# Patient Record
Sex: Female | Born: 1980 | Race: White | Hispanic: Yes | Marital: Married | Smoking: Never smoker
Health system: Southern US, Community
[De-identification: ages and names within clinical notes are randomized; demographics above are authoritative.]

## PROBLEM LIST (undated history)

## (undated) ENCOUNTER — Inpatient Hospital Stay (HOSPITAL_COMMUNITY): Payer: Self-pay

## (undated) DIAGNOSIS — R0600 Dyspnea, unspecified: Secondary | ICD-10-CM

## (undated) DIAGNOSIS — O24419 Gestational diabetes mellitus in pregnancy, unspecified control: Secondary | ICD-10-CM

## (undated) DIAGNOSIS — M25569 Pain in unspecified knee: Secondary | ICD-10-CM

## (undated) DIAGNOSIS — I10 Essential (primary) hypertension: Secondary | ICD-10-CM

## (undated) DIAGNOSIS — G4700 Insomnia, unspecified: Secondary | ICD-10-CM

## (undated) DIAGNOSIS — O039 Complete or unspecified spontaneous abortion without complication: Secondary | ICD-10-CM

## (undated) DIAGNOSIS — T4145XA Adverse effect of unspecified anesthetic, initial encounter: Secondary | ICD-10-CM

## (undated) DIAGNOSIS — E119 Type 2 diabetes mellitus without complications: Secondary | ICD-10-CM

## (undated) DIAGNOSIS — T8859XA Other complications of anesthesia, initial encounter: Secondary | ICD-10-CM

## (undated) DIAGNOSIS — E0590 Thyrotoxicosis, unspecified without thyrotoxic crisis or storm: Secondary | ICD-10-CM

## (undated) HISTORY — DX: Pain in unspecified knee: M25.569

## (undated) HISTORY — DX: Gestational diabetes mellitus in pregnancy, unspecified control: O24.419

## (undated) HISTORY — DX: Type 2 diabetes mellitus without complications: E11.9

---

## 2008-05-21 ENCOUNTER — Emergency Department (HOSPITAL_COMMUNITY): Admission: EM | Admit: 2008-05-21 | Discharge: 2008-05-21 | Payer: Self-pay | Admitting: Emergency Medicine

## 2009-05-05 ENCOUNTER — Emergency Department (HOSPITAL_COMMUNITY): Admission: EM | Admit: 2009-05-05 | Discharge: 2009-05-05 | Payer: Self-pay | Admitting: Emergency Medicine

## 2009-09-19 ENCOUNTER — Emergency Department (HOSPITAL_COMMUNITY): Admission: EM | Admit: 2009-09-19 | Discharge: 2009-09-20 | Payer: Self-pay | Admitting: Emergency Medicine

## 2009-09-21 ENCOUNTER — Emergency Department (HOSPITAL_COMMUNITY): Admission: EM | Admit: 2009-09-21 | Discharge: 2009-09-21 | Payer: Self-pay | Admitting: Emergency Medicine

## 2010-06-01 ENCOUNTER — Emergency Department (HOSPITAL_COMMUNITY): Admission: EM | Admit: 2010-06-01 | Discharge: 2010-06-01 | Payer: Self-pay | Admitting: Emergency Medicine

## 2011-02-10 LAB — COMPREHENSIVE METABOLIC PANEL
ALT: 41 U/L — ABNORMAL HIGH (ref 0–35)
AST: 39 U/L — ABNORMAL HIGH (ref 0–37)
Alkaline Phosphatase: 69 U/L (ref 39–117)
Calcium: 9.3 mg/dL (ref 8.4–10.5)
GFR calc non Af Amer: >60 mL/min (ref >60)
Glucose, Bld: 111 mg/dL — ABNORMAL HIGH (ref 70–99)
Potassium: 3.9 mEq/L (ref 3.5–5.1)
Total Bilirubin: 0.3 mg/dL (ref 0.3–1.2)

## 2011-02-10 LAB — CBC
HCT: 42 % (ref 36.0–46.0)
Hemoglobin: 14.6 g/dL (ref 12.0–15.0)
MCH: 28.3 pg (ref 26.0–34.0)
MCHC: 34.7 g/dL (ref 30.0–36.0)
MCV: 81.6 fL (ref 78.0–100.0)
Platelets: 378 K/uL (ref 150–400)
RBC: 5.15 MIL/uL — ABNORMAL HIGH (ref 3.87–5.11)
RDW: 13.2 % (ref 11.5–15.5)
WBC: 10.8 10*3/uL — ABNORMAL HIGH (ref 4.0–10.5)

## 2011-02-10 LAB — URINALYSIS, ROUTINE W REFLEX MICROSCOPIC
Bilirubin Urine: NEGATIVE
Glucose, UA: NEGATIVE mg/dL
Hgb urine dipstick: NEGATIVE
Ketones, ur: NEGATIVE mg/dL
Nitrite: NEGATIVE
Protein, ur: NEGATIVE mg/dL
Specific Gravity, Urine: 1.025 (ref 1.005–1.030)
Urobilinogen, UA: 1 mg/dL (ref 0.0–1.0)
pH: 6 (ref 5.0–8.0)

## 2011-02-10 LAB — COMPREHENSIVE METABOLIC PANEL WITH GFR
Albumin: 4 g/dL (ref 3.5–5.2)
BUN: 14 mg/dL (ref 6–23)
CO2: 26 meq/L (ref 19–32)
Chloride: 102 meq/L (ref 96–112)
Creatinine, Ser: 0.61 mg/dL (ref 0.4–1.2)
GFR calc Af Amer: >60 mL/min (ref >60)
Sodium: 136 meq/L (ref 135–145)
Total Protein: 8.4 g/dL — ABNORMAL HIGH (ref 6.0–8.3)

## 2011-02-10 LAB — DIFFERENTIAL
Basophils Absolute: 0.1 10*3/uL (ref 0.0–0.1)
Basophils Relative: 1 % (ref 0–1)
Eosinophils Absolute: 0.2 K/uL (ref 0.0–0.7)
Eosinophils Relative: 2 % (ref 0–5)
Lymphocytes Relative: 38 % (ref 12–46)
Lymphs Abs: 4.1 10*3/uL — ABNORMAL HIGH (ref 0.7–4.0)
Monocytes Absolute: 0.8 K/uL (ref 0.1–1.0)
Monocytes Relative: 7 % (ref 3–12)
Neutro Abs: 5.7 10*3/uL (ref 1.7–7.7)
Neutrophils Relative %: 53 % (ref 43–77)

## 2011-02-10 LAB — GLUCOSE, CAPILLARY: Glucose-Capillary: 200 mg/dL — ABNORMAL HIGH (ref 70–99)

## 2011-02-10 LAB — LIPASE, BLOOD: Lipase: 20 U/L (ref 11–59)

## 2011-02-10 LAB — PREGNANCY, URINE: Preg Test, Ur: NEGATIVE

## 2011-03-04 LAB — URINALYSIS, ROUTINE W REFLEX MICROSCOPIC
Hgb urine dipstick: NEGATIVE
Ketones, ur: NEGATIVE mg/dL
Protein, ur: NEGATIVE mg/dL
Specific Gravity, Urine: 1.024 (ref 1.005–1.030)
Urobilinogen, UA: 0.2 mg/dL (ref 0.0–1.0)

## 2011-12-29 ENCOUNTER — Emergency Department (HOSPITAL_COMMUNITY)
Admission: EM | Admit: 2011-12-29 | Discharge: 2011-12-29 | Disposition: A | Discharge status: Home / Self Care | Payer: Self-pay | Attending: Emergency Medicine | Admitting: Emergency Medicine

## 2011-12-29 ENCOUNTER — Encounter (HOSPITAL_COMMUNITY): Payer: Self-pay | Admitting: *Deleted

## 2011-12-29 ENCOUNTER — Emergency Department (HOSPITAL_COMMUNITY): Payer: Self-pay

## 2011-12-29 DIAGNOSIS — IMO0002 Reserved for concepts with insufficient information to code with codable children: Secondary | ICD-10-CM | POA: Insufficient documentation

## 2011-12-29 DIAGNOSIS — N949 Unspecified condition associated with female genital organs and menstrual cycle: Secondary | ICD-10-CM | POA: Insufficient documentation

## 2011-12-29 DIAGNOSIS — R109 Unspecified abdominal pain: Secondary | ICD-10-CM | POA: Insufficient documentation

## 2011-12-29 DIAGNOSIS — Z3201 Encounter for pregnancy test, result positive: Secondary | ICD-10-CM | POA: Insufficient documentation

## 2011-12-29 DIAGNOSIS — R10819 Abdominal tenderness, unspecified site: Secondary | ICD-10-CM | POA: Insufficient documentation

## 2011-12-29 HISTORY — DX: Essential (primary) hypertension: I10

## 2011-12-29 HISTORY — DX: Complete or unspecified spontaneous abortion without complication: O03.9

## 2011-12-29 LAB — URINALYSIS, ROUTINE W REFLEX MICROSCOPIC
Leukocytes, UA: NEGATIVE
Nitrite: NEGATIVE
Protein, ur: NEGATIVE mg/dL
Specific Gravity, Urine: 1.025 (ref 1.005–1.030)

## 2011-12-29 LAB — WET PREP, GENITAL: Trich, Wet Prep: NONE SEEN

## 2011-12-29 LAB — URINE MICROSCOPIC-ADD ON

## 2011-12-29 LAB — PREGNANCY, URINE: Preg Test, Ur: POSITIVE — AB

## 2011-12-29 LAB — ABO/RH: ABO/RH(D): O POS

## 2011-12-29 MED ORDER — PRENATAL RX 60-1 MG PO TABS: 1.0000 | ORAL_TABLET | Freq: Every day | ORAL | Status: AC

## 2011-12-29 NOTE — ED Notes (Signed)
Pt from home c/o lower abdominal pain for about 15 days, denies N/V/D, fever, recent surgeries or injuries. Pt reports taking a pregnancy test 2 days ago that was positive and took another test today that was negative which is when vaginal spotting started.

## 2011-12-29 NOTE — ED Provider Notes (Signed)
History     CSN: [id number]  Arrival date & time 12/29/11  1247   First MD Initiated Contact with Patient 12/29/11 1312      Chief Complaint  Patient presents with  . Abdominal Pain    (Consider location/radiation/quality/duration/timing/severity/associated sxs/prior treatment) The history is provided by the patient. A language interpreter was used.    [id number] 31 year old female with history of prior miscarriage is presenting to the ED with chief complaints of abdominal pain. History was obtained through a language interpreter. Patient states her last menstruation was in mid December. For the past 15 days he has been experiencing low abdominal pain affecting both left and right side. Pain is described as a intermittent throbbing sensation worsening with movement and relief on its own.  2 days ago she had a positive urine pregnancy test. She noticed some vaginal spotting without clots today and she would check a pregnancy test that was negative this time. Patient denies fever, chest pain, shortness of breath, nausea, vomiting, back pain. She does complain of some mild burning urination without urinary frequency or urgency. She denies vaginal discharge. She denies any recent trauma.  Past Medical History  Diagnosis Date  . Miscarriage   . Hypertension     Past Surgical History  Procedure Date  . Cesarean section     History reviewed. No pertinent family history.  History  Substance Use Topics  . Smoking status: Never Smoker   . Smokeless tobacco: Never Used  . Alcohol Use: Yes     socially    OB History    Grav Para Term Preterm Abortions TAB SAB Ect Mult Living                  Review of Systems  All other systems reviewed and are negative.    Allergies  Review of patient's allergies indicates no known allergies.  Home Medications  No current outpatient prescriptions on file.  BP 141/73  Pulse 84  Temp(Src) 98.9 F (37.2 C) (Oral)  Resp 18  Wt 255 lb (115.667  kg)  SpO2 99%  LMP 11/07/2011  Physical Exam  Nursing note and vitals reviewed. Constitutional: She appears well-developed and well-nourished. No distress.       Awake, alert, nontoxic appearance. Morbidly obese  HENT:  Head: Normocephalic and atraumatic.  Eyes: Conjunctivae are normal. Right eye exhibits no discharge. Left eye exhibits no discharge.  Neck: Normal range of motion. Neck supple.  Cardiovascular: Normal rate and regular rhythm.   Pulmonary/Chest: Effort normal and breath sounds normal. No respiratory distress. She exhibits no tenderness.  Abdominal: Soft. There is tenderness in the suprapubic area. There is no rigidity, no rebound, no guarding, no CVA tenderness, no tenderness at McBurney's point and negative Murphy's sign.  Genitourinary: Vagina normal and uterus normal. There is no rash or lesion on the right labia. There is no rash or lesion on the left labia. Cervix exhibits no motion tenderness and no discharge. Right adnexum displays tenderness. Right adnexum displays no mass. Left adnexum displays tenderness. Left adnexum displays no mass. No erythema, tenderness or bleeding around the vagina. No vaginal discharge found.       The cervical os is closed, but noted no of conception. Patient is mildly tender throughout both adnexa difficult to appreciate mass due to large body habitus. Mild vaginal discharge noted.  Musculoskeletal: She exhibits no tenderness.       ROM appears intact, no obvious focal weakness  Lymphadenopathy:  Right: No inguinal adenopathy present.       Left: No inguinal adenopathy present.  Neurological:       Mental status and motor strength appears intact  Skin: No rash noted.  Psychiatric: She has a normal mood and affect.    ED Course  Procedures (including critical care time)  Labs Reviewed - No data to display No results found.   No diagnosis found.  Results for orders placed during the hospital encounter of 12/29/11    URINALYSIS, ROUTINE W REFLEX MICROSCOPIC      Component Value Range   Color, Urine YELLOW  YELLOW    APPearance CLOUDY (*) CLEAR    Specific Gravity, Urine 1.025  1.005 - 1.030    pH 6.0  5.0 - 8.0    Glucose, UA NEGATIVE  NEGATIVE (mg/dL)   Hgb urine dipstick TRACE (*) NEGATIVE    Bilirubin Urine NEGATIVE  NEGATIVE    Ketones, ur NEGATIVE  NEGATIVE (mg/dL)   Protein, ur NEGATIVE  NEGATIVE (mg/dL)   Urobilinogen, UA 1.0  0.0 - 1.0 (mg/dL)   Nitrite NEGATIVE  NEGATIVE    Leukocytes, UA NEGATIVE  NEGATIVE   PREGNANCY, URINE      Component Value Range   Preg Test, Ur POSITIVE (*) NEGATIVE   WET PREP, GENITAL      Component Value Range   Yeast Wet Prep HPF POC NONE SEEN  NONE SEEN    Trich, Wet Prep NONE SEEN  NONE SEEN    Clue Cells Wet Prep HPF POC NONE SEEN  NONE SEEN    WBC, Wet Prep HPF POC MODERATE (*) NONE SEEN   HCG, QUANTITATIVE, PREGNANCY      Component Value Range   hCG, Beta Chain, Quant, S 8595 (*) <5 (mIU/mL)  ABO/RH      Component Value Range   ABO/RH(D) O POS    URINE MICROSCOPIC-ADD ON      Component Value Range   Squamous Epithelial / LPF FEW (*) RARE    WBC, UA 0-2  <3 (WBC/hpf)   RBC / HPF 0-2  <3 (RBC/hpf)   Bacteria, UA RARE  RARE    Urine-Other MUCOUS PRESENT     US Ob Comp Less 14 Wks  12/29/2011  *RADIOLOGY REPORT*  Clinical Data: Abdominal and pelvic pain.  7-week-3-day gestational age by LMP.  OBSTETRIC <14 WK [place] AND TRANSVAGINAL OB US  Technique:  Both transabdominal and transvaginal ultrasound examinations were performed for complete evaluation of the gestation as well as the maternal uterus, adnexal regions, and pelvic cul-de-sac.  Transvaginal technique was performed to assess early pregnancy.  Comparison:  None.  Intrauterine gestational sac:  Visualized/normal in shape. Yolk sac: Visualized Embryo: Visualized Cardiac Activity: Observed Heart Rate: 120 bpm  CRL: 3   mm  5   w  6   d           [place] EDC: 08/24/2012  Maternal uterus/adnexae: No  subchorionic hemorrhage identified.  No fibroids or other uterine mass identified. A 3 cm right ovarian corpus luteum cyst is seen.  Left ovary appears normal.  No adnexal mass or free fluid identified.  IMPRESSION:  1.  Single living IUP measuring 5 weeks 6 days with [place] EDC of 08/24/2012. 2.  No significant maternal uterine or adnexal abnormality identified.  Original Report Authenticated By: Danae Orleans, M.D.   US Ob Transvaginal  12/29/2011  *RADIOLOGY REPORT*  Clinical Data: Abdominal and pelvic pain.  7-week-3-day gestational age by LMP.  OBSTETRIC <14 WK [place] AND TRANSVAGINAL OB US  Technique:  Both transabdominal and transvaginal ultrasound examinations were performed for complete evaluation of the gestation as well as the maternal uterus, adnexal regions, and pelvic cul-de-sac.  Transvaginal technique was performed to assess early pregnancy.  Comparison:  None.  Intrauterine gestational sac:  Visualized/normal in shape. Yolk sac: Visualized Embryo: Visualized Cardiac Activity: Observed Heart Rate: 120 bpm  CRL: 3   mm  5   w  6   d           [place] EDC: 08/24/2012  Maternal uterus/adnexae: No subchorionic hemorrhage identified.  No fibroids or other uterine mass identified. A 3 cm right ovarian corpus luteum cyst is seen.  Left ovary appears normal.  No adnexal mass or free fluid identified.  IMPRESSION:  1.  Single living IUP measuring 5 weeks 6 days with [place] EDC of 08/24/2012. 2.  No significant maternal uterine or adnexal abnormality identified.  Original Report Authenticated By: Danae Orleans, M.D.      MDM  upreg positive.  Tenderneness noted diffusely on pelvic exam.  Wet prep shows moderate WBC but no other positive result.  I have discussed with my attending, who felt that PID is less likely in the setting of pregnancy.  No abx treatment at this time.    4:08 PM Transvaginal ultrasound reveals a single living intrauterine pregnancy estimated to be 5 weeks and 6 days. No other significant  finding or adnexal abnormality identified.  2 were discussed with the patient. Patient will be prescribed multivitamins and a followup with OBGYN.  Pt in no acute distress.        Fayrene Helper, PA-C 12/29/11 1610

## 2011-12-29 NOTE — ED Notes (Signed)
Ultrasound at bedside with patient. 

## 2011-12-29 NOTE — ED Notes (Signed)
Patient given discharge instructions, information, prescriptions, and diet order. Patient states that they adequately understand discharge information given and to return to ED if symptoms return or worsen.    [facility] given contact information

## 2011-12-29 NOTE — ED Notes (Signed)
Pt speaks no [place], [place], NT in to translate.

## 2012-01-01 NOTE — ED Notes (Signed)
Rx changed to Prenatal 27 mg

## 2012-01-02 NOTE — ED Provider Notes (Signed)
Medical screening examination/treatment/procedure(s) were conducted as a shared visit with non-physician practitioner(s) and myself.  I personally evaluated the patient during the encounter Pos preg test, u/s w iup. abd soft nt.   Suzi Roots, MD 01/02/12 [phone]

## 2012-04-06 ENCOUNTER — Other Ambulatory Visit (HOSPITAL_COMMUNITY): Payer: Self-pay | Admitting: Family

## 2012-04-06 DIAGNOSIS — Z0489 Encounter for examination and observation for other specified reasons: Secondary | ICD-10-CM

## 2012-04-06 LAB — OB RESULTS CONSOLE PLATELET COUNT: Platelets: 342 10*3/uL

## 2012-04-06 LAB — OB RESULTS CONSOLE RUBELLA ANTIBODY, IGM: Rubella: IMMUNE

## 2012-04-06 LAB — OB RESULTS CONSOLE HGB/HCT, BLOOD: Hemoglobin: 10.8 g/dL

## 2012-04-06 LAB — OB RESULTS CONSOLE GC/CHLAMYDIA
Chlamydia: NEGATIVE
Gonorrhea: NEGATIVE

## 2012-04-06 LAB — OB RESULTS CONSOLE HIV ANTIBODY (ROUTINE TESTING): HIV: NONREACTIVE

## 2012-04-06 LAB — OB RESULTS CONSOLE RPR: RPR: NONREACTIVE

## 2012-04-10 ENCOUNTER — Ambulatory Visit (HOSPITAL_COMMUNITY)
Admission: RE | Admit: 2012-04-10 | Discharge: 2012-04-10 | Disposition: A | Discharge status: Home / Self Care | Payer: Medicaid Other | Source: Ambulatory Visit | Attending: Family | Admitting: Family

## 2012-04-10 DIAGNOSIS — Z0489 Encounter for examination and observation for other specified reasons: Secondary | ICD-10-CM

## 2012-04-10 DIAGNOSIS — O99810 Abnormal glucose complicating pregnancy: Secondary | ICD-10-CM | POA: Insufficient documentation

## 2012-04-10 DIAGNOSIS — E669 Obesity, unspecified: Secondary | ICD-10-CM | POA: Insufficient documentation

## 2012-04-10 DIAGNOSIS — O34219 Maternal care for unspecified type scar from previous cesarean delivery: Secondary | ICD-10-CM | POA: Insufficient documentation

## 2012-04-10 DIAGNOSIS — O358XX0 Maternal care for other (suspected) fetal abnormality and damage, not applicable or unspecified: Secondary | ICD-10-CM | POA: Insufficient documentation

## 2012-04-10 DIAGNOSIS — Z363 Encounter for antenatal screening for malformations: Secondary | ICD-10-CM | POA: Insufficient documentation

## 2012-04-10 DIAGNOSIS — Z1389 Encounter for screening for other disorder: Secondary | ICD-10-CM | POA: Insufficient documentation

## 2012-04-10 DIAGNOSIS — O99210 Obesity complicating pregnancy, unspecified trimester: Secondary | ICD-10-CM | POA: Insufficient documentation

## 2012-04-10 LAB — US OB DETAIL + 14 WK

## 2012-04-14 DIAGNOSIS — O99810 Abnormal glucose complicating pregnancy: Secondary | ICD-10-CM | POA: Insufficient documentation

## 2012-04-14 DIAGNOSIS — Z98891 History of uterine scar from previous surgery: Secondary | ICD-10-CM | POA: Insufficient documentation

## 2012-04-15 ENCOUNTER — Ambulatory Visit (INDEPENDENT_AMBULATORY_CARE_PROVIDER_SITE_OTHER): Payer: Self-pay | Admitting: Advanced Practice Midwife

## 2012-04-15 ENCOUNTER — Encounter: Payer: Self-pay | Admitting: Advanced Practice Midwife

## 2012-04-15 DIAGNOSIS — R030 Elevated blood-pressure reading, without diagnosis of hypertension: Secondary | ICD-10-CM | POA: Insufficient documentation

## 2012-04-15 DIAGNOSIS — O0990 Supervision of high risk pregnancy, unspecified, unspecified trimester: Secondary | ICD-10-CM | POA: Insufficient documentation

## 2012-04-15 DIAGNOSIS — O99810 Abnormal glucose complicating pregnancy: Secondary | ICD-10-CM

## 2012-04-15 LAB — COMPREHENSIVE METABOLIC PANEL
AST: 16 U/L (ref 0–37)
Albumin: 3.9 g/dL (ref 3.5–5.2)
Alkaline Phosphatase: 60 U/L (ref 39–117)
Calcium: 9.5 mg/dL (ref 8.4–10.5)
Chloride: 102 mEq/L (ref 96–112)
Potassium: 4.2 mEq/L (ref 3.5–5.3)
Sodium: 135 mEq/L (ref 135–145)
Total Protein: 7 g/dL (ref 6.0–8.3)

## 2012-04-15 LAB — POCT URINALYSIS DIP (DEVICE)
Bilirubin Urine: NEGATIVE
Glucose, UA: NEGATIVE mg/dL
Hgb urine dipstick: NEGATIVE
Specific Gravity, Urine: 1.02 (ref 1.005–1.030)
Urobilinogen, UA: 0.2 mg/dL (ref 0.0–1.0)
pH: 5.5 (ref 5.0–8.0)

## 2012-04-15 NOTE — Progress Notes (Signed)
Pulse 104 Patient reports pelvic pain

## 2012-04-15 NOTE — Patient Instructions (Signed)
Gua de planeamiento de la alimentacin para diabticos (Diabetes Meal Planning Guide) La gua de planeamiento de alimentacin para diabticos es una herramienta para ayudarlo a planear sus comidas y colaciones. Es importante para las personas con diabetes controlar sus niveles de International aid/development worker. Elegir los [employer] correctos y las cantidades adecuadas durante el da le ayudar a Technical brewer. Comer bien puede incluso ayudarlo a mejorar la presin sangunea y Barista o Pharmacologist un peso saludable. CUENTE LOS HIDRATOS DE CARBONO CON FACILIDAD Cuando consume hidratos de carbono, stos se transforman en azcar (glucosa). Esto a su vez Counsellor de Production assistant, radio. El conteo de carbohidratos puede ayudarlo a Chief Operating Officer este nivel para que se sienta mejor. Al planear sus alimentos con el conteo de carbohidratos, podr tener ms flexibilidad en lo que come y Physiological scientist con el consumo de alimentos. El conteo de carbohidratos significa simplemente sumar la cantidad total de gramos de carbohidratos a sus comidas o colaciones. Trate de consumir la misma cantidad en cada comida. A continuacin encontrar una lista de 1 porcin o 15 gr. de carbohidratos. A continuacin se enumeran. Pregunte al mdico cuntos gramos de carbohidratos necesita comer en cada comida o colacin. Almidones y granos  1 [place] de pan.    bollo ingls o bollo para hamburguesa o hotdog.    taza de cereal fro (sin azcar).   ? taza de pasta o arroz cocido.    taza de vegetales que contengan almidn (maz, papas, arvejas, porotos, calabaza).   1 omelette (6 pulgadas).    bollo.   1 waffle o panqueque (del tamao de un CD).    taza de cereales cocidos.   4 a 6 galletas saldas pequeas.  *Se recomienda el consumo de granos enteros. Frutas  1 taza de frutos rojos, meln, papaya o anan sin azcar.   1 fruta fresca pequea.    banana o mango.    taza de jugo de frutas (4 onzas sin  endulzar).    taza de fruta envasada en jugo natural o agua.   2 cucharadas de frutas secas.   12-15 uvas o cerezas.  Leche y yogurt  1 taza de [employer] o al 1%.   1 taza de leche de soja.   6 onzas de yogurt descremado con edulcorante sin azcar.   6 onzas de yogur descremado de soja.   6 onzas de yogur natural.  Vegetales  1 taza de vegetales crudos o  de vegetales cocidos se considera cero carbohidratos o una comida "libre".   Si come 3 o ms porciones en una comida, cuntelas como 1 porcin de carbohidratos.  Otros carbohidratos   onzas de chips o pretzels.    taza de helado de crema o yogur helado.    taza de helado de agua.   5 cm de torta no congelada.   1 cucharada de miel, azcar, mermelada, jalea o almbar.   2 galletitas dulces pequeas.   3 cuadrados de crackers de graham.   3 tazas de palomitas de maz.   6 crackers.   1 taza de caldo.   Cuente 1 taza de guisado u otra mezcla de alimentos como 2 porciones de carbohidratos.   Los alimentos con menos de 20 caloras por porcin deben contarse como cero carbohidratos o alimento "libre".  Si lo desea compre un libro o software de computacin que enumere la cantidad de gramos de carbohidratos de los diferentes alimentos. Adems, el panel nutricional en las etiquetas de los  productos que consume es una buena fuente de informacin. Le indicar el tamao de la porcin y la cantidad total de carbohidratos que consumir por cada una. Divida este nmero por 15 para obtener el nmero de conteo de carbohidratos por porcin. Recuerde: cada porcin son 60 gramos de carbohidratos. PORCIONES La medicin de los alimentos y el tamao de las porciones lo ayudarn a Scientist, physiological cantidad exacta de comida que debe ingerir. La lista que sigue le mostrar el tamao de algunas porciones comunes.   1 onza.................4 dados apilados.   3 onzas..............Marland KitchenUn mazo de cartas.   1 cucharadita...Marland KitchenMarland KitchenLa punta de  un dedo pequeo.   1 cucharada.......Marland KitchenUn dedo.   2 cucharadas....Marland KitchenMarland KitchenUna pelota de golf.    taza..............Marland KitchenLa mitad de un puo.   1 taza...............Marland KitchenUn puo.  EJEMPLO DE PLAN DE ALIMENTACIN PARA DIABTICOS: A continuacin se muestra un ejemplo de plan de alimentacin que incluye comidas de los grupos de granos y [place], Sports administrator, frutas y carnes. Un nutricionista podr confeccionarle un plan individualizado para cubrir sus necesidades calricas y decirle el nmero de porciones que necesita de [place]. Sin embargo, podra [employer] alimentos que contengan carbohidratos (lcteos, cereales y frutas). Controlar la cantidad total de carbohidratos en los alimentos o colaciones es ms importante que asegurarse de incluir todos los grupos alimenticios cada vez que come.  El siguiente plan de alimentacin es un ejemplo de una dieta de 2000 caloras mediante el conteo de carbohidratos. Este plan contiene 17 porciones de carbohidratos. Desayuno  1 taza de avena (2 porciones de carbohidratos).    taza de yogur light(1 porcin de carbohidratos).   1 taza de arndanos (1 porcin de carbohidratos).    taza de almendras.  Colacin  1 manzana grande (2 porciones de carbohidratos).   1 palito de queso bajo [employer].  Almuerzo  Ensalada de pechuga de pollo.   1 taza de espinacas.    taza de tomates cortados.   2 oz (60 gr) de pechuga de pollo en rebanadas.   2 cucharadas de aderezo italiano bajo en [employer].   12 galletas integrales (2 porciones de carbohidratos).   12 a 15 uvas (1 porcin de carbohidratos).   1 taza de [employer] (1porcin de carbohidratos).  Colacin  1 taza de zanahorias.    taza de pur de garbanzos (1 porcin de carbohidratos).  Cena  3 oz (80 gr) de salmn a la parrilla.   1 taza de arroz integral (3 porciones de carbohidratos).  Colacin  1  taza de brcoli al vapor (1 porcin de carbohidrato) con una cucharadita de  aceite de oliva y jugo de limn.   1 taza de budn light (2 porciones de carbohidratos).  HOJA DE PLANEAMIENTO DE LA ALIMENTACIN: El dietista podr utilizar esta hoja para ayudarlo a decidir cuntas porciones y qu tipos de alimentos son los adecuados para usted.  DESAYUNO Grupo de alimentos y porciones / Alimento elegido Granos/Fculas_________________________________________________ Lcteos________________________________________________________ Rufina Falco ______________________________________________________ Lou Miner __________________________________________________________ Charlesetta Ivory _________________________________________________________ Rosalin Hawking _________________________________________________________ Lorin Mercy de alimentos y porciones / Alimento elegido Granos/Fculas___________________________________________________ Lcteos_________________________________________________________ Lou Miner ___________________________________________________________ Charlesetta Ivory __________________________________________________________ Rosalin Hawking __________________________________________________________ [place] de alimentos y porciones / Alimento elegido Granos/Fculas___________________________________________________ Lcteos_________________________________________________________ Lou Miner ___________________________________________________________ Charlesetta Ivory __________________________________________________________ Rosalin Hawking __________________________________________________________ Jettie Pagan de alimentos y porciones / Alimento elegido Granos/Fculas_________________________________________________ Lcteos________________________________________________________ Rufina Falco ______________________________________________________ Lou Miner _________________________________________________________ Charlesetta Ivory ________________________________________________________ Rosalin Hawking  ________________________________________________________ Carolyn Stare DIARIOS Fculas_______________________________________________________ Vegetales _____________________________________________________ Lou Miner ________________________________________________________ Lcteos_______________________________________________________ Carnes________________________________________________________ Rosalin Hawking ________________________________________________________ Document Released: 02/18/2008 Document Revised: 10/31/2011 ExitCare Patient Information 2012 [place] [place].Diabetes mellitus gestacional (Gestational Diabetes Mellitus) La diabetes mellitus gestacional se produce slo  durante el embarazo. Aparece cuando el organismo no puede controlar adecuadamente la glucosa (azcar) que aumenta en la sangre despus de comer. Durante el [place], se produce una resistencia a la insulina (sensibilidad reducida a la insulina) debido a la liberacin de hormonas por parte de la placenta. Generalmente, el pncreas de una mujer embarazada produce la cantidad suficiente de insulina para vencer esa resistencia. Sin embargo, en la diabetes gestacional, hay insulina pero no cumple su funcin adecuadamente. Si la resistencia es lo suficientemente grave como para que el pncreas no produzca la cantidad de insulina suficiente, la glucosa extra se acumula en la sangre.  Devota Pace RIESGO DE DESARROLLAR DIABETES GESTACIONAL?  Las mujeres con historia de diabetes en la familia.   Las mujeres de ms de [address]   Las que presentan sobrepeso.   Las [employer] pertenecen a ciertos grupos tnicos (latinas, afroamericanas, norteamericanas nativas, asiticas y las originarias de las islas del Pacfico.  QUE PUEDE OCURRIRLE AL BEB? Si el nivel de glucosa en sangre de la madre es demasiado elevado mientras este [place], el nivel extra de azcar pasar por el cordn umbilical hacia el beb. Algunos de los problemas del beb  pueden ser:  Beb demasiado grande: si el nio recibe Chief Strategy Officer, puede aumentar mucho de [place]. Esto puede hacer que sea demasiado grande para nacer por parto normal (vaginal) por lo que ser necesario realizar una cesrea.   Bajo nivel de glucosa (hipoglucemia): el beb produce insulina extra en respuesta a la excesiva cantidad de azcar que obtiene de [employer]. Cuando el beb nace y ya no necesita insulina extra, su nivel de azcar en sangre puede disminuir.   Ictericia (coloracin amarillenta de la piel y los ojos): esto es bastante frecuente en los bebs. La causa es la acumulacin de una sustancia qumica denominada bilirrubina. No siempre es un trastorno grave, pero se observa con frecuencia en los bebs cuyas madres sufren diabetes gestacional.  RIESGOS PARA LA MADRE Las mujeres que han sufrido diabetes gestacional pueden tener ms riesgos para algunos problemas como:  Preeclampsia o toxemia, incluyendo problemas con hipertensin arterial. La presin arterial y los niveles de protenas en la orina deben controlarse con frecuencia.   Infecciones   Parto por cesrea.   Aparicin de diabetes tipo 2 en una etapa posterior de la vida. Alrededor del 30% al 50% sufrir diabetes posteriormente, especialmente las que son obesas.  DIAGNSTICO Las hormonas que causan resistencia a la insulina tienen su mayor nivel alrededor de las 24 a 28 semanas del Psychiatrist. Si se experimentan sntomas, stos son similares a los sntomas que normalmente aparecen durante el embarazo.  La diabetes mellitus gestacional generalmente se diagnostica por medio de un mtodo en dos partes: 1. Despus de la 24 a 28 semanas de Psychiatrist, la mujer debe beber una solucin que contiene glucosa y Education officer, environmental un anlisis de [place]. Si el nivel de glucosa es elevado, la realizarn un segundo [place].  2. La prueba oral de tolerancia a la glucosa, que dura aproximadamente tres horas. Despus de realizar ayuno durante la noche,  se controla nivel de glucosa en sangre. La mujer bebe una solucin que contiene glucosa y Chief Executive Officer realizan anlisis de glucosa en sangre cada hora.  Si la mujer tiene factores de riesgos para la diabetes mellitus gestacional, el mdico podr Programme researcher, broadcasting/film/video anlisis antes de las 24 semanas de [place]. TRATAMIENTO El tratamiento est dirigido a Insurance underwriter en sangre de la madre en un nivel normal y puede incluir:  La planificacin de los alimentos.   Recibir insulina u otro medicamento para Sales executive nivel de glucosa en [place].   La prctica de ejercicios.   Llevar un registro diario de los alimentos que consume.   Control y Engineer, maintenance (IT) de los niveles de glucosa en [place].   Control de los niveles de cetona en la [place], [place] esto ya no se considera necesario en la mayora de los [place].  INSTRUCCIONES PARA EL CUIDADO DOMICILIARIO Mientras est embarazada:  Siga los consejos de su mdico relacionados con los controles prenatales, la planificacin de la comida, la actividad fsica, los [place], vitaminas, los anlisis de sangre y otras pruebas y las actividades fsicas.   Lleve un registro de las comidas, las pruebas de glucosa en sangre y la cantidad de insulina que recibe (si corresponde). Muestre todo al profesional en cada consulta mdica prenatal.   Si sufre diabetes mellitus gestacional, podr tener problemas de hipoglucemia (nivel bajo de glucosa en sangre). Podr sospechar este problema si se siente repentinamente mareada, tiene temblores y/o se siente dbil. Si cree que esto le est ocurriendo, y tiene un medidor de glucosa, mida su nivel de Event organiser. Siga los consejos de su mdico sobre el modo y el momento de tratar su nivel de glucosa en sangre. Generalmente se sigue la regla 15:15 Consuma 15 g de hidratos de carbono, espere 15 minutos y Programmer, systems el nivel de glucosa en [place].Barbara Cower de 15 g de hidratos de carbono son:   1 taza de [employer].     taza de jugo.   3-4 tabletas de glucosa.   5-6 caramelos duros.   1 caja pequea de pasas de uva.    taza de gaseosa comn.   Mantenga una buena higiene para evitar infecciones.   No fume.  SOLICITE ATENCIN MDICA SI:  Observa prdida vaginal con o sin picazn.   Se siente ms dbil o cansada que lo habitual.   Primus [place].   Tiene un aumento de peso repentino, 2,5 kg o ms en una semana.   Pierde peso, 1.5 kg o ms en una semana.   Su nivel de glucosa en sangre es elevado, necesita instrucciones.  SOLICITE ATENCIN MDICA DE INMEDIATO SI:  Sufre una cefalea intensa.   Se marea o pierde el conocimiento   Presenta nuseas o vmitos.   Se siente desorientada confundida.   Sufre convulsiones.   Tiene problemas de visin.   Siente Physiological scientist.   Presenta una hemorragia vaginal abundante.   Tiene contracciones uterinas.   Tiene una prdida importante de lquido por la vagina  DESPUS QUE NACE EL BEB:  Concurra a todos los controles de seguimiento y Clinical biochemist los anlisis de sangre segn las indicaciones de su mdico.   Mantenga un estilo de vida saludable para evitar la diabetes en el futuro. Aqu se incluye:   Siga el plan de alimentacin saludable.   Controle su peso.   Practique actividad fsica y descanse lo necesario.   No fume.   Amamante a su beb mientras pueda. Esto disminuir la probabilidad de que usted y su beb sufran diabetes posteriormente.  Para ms informacin acerca de la diabetes, visite la pgina web de Holiday representative Diabetes Association: [email or web address].cy. Para ms informacin acerca de la diabetes gestacional cite la pgina web del [employer] Obst[employer]ians and Gynecologists en: RentRule.com.[email or web address]. Document Released: 08/21/2005 Document Revised: 10/31/2011 [facility] Patient Information 2012 [place], [place].

## 2012-04-15 NOTE — Progress Notes (Signed)
New OB transfer from [facility] for diabetes.  Is doing some diet therapy at home, but has not had training yet and does not have a meter. Already tries to limit sugar and bread/rice.  Does eat a lot of pineapple. Discussed basic diabetic diet. Discussed usual care for GDM and expected testing. Borderline elevation in BP noted. Will do baseline 24 hr urine, CMET and HgbA1C today/tomorrow. Had Anatomy US last week which was normal (SIUP, Placenta Anterior, Cervix 4.04, Anatomy normal).  Will bring back to HR clinic Monday.

## 2012-04-17 LAB — CREATININE CLEARANCE, URINE, 24 HOUR
Creatinine, 24H Ur: 2145 mg/d — ABNORMAL HIGH (ref 700–1800)
Creatinine, Urine: 95.3 mg/dL

## 2012-04-27 ENCOUNTER — Encounter: Payer: Medicaid Other | Attending: Advanced Practice Midwife | Admitting: Dietician

## 2012-04-27 ENCOUNTER — Encounter: Payer: Self-pay | Admitting: Family Medicine

## 2012-04-27 ENCOUNTER — Ambulatory Visit (INDEPENDENT_AMBULATORY_CARE_PROVIDER_SITE_OTHER): Payer: Self-pay | Admitting: Family Medicine

## 2012-04-27 VITALS — Temp 98.9°F | Wt 272.4 lb

## 2012-04-27 DIAGNOSIS — E119 Type 2 diabetes mellitus without complications: Secondary | ICD-10-CM

## 2012-04-27 DIAGNOSIS — Z98891 History of uterine scar from previous surgery: Secondary | ICD-10-CM

## 2012-04-27 DIAGNOSIS — Z713 Dietary counseling and surveillance: Secondary | ICD-10-CM | POA: Insufficient documentation

## 2012-04-27 DIAGNOSIS — O34219 Maternal care for unspecified type scar from previous cesarean delivery: Secondary | ICD-10-CM

## 2012-04-27 DIAGNOSIS — O99810 Abnormal glucose complicating pregnancy: Secondary | ICD-10-CM | POA: Insufficient documentation

## 2012-04-27 LAB — POCT URINALYSIS DIP (DEVICE)
Glucose, UA: NEGATIVE mg/dL
Ketones, ur: 40 mg/dL — AB
Protein, ur: 30 mg/dL — AB
Specific Gravity, Urine: >=1.03 (ref 1.005–1.030)

## 2012-04-27 NOTE — Progress Notes (Signed)
Diabetes Education:  Completed review of the GDM diet and monitoring with the assistance of Delorise Royals, [place] interpreter.  Provided a True Track Meter kit [id number] EXP: 2013/03/24  1 box strips: [id number] EXP: 2014/04/22 1 Box Lancets [id number] EXP: 2016/03/05.  Completed a return demonstration for blood glucose testing.  Fasting glucose at 11:00 AM was 168 mg/dl.  Instructed to monitor fasting and 2 hr pp blood glucose levels and to bring meter and glucose log to each clinic appointment.  Maggie Willmar Stockinger, RN, RD, CDE

## 2012-04-27 NOTE — Patient Instructions (Signed)
Diabetes mellitus gestacional (Gestational Diabetes Mellitus) La diabetes mellitus gestacional se produce slo durante el embarazo. Aparece cuando el organismo no puede controlar adecuadamente la glucosa (azcar) que aumenta en la sangre despus de comer. Durante el embarazo, se produce una resistencia a la insulina (sensibilidad reducida a la insulina) debido a la liberacin de hormonas por parte de la placenta. Generalmente, el pncreas de una mujer embarazada produce la cantidad suficiente de insulina para vencer esa resistencia. Sin embargo, en la diabetes gestacional, hay insulina pero no cumple su funcin adecuadamente. Si la resistencia es lo suficientemente grave como para que el pncreas no produzca la cantidad de insulina suficiente, la glucosa extra se acumula en la sangre.  QUINES TIENEN RIESGO DE DESARROLLAR DIABETES GESTACIONAL?  Las mujeres con historia de diabetes en la familia.   Las mujeres de ms de 25 aos.   Las que presentan sobrepeso.   Las mujeres que pertenecen a ciertos grupos tnicos (latinas, afroamericanas, norteamericanas nativas, asiticas y las originarias de las islas del [place].  QUE PUEDE OCURRIRLE AL BEB? Si el nivel de glucosa en sangre de la madre es demasiado elevado mientras este embarazada, el nivel extra de azcar pasar por el cordn umbilical hacia el beb. Algunos de los problemas del beb pueden ser:  Beb demasiado grande: si el nio recibe demasiada azcar, puede aumentar mucho de peso. Esto puede hacer que sea demasiado grande para nacer por parto normal (vaginal) por lo que ser necesario realizar una cesrea.   Bajo nivel de glucosa (hipoglucemia): el beb produce insulina extra en respuesta a la excesiva cantidad de azcar que obtiene de la madre. Cuando el beb nace y ya no necesita insulina extra, su nivel de azcar en sangre puede disminuir.   Ictericia (coloracin amarillenta de la piel y los ojos): esto es bastante frecuente en los  bebs. La causa es la acumulacin de una sustancia qumica denominada bilirrubina. No siempre es un trastorno grave, pero se observa con frecuencia en los bebs cuyas madres sufren diabetes gestacional.  RIESGOS PARA LA MADRE Las mujeres que han sufrido diabetes gestacional pueden tener ms riesgos para algunos problemas como:  Preeclampsia o toxemia, incluyendo problemas con hipertensin arterial. La presin arterial y los niveles de protenas en la orina deben controlarse con frecuencia.   Infecciones   Parto por cesrea.   Aparicin de diabetes tipo 2 en una etapa posterior de la vida. Alrededor del 30% al 50% sufrir diabetes posteriormente, especialmente las que son obesas.  DIAGNSTICO Las hormonas que causan resistencia a la insulina tienen su mayor nivel alrededor de las 24 a 28 semanas del embarazo. Si se experimentan sntomas, stos son similares a los sntomas que normalmente aparecen durante el embarazo.  La diabetes mellitus gestacional generalmente se diagnostica por medio de un mtodo en dos partes: 1. Despus de la 24 a 28 semanas de embarazo, la mujer debe beber una solucin que contiene glucosa y realizar un anlisis de sangre. Si el nivel de glucosa es elevado, la realizarn un segundo anlisis.  2. La prueba oral de tolerancia a la glucosa, que dura aproximadamente tres horas. Despus de realizar ayuno durante la noche, se controla nivel de glucosa en sangre. La mujer bebe una solucin que contiene glucosa y le realizan anlisis de glucosa en sangre cada hora.  Si la mujer tiene factores de riesgos para la diabetes mellitus gestacional, el mdico podr indicar el anlisis antes de las 24 semanas de embarazo. TRATAMIENTO El tratamiento est dirigido a mantener la glucosa en   sangre de la madre en un nivel normal y puede incluir:  La planificacin de los alimentos.   Recibir insulina u otro medicamento para controlar el nivel de glucosa en sangre.   La prctica de ejercicios.    Llevar un registro diario de los alimentos que consume.   Control y registro de los niveles de glucosa en sangre.   Control de los niveles de cetona en la orina, aunque esto ya no se considera necesario en la mayora de los embarazos.  INSTRUCCIONES PARA EL CUIDADO DOMICILIARIO Mientras est embarazada:  Siga los consejos de su mdico relacionados con los controles prenatales, la planificacin de la comida, la actividad fsica, los medicamentos, vitaminas, los anlisis de sangre y otras pruebas y las actividades fsicas.   Lleve un registro de las comidas, las pruebas de glucosa en sangre y la cantidad de insulina que recibe (si corresponde). Muestre todo al profesional en cada consulta mdica prenatal.   Si sufre diabetes mellitus gestacional, podr tener problemas de hipoglucemia (nivel bajo de glucosa en sangre). Podr sospechar este problema si se siente repentinamente mareada, tiene temblores y/o se siente dbil. Si cree que esto le est ocurriendo, y tiene un medidor de glucosa, mida su nivel de glucosa en sangre. Siga los consejos de su mdico sobre el modo y el momento de tratar su nivel de glucosa en sangre. Generalmente se sigue la regla 15:15 Consuma 15 g de hidratos de carbono, espere 15 minutos y vuelva controlar el nivel de glucosa en sangre.. Ejemplos de 15 g de hidratos de carbono son:   1 taza de leche descremada.    taza de jugo.   3-4 tabletas de glucosa.   5-6 caramelos duros.   1 caja pequea de pasas de uva.    taza de gaseosa comn.   Mantenga una buena higiene para evitar infecciones.   No fume.  SOLICITE ATENCIN MDICA SI:  Observa prdida vaginal con o sin picazn.   Se siente ms dbil o cansada que lo habitual.   Transpira mucho.   Tiene un aumento de peso repentino, 2,5 kg o ms en una semana.   Pierde peso, 1.5 kg o ms en una semana.   Su nivel de glucosa en sangre es elevado, necesita instrucciones.  SOLICITE ATENCIN MDICA DE  INMEDIATO SI:  Sufre una cefalea intensa.   Se marea o pierde el conocimiento   Presenta nuseas o vmitos.   Se siente desorientada confundida.   Sufre convulsiones.   Tiene problemas de visin.   Siente dolor en el estmago.   Presenta una hemorragia vaginal abundante.   Tiene contracciones uterinas.   Tiene una prdida importante de lquido por la vagina  DESPUS QUE NACE EL BEB:  Concurra a todos los controles de seguimiento y realice los anlisis de sangre segn las indicaciones de su mdico.   Mantenga un estilo de vida saludable para evitar la diabetes en el futuro. Aqu se incluye:   Siga el plan de alimentacin saludable.   Controle su peso.   Practique actividad fsica y descanse lo necesario.   No fume.   Amamante a su beb mientras pueda. Esto disminuir la probabilidad de que usted y su beb sufran diabetes posteriormente.  Para ms informacin acerca de la diabetes, visite la pgina web de la American Diabetes Association: www.americandiabetesassociation.org. Para ms informacin acerca de la diabetes gestacional cite la pgina web del American Congress of Obstetricians and Gynecologists en: www.acog.org. Document Released: 08/21/2005 Document Revised: 10/31/2011 [employer] Patient Information 2012   [employer]. Amamantar al beb (Breastfeeding) LOS BENEFICIOS DE AMAMANTAR Para el beb  La primera leche (calostro ) ayuda al mejor funcionamiento del sistema digestivo del beb.   La leche tiene anticuerpos que provienen de la madre y que ayudan a prevenir las infecciones en el beb.   Hay una menor incidencia de asma, enfermedades alrgicas y SMSI (sndrome de muerte sbita nfantil).   Los nutrientes que contiene la leche materna son mejores que las frmulas para el bibern y favorecen el desarrollo cerebral.   Los bebs amamantados sufren menos gases, clicos y constipacin.  Para la mam  La lactancia materna favorece el desarrollo de un  vnculo muy especial entre la madre y el beb.   Es ms conveniente, siempre disponible a la temperatura adecuada y ms econmica que la leche maternizada.   Consume caloras en la madre y la ayuda a perder el peso ganado durante el embarazo.   Favorece la contraccin del tero a su tamao normal, de manera ms rpida y disminuye las hemorragias luego del parto.   Las madres que amamantan tienen menor riesgo de desarrollar cncer de mama.  AMAMNTELO CON FRECUENCIA  Un beb sano, nacido a trmino, puede amamantarse con tanta frecuencia como cada hora, o espaciar las comidas cada tres horas.   Esta frecuencia variar de un beb a otro. Observe al beb cuando manifieste signos de hambre, antes que regirse por el reloj.   Amamntelo tan seguido como el beb lo solicite, o cuando usted sienta la necesidad de aliviar sus mamas.   Despierte al beb si han pasado 3  4 horas desde la ltima comida.   El amamantamiento frecuente la ayudar a producir ms leche y a prevenir problemas de dolor en los pezones e hinchazn de las mamas.  LA POSICIN DEL BEB PARA AMAMANTARLO  Ya sea que se encuentre acostada o sentada, asegrese que el abdomen del beb enfrente el suyo.   Sostenga la mama con el pulgar por arriba y el resto de los dedos por debajo. Asegrese que sus dedos se encuentren lejos del pezn y de la boca del beb.   Toque suavemente los labios del beb y la mejilla ms cercana a la mama con el dedo o el pezn.   Cuando la boca del beb se abra lo suficiente, introduzca el pezn y la zona oscura que lo rodea tanto como le sea posible dentro de la boca.   Coloque a beb cerca suyo de modo que su nariz y mejillas toquen las mamas al mamar.  LAS COMIDAS  La duracin de cada comida vara de un beb a otro y de una comida a otra.   El beb debe succionar alrededor de dos o tres minutos para que le llegue leche. Esto se denomina "bajada". Por este motivo, permita que el nio se alimente en  cada mama todo lo que desee. Terminar de mamar cuando haya recibido la cantidad adecuada de nutrientes.   Para detener la succin coloque su dedo en la comisura de la boca del nio y deslcelo entre sus encas antes de quitarle la mama de la boca. Esto la ayudar a evitar el dolor en los pezones.  REDUCIR LA CONGESTIN DE LAS MAMAS  Durante la primera semana despus del parto, usted puede experimentar congestin en las mamas. Cuando las mamas estn congestionadas, se sienten calientes, llenas y molestas al tacto. Puede reducir la congestin si:   Lo amamanta frecuentemente, cada 2-3 horas. Las mams que amamantan pronto y con frecuencia   tienen menos problemas de congestin.   Coloque bolsas fras livianas entre cada mamada. Esto ayuda a reducir la hinchazn. Envuelva las bolsas de hielo en una toalla liviana para proteger su piel.   Aplique compresas hmedas calientes sobre la mama durante 5 a 10 minutos antes de amamantar al nio. Esto aumenta la circulacin y ayuda a que la leche fluya.   Masajee suavemente la mama antes y durante la alimentacin.   Asegrese que el nio vaca al menos una mama antes de cambiar de lado.   Use un sacaleche para vaciar la mama si el beb se duerme o no se alimenta bien. Tambin podr quitarse la leche con esta bomba si tiene que volver al trabajo o siente que las mamas estn congestionadas.   Evite los biberones, chupetes o complementar la alimentacin con agua o jugos en lugar de la leche materna.   Verifique que el beb se encuentra en la posicin correcta mientras lo alimenta.   Evite el cansancio, el estrs y la anemia   Use un soutien que sostenga bien sus mamas y evite los que tienen aro.   Consuma una dieta balanceada y beba lquidos en cantidad.  Si sigue estas indicaciones, la congestin debe mejorar en 24 a 48 horas. Si an tiene dificultades, consulte a su asesor en lactancia. TENDR SUFICIENTE LECHE MI BEB? Algunas veces las madres se  preocupan acerca de si sus bebs tendrn la leche suficiente. Puede asegurarse que el beb tiene la leche suficiente si:  El beb succiona y escucha que traga activamente.   El nio se alimenta al menos 8 a 12 veces en 24 horas. Alimntelo hasta que se desprenda por sus propios medios o se quede dormido en la primera mama (al menos durante 10 a 20 minutos), luego ofrzcale el otro lado.   El beb moja 5 a 6 paales descartables (6 a 8 paales de tela) en 24 horas cuando tiene 5  6 das de vida.   Tiene al menos 2-3 deposiciones todos los das en los primeros meses. La leche materna es todo el alimento que el beb necesita. No es necesario que el nio ingiera agua o preparados de bibern. De hecho, para ayudar a que sus mamas produzcan ms leche, lo mejor es no darle al beb suplementos durante las primeras semanas.   La materia fecal debe ser blanda y amarillenta.   El beb debe aumentar 112 a 196 g por semana.  CUDESE Cuide sus mamas del siguiente modo:  Bese o dchese diariamente.   No lave sus pezones con jabn.   Comience a amamantar del lado izquierdo en una comida y del lado derecho en la siguiente.   Notar que aumenta el suministro de leche a los 2 a 5 das despus del parto. Puede sentir algunas molestias por la congestin, lo que hace que sus mamas estn duras y sensibles. La congestin disminuye en 24 a 48 horas. Mientras tanto, aplique toallas hmedas calientes durante 5 a 10 minutos antes de amamantar. Un masaje suave y la extraccin de un poco de leche antes de amamantar ablandarn las mamas y har ms fcil que el beb se agarre. Use un buen sostn y seque al aire los pezones durante 10 a 15 minutos luego de cada alimentacin.   Solo utilice apsitos de algodn.   Utilice lanolina pura sobre los pezones luego de amamantar. No necesita lavarlos luego de alimentar al nio.  Cudese del siguiente modo:   Consuma alimentos bien balanceados y refrigerios nutritivos.      Beba leche, jugos de fruta y agua para satisfacer la sed (alrededor de 8 vasos por da).   Descanse lo suficiente.   Aumente la ingesta de calcio en la dieta (1200mg/da).   Evite los alimentos que usted nota que puedan afectar al beb.  SOLICITE ATENCIN MDICA SI:  Tiene preguntas que formular o dificultades con la alimentacin a pecho.   Necesita ayuda.   Observa una zona dura, roja y que le duele en la zona de la mama, y se acompaa de fiebre de 100.5 F (38.1 C) o ms.   El beb est muy somnoliento como para alimentarse bien o tiene problemas para dormir.   El beb moja menos de 6 paales por da, a partir de los 5 das de vida.   La piel del beb o la parte blanca de sus ojos est ms amarilla de lo que estaba en el hospital.   Se siente deprimida.  Document Released: 11/11/2005 Document Revised: 10/31/2011 [employer] Patient Information 2012 [employer]. 

## 2012-04-27 NOTE — Progress Notes (Signed)
Pulse: 95 Lots of pressure

## 2012-04-27 NOTE — Progress Notes (Signed)
Needs diabetic teaching today-fetal echo Risks of DM discussed. Had CrCl only-needs 24 hour urine repeated next visit for protein--lab had already gotten rid of sample.

## 2012-05-04 ENCOUNTER — Encounter: Payer: Medicaid Other | Admitting: Dietician

## 2012-05-04 ENCOUNTER — Encounter: Payer: Self-pay | Admitting: Family Medicine

## 2012-05-04 ENCOUNTER — Ambulatory Visit (INDEPENDENT_AMBULATORY_CARE_PROVIDER_SITE_OTHER): Payer: Medicaid Other | Admitting: Family Medicine

## 2012-05-04 VITALS — BP 129/79 | Temp 97.5°F | Wt 271.2 lb

## 2012-05-04 DIAGNOSIS — O99810 Abnormal glucose complicating pregnancy: Secondary | ICD-10-CM

## 2012-05-04 DIAGNOSIS — O0990 Supervision of high risk pregnancy, unspecified, unspecified trimester: Secondary | ICD-10-CM

## 2012-05-04 DIAGNOSIS — E119 Type 2 diabetes mellitus without complications: Secondary | ICD-10-CM

## 2012-05-04 LAB — POCT URINALYSIS DIP (DEVICE)
Glucose, UA: 100 mg/dL — AB
Hgb urine dipstick: NEGATIVE
Ketones, ur: 40 mg/dL — AB
Specific Gravity, Urine: 1.025 (ref 1.005–1.030)

## 2012-05-04 MED ORDER — INSULIN NPH (HUMAN) (ISOPHANE) 100 UNIT/ML ~~LOC~~ SUSP: 18.0000 [IU] | Freq: Two times a day (BID) | SUBCUTANEOUS | Status: DC

## 2012-05-04 MED ORDER — "INSULIN SYRINGE 30G X 1/2"" 1 ML MISC": 1.0000 [IU] | Status: DC | PRN

## 2012-05-04 MED ORDER — INSULIN ASPART 100 UNIT/ML ~~LOC~~ SOLN: 25.0000 [IU] | Freq: Three times a day (TID) | SUBCUTANEOUS | Status: DC

## 2012-05-04 NOTE — Patient Instructions (Signed)
Diabetes mellitus gestacional (Gestational Diabetes Mellitus) La diabetes mellitus gestacional se produce slo durante el embarazo. Aparece cuando el organismo no puede controlar adecuadamente la glucosa (azcar) que aumenta en la sangre despus de comer. Durante el embarazo, se produce una resistencia a la insulina (sensibilidad reducida a la insulina) debido a la liberacin de hormonas por parte de la placenta. Generalmente, el pncreas de una mujer embarazada produce la cantidad suficiente de insulina para vencer esa resistencia. Sin embargo, en la diabetes gestacional, hay insulina pero no cumple su funcin adecuadamente. Si la resistencia es lo suficientemente grave como para que el pncreas no produzca la cantidad de insulina suficiente, la glucosa extra se acumula en la sangre.  QUINES TIENEN RIESGO DE DESARROLLAR DIABETES GESTACIONAL?  Las mujeres con historia de diabetes en la familia.   Las mujeres de ms de 25 aos.   Las que presentan sobrepeso.   Las mujeres que pertenecen a ciertos grupos tnicos (latinas, afroamericanas, norteamericanas nativas, asiticas y las originarias de las islas del [employer].  QUE PUEDE OCURRIRLE AL BEB? Si el nivel de glucosa en sangre de la madre es demasiado elevado mientras este embarazada, el nivel extra de azcar pasar por el cordn umbilical hacia el beb. Algunos de los problemas del beb pueden ser:  Beb demasiado grande: si el nio recibe demasiada azcar, puede aumentar mucho de peso. Esto puede hacer que sea demasiado grande para nacer por parto normal (vaginal) por lo que ser necesario realizar una cesrea.   Bajo nivel de glucosa (hipoglucemia): el beb produce insulina extra en respuesta a la excesiva cantidad de azcar que obtiene de la madre. Cuando el beb nace y ya no necesita insulina extra, su nivel de azcar en sangre puede disminuir.   Ictericia (coloracin amarillenta de la piel y los ojos): esto es bastante frecuente en los  bebs. La causa es la acumulacin de una sustancia qumica denominada bilirrubina. No siempre es un trastorno grave, pero se observa con frecuencia en los bebs cuyas madres sufren diabetes gestacional.  RIESGOS PARA LA MADRE Las mujeres que han sufrido diabetes gestacional pueden tener ms riesgos para algunos problemas como:  Preeclampsia o toxemia, incluyendo problemas con hipertensin arterial. La presin arterial y los niveles de protenas en la orina deben controlarse con frecuencia.   Infecciones   Parto por cesrea.   Aparicin de diabetes tipo 2 en una etapa posterior de la vida. Alrededor del 30% al 50% sufrir diabetes posteriormente, especialmente las que son obesas.  DIAGNSTICO Las hormonas que causan resistencia a la insulina tienen su mayor nivel alrededor de las 24 a 28 semanas del embarazo. Si se experimentan sntomas, stos son similares a los sntomas que normalmente aparecen durante el embarazo.  La diabetes mellitus gestacional generalmente se diagnostica por medio de un mtodo en dos partes: 1. Despus de la 24 a 28 semanas de embarazo, la mujer debe beber una solucin que contiene glucosa y realizar un anlisis de sangre. Si el nivel de glucosa es elevado, la realizarn un segundo anlisis.  2. La prueba oral de tolerancia a la glucosa, que dura aproximadamente tres horas. Despus de realizar ayuno durante la noche, se controla nivel de glucosa en sangre. La mujer bebe una solucin que contiene glucosa y le realizan anlisis de glucosa en sangre cada hora.  Si la mujer tiene factores de riesgos para la diabetes mellitus gestacional, el mdico podr indicar el anlisis antes de las 24 semanas de embarazo. TRATAMIENTO El tratamiento est dirigido a mantener la glucosa en   sangre de la madre en un nivel normal y puede incluir:  La planificacin de los alimentos.   Recibir insulina u otro medicamento para controlar el nivel de glucosa en sangre.   La prctica de ejercicios.    Llevar un registro diario de los alimentos que consume.   Control y registro de los niveles de glucosa en sangre.   Control de los niveles de cetona en la orina, aunque esto ya no se considera necesario en la mayora de los embarazos.  INSTRUCCIONES PARA EL CUIDADO DOMICILIARIO Mientras est embarazada:  Siga los consejos de su mdico relacionados con los controles prenatales, la planificacin de la comida, la actividad fsica, los medicamentos, vitaminas, los anlisis de sangre y otras pruebas y las actividades fsicas.   Lleve un registro de las comidas, las pruebas de glucosa en sangre y la cantidad de insulina que recibe (si corresponde). Muestre todo al profesional en cada consulta mdica prenatal.   Si sufre diabetes mellitus gestacional, podr tener problemas de hipoglucemia (nivel bajo de glucosa en sangre). Podr sospechar este problema si se siente repentinamente mareada, tiene temblores y/o se siente dbil. Si cree que esto le est ocurriendo, y tiene un medidor de glucosa, mida su nivel de glucosa en sangre. Siga los consejos de su mdico sobre el modo y el momento de tratar su nivel de glucosa en sangre. Generalmente se sigue la regla 15:15 Consuma 15 g de hidratos de carbono, espere 15 minutos y vuelva controlar el nivel de glucosa en sangre.. Ejemplos de 15 g de hidratos de carbono son:   1 taza de leche descremada.    taza de jugo.   3-4 tabletas de glucosa.   5-6 caramelos duros.   1 caja pequea de pasas de uva.    taza de gaseosa comn.   Mantenga una buena higiene para evitar infecciones.   No fume.  SOLICITE ATENCIN MDICA SI:  Observa prdida vaginal con o sin picazn.   Se siente ms dbil o cansada que lo habitual.   Transpira mucho.   Tiene un aumento de peso repentino, 2,5 kg o ms en una semana.   Pierde peso, 1.5 kg o ms en una semana.   Su nivel de glucosa en sangre es elevado, necesita instrucciones.  SOLICITE ATENCIN MDICA DE  INMEDIATO SI:  Sufre una cefalea intensa.   Se marea o pierde el conocimiento   Presenta nuseas o vmitos.   Se siente desorientada confundida.   Sufre convulsiones.   Tiene problemas de visin.   Siente dolor en el estmago.   Presenta una hemorragia vaginal abundante.   Tiene contracciones uterinas.   Tiene una prdida importante de lquido por la vagina  DESPUS QUE NACE EL BEB:  Concurra a todos los controles de seguimiento y realice los anlisis de sangre segn las indicaciones de su mdico.   Mantenga un estilo de vida saludable para evitar la diabetes en el futuro. Aqu se incluye:   Siga el plan de alimentacin saludable.   Controle su peso.   Practique actividad fsica y descanse lo necesario.   No fume.   Amamante a su beb mientras pueda. Esto disminuir la probabilidad de que usted y su beb sufran diabetes posteriormente.  Para ms informacin acerca de la diabetes, visite la pgina web de la American Diabetes Association: www.americandiabetesassociation.org. Para ms informacin acerca de la diabetes gestacional cite la pgina web del American Congress of Obstetricians and Gynecologists en: www.acog.org. Document Released: 08/21/2005 Document Revised: 10/31/2011 [employer] Patient Information 2012   [employer]. 

## 2012-05-04 NOTE — Progress Notes (Signed)
FBS 124-168 2 hour pp 110-244 Almost all BS are out of range--will start insulin--weight based and with NPH and novolog (0.9) correction for obese in 2nd trimester.

## 2012-05-04 NOTE — Progress Notes (Signed)
Diabetes Education:  Completed insulin instruction with the assistance of the Spanish interpreter Raynelle Fanning.  Provided insulin kit 1/3 cc [id number] EXP: 2016/09/17 and 1/2 cc Lot [id number] EXP; 2017/02/27 Completed return demonstration for drawing up and injecting saline.  Maggie Sonia Stickels, RN, RD, CDE

## 2012-05-04 NOTE — Progress Notes (Signed)
Pulse-88  Edema-feet  Pain-pelvic

## 2012-05-04 NOTE — Progress Notes (Signed)
Fetal Echo scheduled with Dr. Elizebeth Brooking on May 11, 2012 at 230pm. [place] # [id number].

## 2012-05-11 ENCOUNTER — Encounter: Payer: Self-pay | Admitting: Family

## 2012-05-11 ENCOUNTER — Other Ambulatory Visit: Payer: Self-pay | Admitting: Advanced Practice Midwife

## 2012-05-11 ENCOUNTER — Encounter: Payer: Medicaid Other | Admitting: Dietician

## 2012-05-11 ENCOUNTER — Other Ambulatory Visit: Payer: Self-pay | Admitting: Family

## 2012-05-11 ENCOUNTER — Ambulatory Visit (INDEPENDENT_AMBULATORY_CARE_PROVIDER_SITE_OTHER): Payer: Medicaid Other | Admitting: Family

## 2012-05-11 VITALS — BP 104/69 | Temp 97.1°F | Wt 274.5 lb

## 2012-05-11 DIAGNOSIS — O0990 Supervision of high risk pregnancy, unspecified, unspecified trimester: Secondary | ICD-10-CM

## 2012-05-11 DIAGNOSIS — O34219 Maternal care for unspecified type scar from previous cesarean delivery: Secondary | ICD-10-CM

## 2012-05-11 DIAGNOSIS — Z98891 History of uterine scar from previous surgery: Secondary | ICD-10-CM

## 2012-05-11 DIAGNOSIS — R030 Elevated blood-pressure reading, without diagnosis of hypertension: Secondary | ICD-10-CM

## 2012-05-11 DIAGNOSIS — O99810 Abnormal glucose complicating pregnancy: Secondary | ICD-10-CM

## 2012-05-11 LAB — COMPREHENSIVE METABOLIC PANEL
BUN: 6 mg/dL (ref 6–23)
CO2: 24 mEq/L (ref 19–32)
Creat: 0.46 mg/dL — ABNORMAL LOW (ref 0.50–1.10)
Glucose, Bld: 64 mg/dL — ABNORMAL LOW (ref 70–99)
Total Bilirubin: 0.2 mg/dL — ABNORMAL LOW (ref 0.3–1.2)
Total Protein: 6.3 g/dL (ref 6.0–8.3)

## 2012-05-11 LAB — HIV ANTIBODY (ROUTINE TESTING W REFLEX): HIV: NONREACTIVE

## 2012-05-11 LAB — POCT URINALYSIS DIP (DEVICE)
Glucose, UA: NEGATIVE mg/dL
Ketones, ur: NEGATIVE mg/dL
Specific Gravity, Urine: 1.015 (ref 1.005–1.030)

## 2012-05-11 LAB — CBC
HCT: 34.1 % — ABNORMAL LOW (ref 36.0–46.0)
Hemoglobin: 11.3 g/dL — ABNORMAL LOW (ref 12.0–15.0)
WBC: 8.9 10*3/uL (ref 4.0–10.5)

## 2012-05-11 NOTE — Addendum Note (Signed)
Addended by: Doreen Salvage on: 05/11/2012 12:00 PM   Modules accepted: Orders

## 2012-05-11 NOTE — Progress Notes (Signed)
Began insulin on 05/05/12; FBS 81-137, 2hr PP 82-153 (6/24 high; higher in first three days of insulin).  Fetal echo today at 2:30pm; schedule growth ultrasound in one week.  Reports dental pain/cavity>send to [facility] for [facility] referral; rpr, cbc today.

## 2012-05-11 NOTE — Progress Notes (Signed)
Diabetes Education:  Provided 1 box strips Lot: [id number] EXP:2014/08/24  And 1 box lancets Lot:[id number] ExP: 2016/03/05 [facility], RN, RD, CDE

## 2012-05-11 NOTE — Progress Notes (Signed)
Pulse: 83

## 2012-05-11 NOTE — Progress Notes (Signed)
U/S scheduled May 18, 2012 at 1130 am.

## 2012-05-12 LAB — CREATININE CLEARANCE, URINE, 24 HOUR
Creatinine Clearance: 242 mL/min — ABNORMAL HIGH (ref 75–115)
Creatinine, 24H Ur: 1602 mg/d (ref 700–1800)
Creatinine, Urine: 106.8 mg/dL
Creatinine: 0.46 mg/dL — ABNORMAL LOW (ref 0.50–1.10)

## 2012-05-12 LAB — PROTEIN, URINE, 24 HOUR: Protein, Urine: 5 mg/dL

## 2012-05-14 DIAGNOSIS — O0990 Supervision of high risk pregnancy, unspecified, unspecified trimester: Secondary | ICD-10-CM

## 2012-05-18 ENCOUNTER — Ambulatory Visit (HOSPITAL_COMMUNITY)
Admission: RE | Admit: 2012-05-18 | Discharge: 2012-05-18 | Disposition: A | Discharge status: Home / Self Care | Payer: Medicaid Other | Source: Ambulatory Visit | Attending: Family | Admitting: Family

## 2012-05-18 ENCOUNTER — Encounter: Payer: Medicaid Other | Admitting: Dietician

## 2012-05-18 ENCOUNTER — Ambulatory Visit (INDEPENDENT_AMBULATORY_CARE_PROVIDER_SITE_OTHER): Payer: Medicaid Other | Admitting: Obstetrics & Gynecology

## 2012-05-18 VITALS — BP 119/79 | Temp 99.3°F | Wt 273.3 lb

## 2012-05-18 DIAGNOSIS — E119 Type 2 diabetes mellitus without complications: Secondary | ICD-10-CM

## 2012-05-18 DIAGNOSIS — E669 Obesity, unspecified: Secondary | ICD-10-CM | POA: Insufficient documentation

## 2012-05-18 DIAGNOSIS — O99810 Abnormal glucose complicating pregnancy: Secondary | ICD-10-CM

## 2012-05-18 DIAGNOSIS — Z9889 Other specified postprocedural states: Secondary | ICD-10-CM

## 2012-05-18 DIAGNOSIS — O0990 Supervision of high risk pregnancy, unspecified, unspecified trimester: Secondary | ICD-10-CM

## 2012-05-18 DIAGNOSIS — Z98891 History of uterine scar from previous surgery: Secondary | ICD-10-CM

## 2012-05-18 DIAGNOSIS — O34219 Maternal care for unspecified type scar from previous cesarean delivery: Secondary | ICD-10-CM | POA: Insufficient documentation

## 2012-05-18 DIAGNOSIS — R030 Elevated blood-pressure reading, without diagnosis of hypertension: Secondary | ICD-10-CM

## 2012-05-18 DIAGNOSIS — O99210 Obesity complicating pregnancy, unspecified trimester: Secondary | ICD-10-CM

## 2012-05-18 DIAGNOSIS — O10019 Pre-existing essential hypertension complicating pregnancy, unspecified trimester: Secondary | ICD-10-CM | POA: Insufficient documentation

## 2012-05-18 LAB — POCT URINALYSIS DIP (DEVICE)
Ketones, ur: NEGATIVE mg/dL
Protein, ur: 30 mg/dL — AB
Urobilinogen, UA: 1 mg/dL (ref 0.0–1.0)
pH: 6 (ref 5.0–8.0)

## 2012-05-18 MED ORDER — INSULIN NPH (HUMAN) (ISOPHANE) 100 UNIT/ML ~~LOC~~ SUSP: 18.0000 [IU] | Freq: Two times a day (BID) | SUBCUTANEOUS | Status: DC

## 2012-05-18 MED ORDER — "INSULIN SYRINGE 30G X 1/2"" 1 ML MISC": 1.0000 [IU] | Status: DC | PRN

## 2012-05-18 MED ORDER — INSULIN ASPART 100 UNIT/ML ~~LOC~~ SOLN: 25.0000 [IU] | Freq: Three times a day (TID) | SUBCUTANEOUS | Status: DC

## 2012-05-18 NOTE — Progress Notes (Signed)
Patient given tel. number for the dental clinic and a letter to take with her. Patient advised presumptive Medicaid does not cover this appointment and she will have to pay at the time of service.

## 2012-05-18 NOTE — Progress Notes (Signed)
Diabetes Education:  Provided 1 box of strips LOT: [id number] EXp:  2014/07/28.  Maggie Seymore Brodowski, RN, RD, CDE

## 2012-05-18 NOTE — Progress Notes (Signed)
Pulse- 87 

## 2012-05-18 NOTE — Progress Notes (Signed)
Fastings 80-90s, 2hr postprandials 70s-120s.  Scheduled for growth scan today.  Unable to measure fundal height adequately due to obesity.  Will be given dentist referral as per her request.  No other complaints or concerns.  Fetal movement and labor precautions reviewed.

## 2012-05-18 NOTE — Patient Instructions (Addendum)
Return to clinic for any obstetric concerns or go to [facility] for evaluation  

## 2012-05-18 NOTE — Addendum Note (Signed)
Addended by: Jennette Kettle on: 05/18/2012 12:07 PM   Modules accepted: Orders

## 2012-05-20 LAB — CULTURE, OB URINE

## 2012-05-23 ENCOUNTER — Other Ambulatory Visit: Payer: Self-pay | Admitting: Obstetrics & Gynecology

## 2012-06-01 ENCOUNTER — Other Ambulatory Visit: Payer: Self-pay | Admitting: Physician Assistant

## 2012-06-01 ENCOUNTER — Ambulatory Visit (HOSPITAL_COMMUNITY)
Admission: RE | Admit: 2012-06-01 | Discharge: 2012-06-01 | Disposition: A | Discharge status: Home / Self Care | Payer: Self-pay | Source: Ambulatory Visit | Attending: Physician Assistant | Admitting: Physician Assistant

## 2012-06-01 ENCOUNTER — Ambulatory Visit (INDEPENDENT_AMBULATORY_CARE_PROVIDER_SITE_OTHER): Payer: Self-pay | Admitting: Physician Assistant

## 2012-06-01 VITALS — BP 117/70 | Temp 98.3°F | Wt 272.8 lb

## 2012-06-01 DIAGNOSIS — E669 Obesity, unspecified: Secondary | ICD-10-CM | POA: Insufficient documentation

## 2012-06-01 DIAGNOSIS — O0990 Supervision of high risk pregnancy, unspecified, unspecified trimester: Secondary | ICD-10-CM

## 2012-06-01 DIAGNOSIS — O99810 Abnormal glucose complicating pregnancy: Secondary | ICD-10-CM | POA: Insufficient documentation

## 2012-06-01 DIAGNOSIS — O288 Other abnormal findings on antenatal screening of mother: Secondary | ICD-10-CM

## 2012-06-01 DIAGNOSIS — O99210 Obesity complicating pregnancy, unspecified trimester: Secondary | ICD-10-CM | POA: Insufficient documentation

## 2012-06-01 DIAGNOSIS — O10019 Pre-existing essential hypertension complicating pregnancy, unspecified trimester: Secondary | ICD-10-CM | POA: Insufficient documentation

## 2012-06-01 DIAGNOSIS — O368190 Decreased fetal movements, unspecified trimester, not applicable or unspecified: Secondary | ICD-10-CM | POA: Insufficient documentation

## 2012-06-01 DIAGNOSIS — O34219 Maternal care for unspecified type scar from previous cesarean delivery: Secondary | ICD-10-CM | POA: Insufficient documentation

## 2012-06-01 LAB — POCT URINALYSIS DIP (DEVICE)
Bilirubin Urine: NEGATIVE
Ketones, ur: NEGATIVE mg/dL

## 2012-06-01 NOTE — Progress Notes (Signed)
Reports no FM x 2 days. Denies pain, blding or LOF. +FHT: 147 on doppler. No BS log today. Reports FBS ~ 95, 2pp: 115-125. Will increase hs dose to 20 units. Reviewed adding protein to hs snack. Daily kick counts. NST today.

## 2012-06-01 NOTE — Progress Notes (Signed)
Patient seen for follow up BPP.  See report in [facility].  Alpha Gula, MD  Single IUP at 28 0/7 weeks BPP 8/10 (-2 for NR NST) Normal amniotic fluid volume  Recommend follow up as clinically indicated .

## 2012-06-01 NOTE — Progress Notes (Signed)
P= 89 Maria Daniel, interpreter used for check in C/o absent FM x 2-3 days; pelvic pressure especially with ambulation

## 2012-06-01 NOTE — Patient Instructions (Signed)
Mtodo para contar los movimientos fetales (Fetal Movement Counts) Nombre de la paciente: __________________________________________________ Maria Daniel probable de parto:____________________ En los embarazos de alto riesgo se recomienda contar las pataditas, pero tambin es una buena idea que lo hagan todas las [place]. Comience a contarlas a las 28 semanas de embarazo. Los movimientos fetales aumentan luego de una comida Immunologist o de comer o beber algo dulce (el nivel de azcar en la sangre est ms alto). Tambin es importante beber gran cantidad de lquidos (hidratarse bien) antes de contar. Si se recuesta sobre el lado izquierdo mejorar la Designer, industrial/product, o puede sentarse en una silla cmoda con los brazos sobre el abdomen y sin distracciones que la rodeen. CONTANDO  Trate de contar a la [employer] lo haga.   Marque el da y la hora y vea cunto le lleva sentir 10 movimientos (patadas, agitaciones, sacudones, vueltas). Debe sentir al menos 10 movimientos en 2 horas. Probablemente sienta los 10 movimientos en menos de dos horas. Si no los siente, espere una hora y cuente nuevamente. Luego de [employer] tendr un patrn.   Debemos observar si hay cambios en el patrn o no hay suficientes pataditas en 2 horas. Le lleva ms tiempo contar los 10 movimientos?  SOLICITE ATENCIN MDICA SI:  Siente menos de 10 pataditas en 2 horas. Intntelo dos veces.   No siente movimientos durante 1 hora.   El patrn se modifica o le lleva ms tiempo Art gallery manager las 10 pataditas.   Siente que el beb no se mueve como lo hace habitualmente.  Fecha: ____________ Movimientos: ____________ Comienzo hora: ____________ Maria Daniel: ____________ Maria Daniel: ____________ Movimientos: ____________ Comienzo hora: ____________ Maria Daniel: ____________ Maria Daniel: ____________ Movimientos: ____________ Comienzo hora: ____________ Maria Daniel: ____________ Maria Daniel: ____________ Movimientos: ____________ Comienzo hora:  ____________ Maria Daniel: ____________ Maria Daniel: ____________ Movimientos: ____________ Comienzo hora: ____________ Maria Daniel: ____________ Maria Daniel: ____________ Movimientos: ____________ Comienzo hora: ____________ Maria Daniel: ____________ Maria Daniel: ____________ Movimientos: ____________ Comienzo hora: ____________ Maria Daniel: ____________  Maria Daniel: ____________ Movimientos: ____________ Comienzo hora: ____________ Maria Daniel: ____________ Maria Daniel: ____________ Movimientos: ____________ Comienzo hora: ____________ Maria Daniel: ____________ Maria Daniel: ____________ Movimientos: ____________ Comienzo hora: ____________ Maria Daniel: ____________ Maria Daniel: ____________ Movimientos: ____________ Comienzo hora: ____________ Maria Daniel: ____________ Maria Daniel: ____________ Movimientos: ____________ Comienzo hora: ____________ Maria Daniel: ____________ Maria Daniel: ____________ Movimientos: ____________ Comienzo hora: ____________ Maria Daniel: ____________ Maria Daniel: ____________ Movimientos: ____________ Comienzo hora: ____________ Maria Daniel: ____________  Maria Daniel: ____________ Movimientos: ____________ Comienzo hora: ____________ Maria Daniel: ____________ Maria Daniel: ____________ Movimientos: ____________ Comienzo hora: ____________ Maria Daniel: ____________ Maria Daniel: ____________ Movimientos: ____________ Comienzo hora: ____________ Maria Daniel: ____________ Maria Daniel: ____________ Movimientos: ____________ Comienzo hora: ____________ Maria Daniel: ____________ Maria Daniel: ____________ Movimientos: ____________ Comienzo hora: ____________ Maria Daniel: ____________ Maria Daniel: ____________ Movimientos: ____________ Comienzo hora: ____________ Maria Daniel: ____________ Maria Daniel: ____________ Movimientos: ____________ Comienzo hora: ____________ Maria Daniel: ____________  Maria Daniel: ____________ Movimientos: ____________ Comienzo hora: ____________ Maria Daniel: ____________ Maria Daniel: ____________ Movimientos: ____________ Comienzo hora: ____________ Maria Daniel: ____________ Maria Daniel: ____________ Movimientos: ____________  Comienzo hora: ____________ Maria Daniel: ____________ Maria Daniel: ____________ Movimientos: ____________ Comienzo hora: ____________ Maria Daniel: ____________ Maria Daniel: ____________ Movimientos: ____________ Comienzo hora: ____________ Maria Daniel: ____________ Maria Daniel: ____________ Movimientos: ____________ Comienzo hora: ____________ Maria Daniel: ____________ Maria Daniel: ____________ Movimientos: ____________ Comienzo hora: ____________ Maria Daniel: ____________  Maria Daniel: ____________ Movimientos: ____________ Comienzo hora: ____________ Maria Daniel: ____________ Maria Daniel: ____________ Movimientos: ____________ Comienzo hora: ____________ Maria Daniel: ____________ Maria Daniel: ____________ Movimientos: ____________ Comienzo hora: ____________ Maria Daniel: ____________ Maria Daniel: ____________ Movimientos: ____________ Comienzo hora: ____________ Maria Daniel: ____________ Maria Daniel: ____________ Movimientos:  ____________ Comienzo hora: ____________ Maria Daniel: ____________ Maria Daniel: ____________ Movimientos: ____________ Comienzo hora: ____________ Maria Daniel: ____________ Maria Daniel: ____________ Movimientos: ____________ Comienzo hora: ____________ Maria Daniel: ____________  Maria Daniel: ____________ Movimientos: ____________ Comienzo hora: ____________ Maria Daniel: ____________ Maria Daniel: ____________ Movimientos: ____________ Comienzo hora: ____________ Maria Daniel: ____________ Maria Daniel: ____________ Movimientos: ____________ Comienzo hora: ____________ Maria Daniel: ____________ Maria Daniel: ____________ Movimientos: ____________ Comienzo hora: ____________ Maria Daniel: ____________ Maria Daniel: ____________ Movimientos: ____________ Comienzo hora: ____________ Maria Daniel: ____________ Maria Daniel: ____________ Movimientos: ____________ Comienzo hora: ____________ Maria Daniel: ____________ Maria Daniel: ____________ Movimientos: ____________ Comienzo hora: ____________ Maria Daniel: ____________  Maria Daniel: ____________ Movimientos: ____________ Comienzo hora: ____________ Maria Daniel: ____________ Maria Daniel: ____________ Movimientos:  ____________ Comienzo hora: ____________ Maria Daniel: ____________ Maria Daniel: ____________ Movimientos: ____________ Comienzo hora: ____________ Maria Daniel: ____________ Maria Daniel: ____________ Movimientos: ____________ Comienzo hora: ____________ Maria Daniel: ____________ Maria Daniel: ____________ Movimientos: ____________ Comienzo hora: ____________ Maria Daniel: ____________ Maria Daniel: ____________ Movimientos: ____________ Comienzo hora: ____________ Maria Daniel: ____________ Maria Daniel: ____________ Movimientos: ____________ Comienzo hora: ____________ Maria Daniel: ____________  Maria Daniel: ____________ Movimientos: ____________ Comienzo hora: ____________ Maria Daniel: ____________ Maria Daniel: ____________ Movimientos: ____________ Comienzo hora: ____________ Maria Daniel: ____________ Maria Daniel: ____________ Movimientos: ____________ Comienzo hora: ____________ Maria Daniel: ____________ Maria Daniel: ____________ Movimientos: ____________ Comienzo hora: ____________ Maria Daniel: ____________ Maria Daniel: ____________ Movimientos: ____________ Comienzo hora: ____________ Maria Daniel: ____________ Maria Daniel: ____________ Movimientos: ____________ Comienzo hora: ____________ Maria Daniel: ____________ Maria Daniel: ____________ Movimientos: ____________ Comienzo hora: ____________ Fin hora: ____________  Document Released: 02/18/2008 Document Revised: 10/31/2011 ExitCare Patient Information 2012 [place], [place].Diabetes mellitus gestacional (Gestational Diabetes Mellitus) La diabetes mellitus gestacional se produce slo durante el embarazo. Aparece cuando el organismo no puede controlar adecuadamente la glucosa (azcar) que aumenta en la sangre despus de comer. Durante el [place], se produce una resistencia a la insulina (sensibilidad reducida a la insulina) debido a la liberacin de hormonas por parte de la placenta. Generalmente, el pncreas de una mujer embarazada produce la cantidad suficiente de insulina para vencer esa resistencia. Sin embargo, en la diabetes gestacional, hay insulina  pero no cumple su funcin adecuadamente. Si la resistencia es lo suficientemente grave como para que el pncreas no produzca la cantidad de insulina suficiente, la glucosa extra se acumula en la sangre.  Devota Pace RIESGO DE DESARROLLAR DIABETES GESTACIONAL?  Las mujeres con historia de diabetes en la familia.   Las mujeres de ms de [address]   Las que presentan sobrepeso.   Las [employer] pertenecen a ciertos grupos tnicos (latinas, afroamericanas, norteamericanas nativas, asiticas y las originarias de las islas del Pacfico.  QUE PUEDE OCURRIRLE AL BEB? Si el nivel de glucosa en sangre de la madre es demasiado elevado mientras este [place], el nivel extra de azcar pasar por el cordn umbilical hacia el beb. Algunos de los problemas del beb pueden ser:  Beb demasiado grande: si el nio recibe Chief Strategy Officer, puede aumentar mucho de [place]. Esto puede hacer que sea demasiado grande para nacer por parto normal (vaginal) por lo que ser necesario realizar una cesrea.   Bajo nivel de glucosa (hipoglucemia): el beb produce insulina extra en respuesta a la excesiva cantidad de azcar que obtiene de [employer]. Cuando el beb nace y ya no necesita insulina extra, su nivel de azcar en sangre puede disminuir.   Ictericia (coloracin amarillenta de la piel y los ojos): esto es bastante frecuente en los bebs. La causa es la acumulacin de una sustancia qumica denominada bilirrubina. No siempre es un trastorno grave, pero se observa con frecuencia en los bebs cuyas madres sufren diabetes gestacional.  RIESGOS PARA LA MADRE Las mujeres que han sufrido diabetes gestacional pueden tener ms riesgos para algunos problemas como:  Preeclampsia o toxemia, incluyendo problemas con hipertensin arterial. La presin arterial y los niveles de protenas en la orina deben controlarse con frecuencia.   Infecciones   Parto por cesrea.   Aparicin de diabetes tipo 2 en una etapa posterior de  la vida. Alrededor del 30% al 50% sufrir diabetes posteriormente, especialmente las que son obesas.  DIAGNSTICO Las hormonas que causan resistencia a la insulina tienen su mayor nivel alrededor de las 24 a 28 semanas del Psychiatrist. Si se experimentan sntomas, stos son similares a los sntomas que normalmente aparecen durante el embarazo.  La diabetes mellitus gestacional generalmente se diagnostica por medio de un mtodo en dos partes: 1. Despus de la 24 a 28 semanas de Psychiatrist, la mujer debe beber una solucin que contiene glucosa y Education officer, environmental un anlisis de [place]. Si el nivel de glucosa es elevado, la realizarn un segundo [place].  2. La prueba oral de tolerancia a la glucosa, que dura aproximadamente tres horas. Despus de realizar ayuno durante la noche, se controla nivel de glucosa en sangre. La mujer bebe una solucin que contiene glucosa y Chief Executive Officer realizan anlisis de glucosa en sangre cada hora.  Si la mujer tiene factores de riesgos para la diabetes mellitus gestacional, el mdico podr Programme researcher, broadcasting/film/video anlisis antes de las 24 semanas de [place]. TRATAMIENTO El tratamiento est dirigido a Insurance underwriter en sangre de la madre en un nivel normal y puede incluir:  La planificacin de los alimentos.   Recibir insulina u otro medicamento para Sales executive nivel de glucosa en [place].   La prctica de ejercicios.   Llevar un registro diario de los alimentos que consume.   Control y Engineer, maintenance (IT) de los niveles de glucosa en [place].   Control de los niveles de cetona en la [place], [place] esto ya no se considera necesario en la mayora de los [place].  INSTRUCCIONES PARA EL CUIDADO DOMICILIARIO Mientras est embarazada:  Siga los consejos de su mdico relacionados con los controles prenatales, la planificacin de la comida, la actividad fsica, los [place], vitaminas, los anlisis de sangre y otras pruebas y las actividades fsicas.   Lleve un registro de las comidas, las pruebas de  glucosa en sangre y la cantidad de insulina que recibe (si corresponde). Muestre todo al profesional en cada consulta mdica prenatal.   Si sufre diabetes mellitus gestacional, podr tener problemas de hipoglucemia (nivel bajo de glucosa en sangre). Podr sospechar este problema si se siente repentinamente mareada, tiene temblores y/o se siente dbil. Si cree que esto le est ocurriendo, y tiene un medidor de glucosa, mida su nivel de Event organiser. Siga los consejos de su mdico sobre el modo y el momento de tratar su nivel de glucosa en sangre. Generalmente se sigue la regla 15:15 Consuma 15 g de hidratos de carbono, espere 15 minutos y Programmer, systems el nivel de glucosa en [place].Barbara Cower de 15 g de hidratos de carbono son:   1 taza de [employer].    taza de jugo.   3-4 tabletas de glucosa.   5-6 caramelos duros.   1 caja pequea de pasas de uva.    taza de gaseosa comn.   Mantenga una buena higiene para evitar infecciones.   No fume.  SOLICITE ATENCIN MDICA SI:  Observa prdida vaginal con o sin picazn.   Se siente ms dbil o cansada que lo habitual.  [place].   Tiene un aumento de peso repentino, 2,5 kg o ms en una semana.   Pierde peso, 1.5 kg o ms en una semana.   Su nivel de glucosa en sangre es elevado, necesita instrucciones.  SOLICITE ATENCIN MDICA DE INMEDIATO SI:  Sufre una cefalea intensa.   Se marea o pierde el conocimiento   Presenta nuseas o vmitos.   Se siente desorientada confundida.   Sufre convulsiones.   Tiene problemas de visin.   Siente Physiological scientist.   Presenta una hemorragia vaginal abundante.   Tiene contracciones uterinas.   Tiene una prdida importante de lquido por la vagina  DESPUS QUE NACE EL BEB:  Concurra a todos los controles de seguimiento y Clinical biochemist los anlisis de sangre segn las indicaciones de su mdico.   Mantenga un estilo de vida saludable para evitar la diabetes en el  futuro. Aqu se incluye:   Siga el plan de alimentacin saludable.   Controle su peso.   Practique actividad fsica y descanse lo necesario.   No fume.   Amamante a su beb mientras pueda. Esto disminuir la probabilidad de que usted y su beb sufran diabetes posteriormente.  Para ms informacin acerca de la diabetes, visite la pgina web de Holiday representative Diabetes Association: [email or web address].com.cy. Para ms informacin acerca de la diabetes gestacional cite la pgina web del [employer]necologists en: RentRule.com.au. Document Released: 08/21/2005 Document Revised: 10/31/2011 [facility] Patient Information 2012 [place], [place].

## 2012-06-01 NOTE — ED Notes (Signed)
Pt reports no fetal movement in 2-3 days.  Sent from clinic for NST.  Fetal movement audible on NST.

## 2012-06-08 ENCOUNTER — Ambulatory Visit (INDEPENDENT_AMBULATORY_CARE_PROVIDER_SITE_OTHER): Payer: Self-pay | Admitting: Family Medicine

## 2012-06-08 ENCOUNTER — Encounter: Payer: Self-pay | Admitting: Family Medicine

## 2012-06-08 ENCOUNTER — Encounter: Payer: Self-pay | Attending: Advanced Practice Midwife | Admitting: Dietician

## 2012-06-08 VITALS — BP 117/78 | Temp 98.9°F | Wt 271.9 lb

## 2012-06-08 DIAGNOSIS — O99810 Abnormal glucose complicating pregnancy: Secondary | ICD-10-CM | POA: Insufficient documentation

## 2012-06-08 DIAGNOSIS — O34219 Maternal care for unspecified type scar from previous cesarean delivery: Secondary | ICD-10-CM

## 2012-06-08 DIAGNOSIS — Z713 Dietary counseling and surveillance: Secondary | ICD-10-CM | POA: Insufficient documentation

## 2012-06-08 DIAGNOSIS — O99210 Obesity complicating pregnancy, unspecified trimester: Secondary | ICD-10-CM | POA: Insufficient documentation

## 2012-06-08 DIAGNOSIS — Z98891 History of uterine scar from previous surgery: Secondary | ICD-10-CM

## 2012-06-08 LAB — POCT URINALYSIS DIP (DEVICE)
Glucose, UA: NEGATIVE mg/dL
Specific Gravity, Urine: 1.025 (ref 1.005–1.030)

## 2012-06-08 MED ORDER — INSULIN NPH (HUMAN) (ISOPHANE) 100 UNIT/ML ~~LOC~~ SUSP: 20.0000 [IU] | Freq: Two times a day (BID) | SUBCUTANEOUS | Status: DC

## 2012-06-08 NOTE — Progress Notes (Signed)
U/S scheduled June 15, 2012 at 215 pm.

## 2012-06-08 NOTE — Progress Notes (Signed)
FBS 93-103--will increase hs NPH to 22u 2 hour pp 65-152 (6 of 30 out of range) U/s growth next week

## 2012-06-08 NOTE — Patient Instructions (Signed)
Diabetes mellitus gestacional (Gestational Diabetes Mellitus) La diabetes mellitus gestacional se produce slo durante el embarazo. Aparece cuando el organismo no puede controlar adecuadamente la glucosa (azcar) que aumenta en la sangre despus de comer. Durante el embarazo, se produce una resistencia a la insulina (sensibilidad reducida a la insulina) debido a la liberacin de hormonas por parte de la placenta. Generalmente, el pncreas de una mujer embarazada produce la cantidad suficiente de insulina para vencer esa resistencia. Sin embargo, en la diabetes gestacional, hay insulina pero no cumple su funcin adecuadamente. Si la resistencia es lo suficientemente grave como para que el pncreas no produzca la cantidad de insulina suficiente, la glucosa extra se acumula en la sangre.  QUINES TIENEN RIESGO DE DESARROLLAR DIABETES GESTACIONAL?  Las mujeres con historia de diabetes en la familia.   Las mujeres de ms de 25 aos.   Las que presentan sobrepeso.   Las mujeres que pertenecen a ciertos grupos tnicos (latinas, afroamericanas, norteamericanas nativas, asiticas y las originarias de las islas del [place].  QUE PUEDE OCURRIRLE AL BEB? Si el nivel de glucosa en sangre de la madre es demasiado elevado mientras este embarazada, el nivel extra de azcar pasar por el cordn umbilical hacia el beb. Algunos de los problemas del beb pueden ser:  Beb demasiado grande: si el nio recibe demasiada azcar, puede aumentar mucho de peso. Esto puede hacer que sea demasiado grande para nacer por parto normal (vaginal) por lo que ser necesario realizar una cesrea.   Bajo nivel de glucosa (hipoglucemia): el beb produce insulina extra en respuesta a la excesiva cantidad de azcar que obtiene de la madre. Cuando el beb nace y ya no necesita insulina extra, su nivel de azcar en sangre puede disminuir.   Ictericia (coloracin amarillenta de la piel y los ojos): esto es bastante frecuente en los  bebs. La causa es la acumulacin de una sustancia qumica denominada bilirrubina. No siempre es un trastorno grave, pero se observa con frecuencia en los bebs cuyas madres sufren diabetes gestacional.  RIESGOS PARA LA MADRE Las mujeres que han sufrido diabetes gestacional pueden tener ms riesgos para algunos problemas como:  Preeclampsia o toxemia, incluyendo problemas con hipertensin arterial. La presin arterial y los niveles de protenas en la orina deben controlarse con frecuencia.   Infecciones   Parto por cesrea.   Aparicin de diabetes tipo 2 en una etapa posterior de la vida. Alrededor del 30% al 50% sufrir diabetes posteriormente, especialmente las que son obesas.  DIAGNSTICO Las hormonas que causan resistencia a la insulina tienen su mayor nivel alrededor de las 24 a 28 semanas del embarazo. Si se experimentan sntomas, stos son similares a los sntomas que normalmente aparecen durante el embarazo.  La diabetes mellitus gestacional generalmente se diagnostica por medio de un mtodo en dos partes: 1. Despus de la 24 a 28 semanas de embarazo, la mujer debe beber una solucin que contiene glucosa y realizar un anlisis de sangre. Si el nivel de glucosa es elevado, la realizarn un segundo anlisis.  2. La prueba oral de tolerancia a la glucosa, que dura aproximadamente tres horas. Despus de realizar ayuno durante la noche, se controla nivel de glucosa en sangre. La mujer bebe una solucin que contiene glucosa y le realizan anlisis de glucosa en sangre cada hora.  Si la mujer tiene factores de riesgos para la diabetes mellitus gestacional, el mdico podr indicar el anlisis antes de las 24 semanas de embarazo. TRATAMIENTO El tratamiento est dirigido a mantener la glucosa en   sangre de la madre en un nivel normal y puede incluir:  La planificacin de los alimentos.   Recibir insulina u otro medicamento para controlar el nivel de glucosa en sangre.   La prctica de ejercicios.    Llevar un registro diario de los alimentos que consume.   Control y registro de los niveles de glucosa en sangre.   Control de los niveles de cetona en la orina, aunque esto ya no se considera necesario en la mayora de los embarazos.  INSTRUCCIONES PARA EL CUIDADO DOMICILIARIO Mientras est embarazada:  Siga los consejos de su mdico relacionados con los controles prenatales, la planificacin de la comida, la actividad fsica, los medicamentos, vitaminas, los anlisis de sangre y otras pruebas y las actividades fsicas.   Lleve un registro de las comidas, las pruebas de glucosa en sangre y la cantidad de insulina que recibe (si corresponde). Muestre todo al profesional en cada consulta mdica prenatal.   Si sufre diabetes mellitus gestacional, podr tener problemas de hipoglucemia (nivel bajo de glucosa en sangre). Podr sospechar este problema si se siente repentinamente mareada, tiene temblores y/o se siente dbil. Si cree que esto le est ocurriendo, y tiene un medidor de glucosa, mida su nivel de glucosa en sangre. Siga los consejos de su mdico sobre el modo y el momento de tratar su nivel de glucosa en sangre. Generalmente se sigue la regla 15:15 Consuma 15 g de hidratos de carbono, espere 15 minutos y vuelva controlar el nivel de glucosa en sangre.. Ejemplos de 15 g de hidratos de carbono son:   1 taza de leche descremada.    taza de jugo.   3-4 tabletas de glucosa.   5-6 caramelos duros.   1 caja pequea de pasas de uva.    taza de gaseosa comn.   Mantenga una buena higiene para evitar infecciones.   No fume.  SOLICITE ATENCIN MDICA SI:  Observa prdida vaginal con o sin picazn.   Se siente ms dbil o cansada que lo habitual.   Transpira mucho.   Tiene un aumento de peso repentino, 2,5 kg o ms en una semana.   Pierde peso, 1.5 kg o ms en una semana.   Su nivel de glucosa en sangre es elevado, necesita instrucciones.  SOLICITE ATENCIN MDICA DE  INMEDIATO SI:  Sufre una cefalea intensa.   Se marea o pierde el conocimiento   Presenta nuseas o vmitos.   Se siente desorientada confundida.   Sufre convulsiones.   Tiene problemas de visin.   Siente dolor en el estmago.   Presenta una hemorragia vaginal abundante.   Tiene contracciones uterinas.   Tiene una prdida importante de lquido por la vagina  DESPUS QUE NACE EL BEB:  Concurra a todos los controles de seguimiento y realice los anlisis de sangre segn las indicaciones de su mdico.   Mantenga un estilo de vida saludable para evitar la diabetes en el futuro. Aqu se incluye:   Siga el plan de alimentacin saludable.   Controle su peso.   Practique actividad fsica y descanse lo necesario.   No fume.   Amamante a su beb mientras pueda. Esto disminuir la probabilidad de que usted y su beb sufran diabetes posteriormente.  Para ms informacin acerca de la diabetes, visite la pgina web de la American Diabetes Association: www.americandiabetesassociation.org. Para ms informacin acerca de la diabetes gestacional cite la pgina web del American [employer]nec[employer] en: www.acog.org. Document Released: 08/21/2005 Document Revised: 10/31/2011 [employer] Patient Information 2012   [employer]. Embarazo - Tercer trimestre (Pregnancy - Third Trimester) El tercer trimestre del embarazo (los ltimos 3 meses) es el perodo de cambios ms rpidos que atraviesan usted y el beb. El aumento de peso es ms rpido. El beb alcanza un largo de aproximadamente 50 cm (20 pulgadas) y pesa entre 2,700 y 4,500 kg (6 a 10 libras). El beb gana ms tejido graso y ya est listo para la vida fuera del cuerpo de la madre. Mientras estn en el interior, los bebs tienen perodos de sueo y vigilia, succionan el pulgar y tienen hipo. Quizs sienta pequeas contracciones del tero. Este es el falso trabajo de parto. Tambin se las conoce como contracciones de  Braxton-Hicks. Es como una prctica del parto. Los problemas ms habituales de esta etapa del embarazo incluyen mayor dificultad para respirar, hinchazn de las manos y los pies por retencin de lquidos y la necesidad de orinar con ms frecuencia debido a que el tero y el beb presionan sobre la vejiga.  EXAMENES PRENATALES  Durante los exmenes prenatales, deber seguir realizando pruebas de sangre, segn avance el embarazo. Estas pruebas se realizan para controlar su salud y la del beb. Tambin se realizan anlisis de sangre para conocer los niveles de hemoglobina. La anemia (bajo nivel de hemoglobina) es frecuente durante el embarazo. Para prevenirla, se administran hierro y vitaminas. Tambin le harn nuevas pruebas para descartar la diabetes. Podrn repetirle algunas de las pruebas que le hicieron previamente.   En cada visita le medirn el tamao del tero. Es para asegurarse de que el beb se desarrolla correctamente.   Tambin en cada visita la pesarn. Esto se realiza para asegurarse de que aumenta de peso al ritmo indicado y que usted y su beb evolucionan normalmente.   En algunas ocasiones se realiza una ecografa para confirmar el correcto desarrollo y evolucin del beb. Esta prueba se realiza con ondas sonoras inofensivas para el beb, de modo que el profesional pueda calcular con ms precisin la fecha del parto.   Discuta las posibilidades de la anestesia si necesita cesrea.  Algunas veces se realizan pruebas especializadas del lquido amnitico que rodea al beb. Esta prueba se denomina amniocentesis. El lquido amnitico se obtiene introduciendo una aguja en el abdomen (vientre). En ocasiones se lleva a cabo cerca del final del embarazo, si es necesario adelantar el parto. En este caso se realiza para asegurarse de que los pulmones del beb estn lo suficientemente maduros como para que pueda vivir fuera del tero. CAMBIOS QUE OCURREN EN EL TERCER TRIMESTRE DEL EMBARAZO Su  organismo atravesar diferentes cambios durante el embarazo que varan de una persona a otra. Converse con el profesional que la asiste acerca los cambios que usted note y que la preocupen.  Durante el ltimo trimestre probablemente sienta un aumento del apetito. Es normal tener "antojos" de ciertas comidas. Esto vara de una persona a otra y de un embarazo a otro.   Podrn aparecer las primeras estras en las caderas, abdomen y mamas. Estos son cambios normales del cuerpo durante el embarazo. No existen medicamentos ni ejercicios que puedan prevenir estos cambios.   El estreimiento puede tratarse con un laxante o agregando fibra a su dieta. Beber grandes cantidades de lquidos, tomar fibras en forma de verduras, frutas y granos integrales es de gran ayuda.   Tambin es beneficioso practicar actividad fsica. Si ha sido una persona activa hasta el embarazo, podr continuar con la mayora de las actividades durante el mismo. Si ha sido menos activa,   puede ser beneficioso que comience con un programa de ejercicios, como realizar caminatas. Consulte con el profesional que la asiste antes de comenzar un programa de ejercicios.   Evite el consumo de cigarrillos, el alcohol, los medicamentos no prescritos y las "drogas de la calle" durante el embarazo. Estas sustancias qumicas afectan la formacin y el desarrollo del beb. Evite estas sustancias durante todo el embarazo para asegurar el nacimiento de un beb sano.   Dolor de espalda, venas varicosas y hemorroides podran aparecer o empeorar.   Los movimientos del beb pueden ser ms bruscos y aparecer ms a menudo.   Puede que note dificultades para respirar facilmente.   El ombligo podra salrsele hacia afuera.   Puede segregar un lquido amarillento (calostro) de las mamas.   Puede segregar mucus con sangre. Esto normalmente ocurre unos pocos das a una semana antes de que comience el trabajo de parto.  INSTRUCCIONES PARA EL CUIDADO  DOMICILIARIO  La mayor parte de los cuidados que se aconsejan son los mismos que los indicados para las primeras etapas del embarazo. Es importante que concurra a todas las citas con el profesional y siga sus instrucciones con respecto a los medicamentos que deba utilizar, a la actividad fsica y a la dieta.   Durante el embarazo debe obtener nutrientes para usted y para su beb. Consuma alimentos balanceados a intervalos regulares. Elija alimentos como carne, pescado, leche y otros productos lcteos descremados, verduras, frutas, panes integrales y cereales. El profesional le informar cul es el aumento de peso ideal.   Las relaciones sexuales pueden continuarse hasta casi el final del embarazo, si no se presentan otros problemas como prdida prematura (antes de tiempo) de lquido amnitico, hemorragia vaginal o dolor abdominal (en el vientre).   Realice actividad fsica todos los das, si no tiene restricciones. Consulte con el profesional que la asiste si no sabe con certeza si determinados ejercicios son seguros. El mayor aumento de peso se produce en los dos ltimos trimestres del embarazo.   Haga reposo con frecuencia, con las piernas elevadas, o segn lo necesite para evitar los calambres y el dolor de cintura.   Use un buen sostn o como los que se usan para hacer deportes para aliviar la sensibilidad de las mamas. Tambin puede serle til si lo usa mientras duerme. Si pierde calostro, podr utilizar apsitos en el sostn.   No utilice la baera con agua caliente, baos turcos y saunas.   Colquese el cinturn de seguridad cuando conduzca. Este la proteger a usted y al beb en caso de accidente.   Evite comer carne cruda y el contacto con los utensilios y desperdicios de los gatos. Estos elementos contienen grmenes que pueden causar defectos de nacimiento en el beb.   Es fcil perder algo de orina durante el embarazo. Apretar y fortalecer los msculos de la pelvis la ayudar con este  problema. Practique detener la miccin cuando est en el bao. Estos son los mismos msculos que necesita fortalecer. Son tambin los mismos msculos que utiliza cuando trata de evitar los gases. Puede practicar apretando estos msculos diez veces, y repetir esto tres veces por da aproximadamente. Una vez que conozca qu msculos debe contraer, no realice estos ejercicios durante la miccin. Puede favorecerle una infeccin si la orina vuelve hacia atrs.   Pida ayuda si tiene necesidades econmicas, de asesoramiento o nutricionales durante el embarazo. El profesional podr ayudarla con respecto a estas necesidades, o derivarla a otros especialistas.   Practique la ida hasta el   hospital a modo de prueba.   Tome clases prenatales junto con su pareja para comprender, practicar y hacer preguntas acerca del trabajo de parto y el nacimiento.   Prepare la habitacin del beb.   No viaje fuera de la ciudad a menos que sea absolutamente necesario y con el consejo del mdico.   Use slo zapatos bajos sin taco para tener un mejor equilibrio y prevenir cadas.  EL CONSUMO DE MEDICAMENTOS Y DROGAS DURANTE EL EMBARAZO  Contine tomando las vitaminas apropiadas para esta etapa tal como se le indic. Las vitaminas deben contener un miligramo de cido flico y deben suplementarse con hierro. Guarde todas las vitaminas fuera del alcance de los nios. La ingestin de slo un par de vitaminas o comprimidos que contengan hierro pueden ocasionar la muerte en un beb o en un nio pequeo.   Evite el uso de medicamentos, inclusive los de venta libre, que no hayan sido prescritos o indicados por el profesional que la asiste. Algunos medicamentos pueden causar problemas fsicos al beb. Utilice los medicamentos de venta libre o de prescripcin para el dolor, el malestar o la fiebre, segn se lo indique el profesional que lo asiste. No utilice aspirina, ibuprofeno (Motrin, Advil, Nuprin) o naproxeno (Aleve) a menos que  el profesional la autorice.   El alcohol se asocia a cierto nmero de defectos del nacimiento, incluido el sndrome de alcoholismo fetal. Debe evitar el consumo de alcohol en cualquiera de sus formas. El cigarrillo causa nacimientos prematuros y bebs de bajo peso al nacer. Las drogas de la calle son muy nocivas para el beb y estn absolutamente prohibidas. Un beb que nace de una madre adicta, ser adicto al nacer. Ese beb tendr los mismos sntomas de abstinencia que un adulto.   Infrmele al profesional si consume alguna droga.  SOLICITE ATENCIN MDICA SI: Tiene alguna preocupacin durante el embarazo. Es mejor que llame para formular las preguntas si no puede esperar hasta la prxima visita, que sentirse preocupada por ellas.  DECISIONES ACERCA DE LA CIRCUNCISIN Usted puede saber o no cul es el sexo de su beb. Si es un varn, ste es el momento de pensar acerca de la circuncisin. La circuncisin es la extirpacin del prepucio. Esta es la piel que cubre el extremo sensible del pene. No hay un motivo mdico que lo justifique. Generalmente la decisin se toma segn lo que sea popular en ese momento, o se basa en creencias religiosas. Podr conversar estos temas con el profesional que la asiste. SOLICITE ATENCIN MDICA DE INMEDIATO SI:  La temperatura oral se eleva sin motivo por encima de 102 F (38.9 C) o segn le indique el profesional que la asiste.   Tiene una prdida de lquido por la vagina (canal de parto). Si sospecha una ruptura de las membranas, tmese la temperatura y llame al profesional para informarlo sobre esto.   Observa unas pequeas manchas, una hemorragia vaginal o elimina cogulos. Avsele al profesional acerca de la cantidad y de cuntos apsitos est utilizando.   Presenta un olor desagradable en la secrecin vaginal y observa un cambio en el color, de transparente a blanco.   Ha vomitado durante ms de 24 horas.   Presenta escalofros o fiebre.   Comienza a  sentir falta de aire.   Siente ardor al orinar.   Baja o sube ms de 900 g (ms de 2 libras), o segn lo indicado por el profesional que la asiste. Observa que sbitamente se le hinchan el rostro, las manos, los   pies o las piernas.   Presenta dolor abdominal. Las molestias en el ligamento redondo son una causa benigna (no cancerosa) frecuente de dolor abdominal durante el embarazo, pero el profesional que la asiste deber evaluarlo.   Presenta dolor de cabeza intenso que no se alivia.   Si no siente los movimientos del beb durante ms de tres horas. Si piensa que el beb no se mueve tanto como lo haca habitualmente, coma algo que contenga azcar y recustese sobre el lado izquierdo durante una hora. El beb debe moverse al menos 4  5 veces por hora. Comunquese inmediatamente si el beb se mueve menos que lo indicado.   Se cae, se ve involucrada en un accidente automovilstico o sufre algn tipo de traumatismo.   En su hogar hay violencia mental o fsica.  Document Released: 08/21/2005 Document Revised: 10/31/2011 [employer] Patient Information 2012 [employer]. Amamantar al beb (Breastfeeding) LOS BENEFICIOS DE AMAMANTAR Para el beb  La primera leche (calostro ) ayuda al mejor funcionamiento del sistema digestivo del beb.   La leche tiene anticuerpos que provienen de la madre y que ayudan a prevenir las infecciones en el beb.   Hay una menor incidencia de asma, enfermedades alrgicas y SMSI (sndrome de muerte sbita nfantil).   Los nutrientes que contiene la leche materna son mejores que las frmulas para el bibern y favorecen el desarrollo cerebral.   Los bebs amamantados sufren menos gases, clicos y constipacin.  Para la mam  La lactancia materna favorece el desarrollo de un vnculo muy especial entre la madre y el beb.   Es ms conveniente, siempre disponible a la temperatura adecuada y ms econmica que la leche maternizada.   Consume caloras en la madre y  la ayuda a perder el peso ganado durante el embarazo.   Favorece la contraccin del tero a su tamao normal, de manera ms rpida y disminuye las hemorragias luego del parto.   Las madres que amamantan tienen menor riesgo de desarrollar cncer de mama.  AMAMNTELO CON FRECUENCIA  Un beb sano, nacido a trmino, puede amamantarse con tanta frecuencia como cada hora, o espaciar las comidas cada tres horas.   Esta frecuencia variar de un beb a otro. Observe al beb cuando manifieste signos de hambre, antes que regirse por el reloj.   Amamntelo tan seguido como el beb lo solicite, o cuando usted sienta la necesidad de aliviar sus mamas.   Despierte al beb si han pasado 3  4 horas desde la ltima comida.   El amamantamiento frecuente la ayudar a producir ms leche y a prevenir problemas de dolor en los pezones e hinchazn de las mamas.  LA POSICIN DEL BEB PARA AMAMANTARLO  Ya sea que se encuentre acostada o sentada, asegrese que el abdomen del beb enfrente el suyo.   Sostenga la mama con el pulgar por arriba y el resto de los dedos por debajo. Asegrese que sus dedos se encuentren lejos del pezn y de la boca del beb.   Toque suavemente los labios del beb y la mejilla ms cercana a la mama con el dedo o el pezn.   Cuando la boca del beb se abra lo suficiente, introduzca el pezn y la zona oscura que lo rodea tanto como le sea posible dentro de la boca.   Coloque a beb cerca suyo de modo que su nariz y mejillas toquen las mamas al mamar.  LAS COMIDAS  La duracin de cada comida vara de un beb a otro y de   una comida a otra.   El beb debe succionar alrededor de dos o tres minutos para que le llegue leche. Esto se denomina "bajada". Por este motivo, permita que el nio se alimente en cada mama todo lo que desee. Terminar de mamar cuando haya recibido la cantidad adecuada de nutrientes.   Para detener la succin coloque su dedo en la comisura de la boca del nio y  deslcelo entre sus encas antes de quitarle la mama de la boca. Esto la ayudar a evitar el dolor en los pezones.  REDUCIR LA CONGESTIN DE LAS MAMAS  Durante la primera semana despus del parto, usted puede experimentar congestin en las mamas. Cuando las mamas estn congestionadas, se sienten calientes, llenas y molestas al tacto. Puede reducir la congestin si:   Lo amamanta frecuentemente, cada 2-3 horas. Las mams que amamantan pronto y con frecuencia tienen menos problemas de congestin.   Coloque bolsas fras livianas entre cada mamada. Esto ayuda a reducir la hinchazn. Envuelva las bolsas de hielo en una toalla liviana para proteger su piel.   Aplique compresas hmedas calientes sobre la mama durante 5 a 10 minutos antes de amamantar al nio. Esto aumenta la circulacin y ayuda a que la leche fluya.   Masajee suavemente la mama antes y durante la alimentacin.   Asegrese que el nio vaca al menos una mama antes de cambiar de lado.   Use un sacaleche para vaciar la mama si el beb se duerme o no se alimenta bien. Tambin podr quitarse la leche con esta bomba si tiene que volver al trabajo o siente que las mamas estn congestionadas.   Evite los biberones, chupetes o complementar la alimentacin con agua o jugos en lugar de la leche materna.   Verifique que el beb se encuentra en la posicin correcta mientras lo alimenta.   Evite el cansancio, el estrs y la anemia   Use un soutien que sostenga bien sus mamas y evite los que tienen aro.   Consuma una dieta balanceada y beba lquidos en cantidad.  Si sigue estas indicaciones, la congestin debe mejorar en 24 a 48 horas. Si an tiene dificultades, consulte a su asesor en lactancia. TENDR SUFICIENTE LECHE MI BEB? Algunas veces las madres se preocupan acerca de si sus bebs tendrn la leche suficiente. Puede asegurarse que el beb tiene la leche suficiente si:  El beb succiona y escucha que traga activamente.   El nio se  alimenta al menos 8 a 12 veces en 24 horas. Alimntelo hasta que se desprenda por sus propios medios o se quede dormido en la primera mama (al menos durante 10 a 20 minutos), luego ofrzcale el otro lado.   El beb moja 5 a 6 paales descartables (6 a 8 paales de tela) en 24 horas cuando tiene 5  6 das de vida.   Tiene al menos 2-3 deposiciones todos los das en los primeros meses. La leche materna es todo el alimento que el beb necesita. No es necesario que el nio ingiera agua o preparados de bibern. De hecho, para ayudar a que sus mamas produzcan ms leche, lo mejor es no darle al beb suplementos durante las primeras semanas.   La materia fecal debe ser blanda y amarillenta.   El beb debe aumentar 112 a 196 g por semana.  CUDESE Cuide sus mamas del siguiente modo:  Bese o dchese diariamente.   No lave sus pezones con jabn.   Comience a amamantar del lado izquierdo en una comida   y del lado derecho en la siguiente.   Notar que aumenta el suministro de leche a los 2 a 5 das despus del parto. Puede sentir algunas molestias por la congestin, lo que hace que sus mamas estn duras y sensibles. La congestin disminuye en 24 a 48 horas. Mientras tanto, aplique toallas hmedas calientes durante 5 a 10 minutos antes de amamantar. Un masaje suave y la extraccin de un poco de leche antes de amamantar ablandarn las mamas y har ms fcil que el beb se agarre. Use un buen sostn y seque al aire los pezones durante 10 a 15 minutos luego de cada alimentacin.   Solo utilice apsitos de algodn.   Utilice lanolina pura sobre los pezones luego de amamantar. No necesita lavarlos luego de alimentar al nio.  Cudese del siguiente modo:   Consuma alimentos bien balanceados y refrigerios nutritivos.   Beba leche, jugos de fruta y agua para satisfacer la sed (alrededor de 8 vasos por da).   Descanse lo suficiente.   Aumente la ingesta de calcio en la dieta (1200mg/da).   Evite los  alimentos que usted nota que puedan afectar al beb.  SOLICITE ATENCIN MDICA SI:  Tiene preguntas que formular o dificultades con la alimentacin a pecho.   Necesita ayuda.   Observa una zona dura, roja y que le duele en la zona de la mama, y se acompaa de fiebre de 100.5 F (38.1 C) o ms.   El beb est muy somnoliento como para alimentarse bien o tiene problemas para dormir.   El beb moja menos de 6 paales por da, a partir de los 5 das de vida.   La piel del beb o la parte blanca de sus ojos est ms amarilla de lo que estaba en el hospital.   Se siente deprimida.  Document Released: 11/11/2005 Document Revised: 10/31/2011 ExitCare Patient Information 2012 ExitCare, LLC. 

## 2012-06-08 NOTE — Progress Notes (Signed)
Pulse: 84

## 2012-06-15 ENCOUNTER — Ambulatory Visit (INDEPENDENT_AMBULATORY_CARE_PROVIDER_SITE_OTHER): Payer: Self-pay | Admitting: Advanced Practice Midwife

## 2012-06-15 ENCOUNTER — Encounter: Payer: Self-pay | Admitting: Dietician

## 2012-06-15 ENCOUNTER — Ambulatory Visit (HOSPITAL_COMMUNITY)
Admission: RE | Admit: 2012-06-15 | Discharge: 2012-06-15 | Disposition: A | Discharge status: Home / Self Care | Payer: Self-pay | Source: Ambulatory Visit | Attending: Family Medicine | Admitting: Family Medicine

## 2012-06-15 VITALS — BP 124/77 | Temp 97.1°F | Wt 272.2 lb

## 2012-06-15 DIAGNOSIS — Z98891 History of uterine scar from previous surgery: Secondary | ICD-10-CM

## 2012-06-15 DIAGNOSIS — O34219 Maternal care for unspecified type scar from previous cesarean delivery: Secondary | ICD-10-CM

## 2012-06-15 DIAGNOSIS — O10019 Pre-existing essential hypertension complicating pregnancy, unspecified trimester: Secondary | ICD-10-CM | POA: Insufficient documentation

## 2012-06-15 DIAGNOSIS — E669 Obesity, unspecified: Secondary | ICD-10-CM | POA: Insufficient documentation

## 2012-06-15 DIAGNOSIS — O99810 Abnormal glucose complicating pregnancy: Secondary | ICD-10-CM

## 2012-06-15 DIAGNOSIS — O99210 Obesity complicating pregnancy, unspecified trimester: Secondary | ICD-10-CM | POA: Insufficient documentation

## 2012-06-15 LAB — POCT URINALYSIS DIP (DEVICE)
Bilirubin Urine: NEGATIVE
Glucose, UA: NEGATIVE mg/dL
Ketones, ur: NEGATIVE mg/dL
Nitrite: NEGATIVE
pH: 6.5 (ref 5.0–8.0)

## 2012-06-15 MED ORDER — INSULIN NPH (HUMAN) (ISOPHANE) 100 UNIT/ML ~~LOC~~ SUSP: 20.0000 [IU] | Freq: Two times a day (BID) | SUBCUTANEOUS | Status: DC

## 2012-06-15 NOTE — Progress Notes (Signed)
Pulse: 86

## 2012-06-15 NOTE — Progress Notes (Signed)
Diabetes Educator:  Provided 1 box of strips ZOX:[id number] and Exp: 2014/07/28 [facility] RN, RD, CDE

## 2012-06-15 NOTE — Progress Notes (Signed)
Fasting 97-102, 2 hour PC 6/20 slightly high. Increased NPH to 22 Units @ HS as directed after last visit. Will increase NPH to 24 Units @ HS. [place] this afternoon. BPP in 1 week.  Discussed VBAC vs Repeat C/S. Will attempt VBAC if SOL prior to 39 weeks. Consent signed.

## 2012-06-15 NOTE — Patient Instructions (Addendum)
Parto vaginal luego de una cesrea (Vaginal Birth After Cesarean Delivery) Un parto vaginal luego de un parto por cesrea es dar a luz por la vagina luego de haber dado a luz por medio de una intervencin [place]. En el pasado, si una mujer tena un beb por cesrea, todos los partos posteriores deban hacerse por cesrea. Esto ya no es as. Puede ser seguro para la mam intentar un parto vaginal luego de una cesrea. La decisin final de tener un parto vaginal o por cesrea debe tomarse en conjunto, entre la paciente y el mdico. [place] riesgos y los beneficios debern evaluarse con relacin a los motivos y al tipo de cesrea previa LAS MUJERES QUE QUIEREN TENER UN PARTO VAGINAL, DEBEN CONSULTAR CON SU MDICO PARA ASEGURARSE QUE:  La cesrea anterior se realiz con una incisin uterina transversal (no con una incisin vertical clsica).   El canal de parto es lo suficientemente grande como para que pase el [place].   No ha sido sometida a otras operaciones del tero.   Durante el trabajo de parto le realizarn un monitoreo electrnico fetal, en todo momento.   Es necesario que haya un quirfano disponible y listo en caso de necesitar una cesrea de emergencia.   Un cirujano y personal de quirfano estarn disponibles en todo momento durante el [place] de parto, para realizar una cesrea en caso de ser necesario.   Habr un anestesista disponible en caso de necesitar una cesrea de emergencia.   La nursery est lista cuenta con personal especializado y el equipo disponible para cuidar al beb en caso de emergencia.  BENEFICIOS  Permanencia ms breve en el hospital.   Menores costos en el parto, la nurse y el hospital.   Menos prdida de sangre y menos probabilidad de necesitar una transfusin sangunea.   Menos probabilidad de tener fiebre o molestias como consecuencia de una ciruga mayor   Menos riesgo de cogulos sanguneos.   Menos riesgo de sufrir infecciones.   Recuperacin ms  rpida luego del alta mdica.   Menos riesgo de complicaciones quirrgicas, como apertura o hernia de la incisin.   Disminucin del riesgo de lesiones a otros rganos   Menor riesgo de remocin del tero (histerectoma)   Menor riesgo de que la placenta cubra parcial o completamente la abertura del tero (placenta previa) en embarazos futuros   Posibilidad de tener una familia grande, si lo desea.  RIESGOS  Ruptura del tero.   Si el tero se rompe Production manager.   Todas las complicaciones de [place] mayor y lesiones en otros rganos.   Hemorragia excesiva, cogulos e infeccin.   [place] Puntuacin Apgar baja (mtodo que evala al recin nacido segn su apariencia, pulso, muecas, actividad y respiracion) y ms riesgos para el beb.   Hay mas riesgo de ruptura del tero si se induce o aumenta el trabajo de [place].   Hay un mayor riesgo de ruptura uterina si se usan medicamentos para madurar el cuello.  NO DEBE LLEVARSE A CABO SI:  La cesrea previa se realiz con una incicin vertical (clsica) o con forma de T, o usted no sabe cul de [employer] han practicado.   Ha sufrido ruptura del tero.   Le han practicado una ciruga de tero.   Tiene problemas mdicos u obsttricos.   El beb est en problemas.   Tuvo dos cesreas previas y ningn parto vaginal.  OTRAS COSAS QUE DEBE SABER:  La anestesia peridural es segura.  Es seguro dar vuelta al beb si se encuentra de nalgas (intentar una versin ceflica externa).   Es seguro intentarlo en caso de mellizos.   Los embarazos de ms de 40 semanas no tienen xito con este procedimiento.   Hay un aumento de fracasos en embarazadas obesas.   Hay un aumento del porcentaje de fracasos si el beb pesa 4 Kg o ms.   Hay aumento en el porcentaje de fracasos si el intervalo entre la operacin cesrea y el parto vaginal es de menos de 19 meses.   Hay un aumento en el porcentaje de fracasos si ha  sufrido preeclampsia hipertensin arterial, protenas en la orina e hinchazn del rostro y las extremidades.   El parto vaginal ser muy exitoso si tuvo un parto vaginal previo.   Tambin es [employer] caso que el [place] de parto comience espontneamente antes de la fecha.   El parto vaginal luego de Neomia Dear cesrea es similar a un parto espontneo vaginal normal.  Es importante que converse con su mdico desde comienzos del Psychiatrist de modo que pueda [employer], beneficios y opciones. De este modo tendr tiempo de decidir que es lo mejor en su caso particular en relacin a su parto por cesrea anterior. Hay que tener en cuenta que puede haber cambios en la madre durante el [place], lo que hace necesario cambiar su decisin o la del mdico. Los consejos, preocupaciones y decisiones debern documentarse en la historia clnica y debe ser firmada por todas las partes. Document Released: 04/29/2008 Document Revised: 10/31/2011 [facility] Patient Information 2012 [place], [place].  Parto por cesrea  (Cesarean Delivery) El parto por cesrea es el nacimiento de un feto a travs de un corte (incisin) en el vientre (abdomen) y en el matriz (tero).  HGALE SABER A SU MDICO SOBRE:   Complicaciones del embarazo.   Alergias.   Medicamentos que [place], incluyendo hierbas, gotas oftlmicas, medicamentos de [place] y Control and instrumentation engineer.   Uso de corticoides (por va oral o cremas).   Problemas anteriores debido a anestsicos o a medicamentos que [employer] sensibilidad.   Cirugas anteriores.   Historia de cogulos sanguneos   Problemas hemorrgicos o en la sangre.   Otros problemas de [place].  RIESGOS Y COMPLICACIONES  Hemorragias.   Infeccin.   Cogulos sanguneos.   Lesin en los rganos circundantes.   Problemas con la anestesia.   Lesiones al beb.  ANTES DEL PROCEDIMIENTO   Le insertarn un tubo (catter [employer] vejiga. El catter [place] drena la orina desde la  vejiga hacia [place]. Esto mantendr la vejiga vaca durante la [place].   Le colocarn un catter de acceso intravenoso (IV) en el brazo.   Luego rasurarn la zona del pubis y la parte inferior del abdomen. Esto se realiza para evitar una infeccin en el sitio de la incisin.   Le administrarn un medicamento anticido. Esto impedir que los contenidos cidos del estmago ingresen a los pulmones si vomita durante el procedimiento.   Le podrn dar antibiticos para prevenir infecciones.  PROCEDIMIENTO   Le administrarn un medicamento para adormecer a zona inferior del cuerpo anestesia regional). Si estuviera en trabajo de parto, podrn aplicarle una anestesia epidural, que se utiliza tanto el el [place] de parto como en la cesrea. Puede ser [employer] administren un medicamento que la har dormir (anestesia general), aunque esto no es tan frecuente.   Le harn una incisin en el abdomen que se extiende [employer]. Hay dos tipos  bsicos de incisin:   La incisin horizontal (transversa) Las incisiones horizontales se realizan en la mayor parte de las cesreas de [place].   La incisin vertical (de Jolene Schimke). Esta es la menos frecuente. Se reserva para aquellas mujeres que tienen una complicacin grave (parto prematuro extremo) o bajo situaciones de Associate Professor.   Las incisiones horizontales y Garment/textile technologist pueden utilizarse ambas al [employer]. Sin embargo, no es Air traffic controller.   El beb nacer.  DESPUS DEL PROCEDIMIENTO   Si estuvo despierta durante la [place], podr ver al beb enseguida. Si la duermen, ver al beb tan pronto como despierte.   Podr amamantar a su beb despus del procedimiento.   Podr levantarse y Therapist, art mismo da de la [place]. Si Office manager en cama durante cierto tiempo, recibir ayuda para darse vuelta, toser y Industrial/product designer profundamente despus de la ciruga. Esto ayuda a Environmental health practitioner en los pulmones, como la neumona.   No se levante  de la cama sola la primera vez luego de la [place]. Necesitar ayuda para levantarse de la cama hasta que pueda hacerlo sola.   Podr darse Shaune Spittle el da siguiente a la [place]. Despus que le quiten el vendaje, un enfermero la ayudar a ducharse.   Tendr unas medias compresivas neumticas en los pies o en la zona inferior de las piernas. Se colocan para prevenir cogulos sanguneos. Cuando se levante y camine regularmente, ya no sern necesarias.   Nocruce las piernas al sentarse.   Si elimina cogulos de sangre, gurdelos. Si elimina un cogulo cuando va al bao, por favor no tire la cadena. Llame al enfermero. Comunquele al enfermero si piensa que tiene demasiada hemorragia o que elimina muchos cogulos.   Comience a beber lquidos y a alimentarse segn las indicaciones del mdico. Si el estmago no est en condiciones,comer y beber demasiado pronto puede causar aumento de la hinchazn y la inflamacin del intestino o el abdomen. Esto es [employer].   Le darn medicamentos si los necesita. Dgale a los profesionales si siente dolor. Ellos tratarn de que se sienta cmoda. Tambin le indicarn antibiticos para prevenir una infeccin.   Le quitarn la va intravenosa cuando beba una cantidad razonable de lquido. El catter Foley se retirar cuando se levante y camine.   Si su tipo sanguneo es Rh negativo y el beb es Rh positivo, le darn una inyeccin de inmunoblobulina anti D. Esta inyeccin evita que tenga problemas con el Rh en embarazos futuros. Deber colocarse la inyeccin an si se ha Production assistant, radio las trompas.   Si le permiten llevar al beb a dar un paseo,colquelo en la cunita y empjela. No lleve al beb en sus brazos.  Document Released: 11/11/2005 Document Revised: 10/31/2011 [facility] Patient Information 2012 [place], [place].  Mtodo para contar los movimientos fetales (Fetal Movement Counts) Nombre de la paciente: __________________________________________________  Franco Nones probable de parto:____________________ En los embarazos de alto riesgo se recomienda contar las pataditas, pero tambin es una buena idea que lo hagan todas las [place]. Comience a contarlas a las 28 semanas de embarazo. Los movimientos fetales aumentan luego de una comida Immunologist o de comer o beber algo dulce (el nivel de azcar en la sangre est ms alto). Tambin es importante beber gran cantidad de lquidos (hidratarse bien) antes de contar. Si se recuesta sobre el lado izquierdo mejorar la Designer, industrial/product, o puede sentarse en una silla cmoda con los brazos sobre el abdomen y sin distracciones que la rodeen. CONTANDO  Trate de contar a  la [employer] lo haga.   Marque el da y la hora y vea cunto le lleva sentir 10 movimientos (patadas, agitaciones, sacudones, vueltas). Debe sentir al menos 10 movimientos en 2 horas. Probablemente sienta los 10 movimientos en menos de dos horas. Si no los siente, espere una hora y cuente nuevamente. Luego de [employer] tendr un patrn.   Debemos observar si hay cambios en el patrn o no hay suficientes pataditas en 2 horas. Le lleva ms tiempo contar los 10 movimientos?  SOLICITE ATENCIN MDICA SI:  Siente menos de 10 pataditas en 2 horas. Intntelo dos veces.   No siente movimientos durante 1 hora.   El patrn se modifica o le lleva ms tiempo Art gallery manager las 10 pataditas.   Siente que el beb no se mueve como lo hace habitualmente.  Fecha: ____________ Movimientos: ____________ Comienzo hora: ____________ [name]: ____________ Franco Nones: ____________ Movimientos: ____________ Comienzo hora: ____________ [name]: ____________ Franco Nones: ____________ Movimientos: ____________ Comienzo hora: ____________ [name]: ____________ Franco Nones: ____________ Movimientos: ____________ Comienzo hora: ____________ [name]: ____________ Franco Nones: ____________ Movimientos: ____________ Comienzo hora: ____________ [name]: ____________ Franco Nones:  ____________ Movimientos: ____________ Comienzo hora: ____________ [name]: ____________ Franco Nones: ____________ Movimientos: ____________ Comienzo hora: ____________ [name]: ____________  Franco Nones: ____________ Movimientos: ____________ Comienzo hora: ____________ [name]: ____________ Franco Nones: ____________ Movimientos: ____________ Comienzo hora: ____________ [name]: ____________ Franco Nones: ____________ Movimientos: ____________ Comienzo hora: ____________ [name]: ____________ Franco Nones: ____________ Movimientos: ____________ Comienzo hora: ____________ [name]: ____________ Franco Nones: ____________ Movimientos: ____________ Comienzo hora: ____________ [name]: ____________ Franco Nones: ____________ Movimientos: ____________ Comienzo hora: ____________ [name]: ____________ Franco Nones: ____________ Movimientos: ____________ Comienzo hora: ____________ [name]: ____________  Franco Nones: ____________ Movimientos: ____________ Comienzo hora: ____________ [name]: ____________ Franco Nones: ____________ Movimientos: ____________ Comienzo hora: ____________ [name]: ____________ Franco Nones: ____________ Movimientos: ____________ Comienzo hora: ____________ [name]: ____________ Franco Nones: ____________ Movimientos: ____________ Comienzo hora: ____________ [name]: ____________ Franco Nones: ____________ Movimientos: ____________ Comienzo hora: ____________ [name]: ____________ Franco Nones: ____________ Movimientos: ____________ Comienzo hora: ____________ [name]: ____________ Franco Nones: ____________ Movimientos: ____________ Comienzo hora: ____________ [name]: ____________  Franco Nones: ____________ Movimientos: ____________ Comienzo hora: ____________ [name]: ____________ Franco Nones: ____________ Movimientos: ____________ Comienzo hora: ____________ [name]: ____________ Franco Nones: ____________ Movimientos: ____________ Comienzo hora: ____________ [name]: ____________ Franco Nones: ____________ Movimientos: ____________ Comienzo hora: ____________ [name]:  ____________ Franco Nones: ____________ Movimientos: ____________ Comienzo hora: ____________ [name]: ____________ Franco Nones: ____________ Movimientos: ____________ Comienzo hora: ____________ [name]: ____________ Franco Nones: ____________ Movimientos: ____________ Comienzo hora: ____________ [name]: ____________  Franco Nones: ____________ Movimientos: ____________ Comienzo hora: ____________ [name]: ____________ Franco Nones: ____________ Movimientos: ____________ Comienzo hora: ____________ [name]: ____________ Franco Nones: ____________ Movimientos: ____________ Comienzo hora: ____________ [name]: ____________ Franco Nones: ____________ Movimientos: ____________ Comienzo hora: ____________ [name]: ____________ Franco Nones: ____________ Movimientos: ____________ Comienzo hora: ____________ [name]: ____________ Franco Nones: ____________ Movimientos: ____________ Comienzo hora: ____________ [name]: ____________ Franco Nones: ____________ Movimientos: ____________ Comienzo hora: ____________ [name]: ____________  Franco Nones: ____________ Movimientos: ____________ Comienzo hora: ____________ [name]: ____________ Franco Nones: ____________ Movimientos: ____________ Comienzo hora: ____________ [name]: ____________ Franco Nones: ____________ Movimientos: ____________ Comienzo hora: ____________ [name]: ____________ Franco Nones: ____________ Movimientos: ____________ Comienzo hora: ____________ [name]: ____________ Franco Nones: ____________ Movimientos: ____________ Comienzo hora: ____________ [name]: ____________ Franco Nones: ____________ Movimientos: ____________ Comienzo hora: ____________ [name]: ____________ Franco Nones: ____________ Movimientos: ____________ Comienzo hora: ____________ [name]: ____________  Franco Nones: ____________ Movimientos: ____________ Comienzo hora: ____________ [name]: ____________ Franco Nones: ____________ Movimientos: ____________ Comienzo hora: ____________ [name]: ____________ Franco Nones: ____________ Movimientos: ____________ Comienzo  hora:  ____________ [name]: ____________ Franco Nones: ____________ Movimientos: ____________ Comienzo hora: ____________ [name]: ____________ Franco Nones: ____________ Movimientos: ____________ Comienzo hora: ____________ [name]: ____________ Franco Nones: ____________ Movimientos: ____________ Comienzo hora: ____________ [name]: ____________ Franco Nones: ____________ Movimientos: ____________ Comienzo hora: ____________ [name]: ____________  Franco Nones: ____________ Movimientos: ____________ Comienzo hora: ____________ [name]: ____________ Franco Nones: ____________ Movimientos: ____________ Comienzo hora: ____________ [name]: ____________ Franco Nones: ____________ Movimientos: ____________ Comienzo hora: ____________ [name]: ____________ Franco Nones: ____________ Movimientos: ____________ Comienzo hora: ____________ [name]: ____________ Franco Nones: ____________ Movimientos: ____________ Comienzo hora: ____________ [name]: ____________ Franco Nones: ____________ Movimientos: ____________ Comienzo hora: ____________ [name]: ____________ Franco Nones: ____________ Movimientos: ____________ Comienzo hora: ____________ Fin hora: ____________  Document Released: 02/18/2008 Document Revised: 10/31/2011 ExitCare Patient Information 2012 [place], [place].

## 2012-06-22 ENCOUNTER — Ambulatory Visit (INDEPENDENT_AMBULATORY_CARE_PROVIDER_SITE_OTHER): Payer: Self-pay | Admitting: Obstetrics & Gynecology

## 2012-06-22 ENCOUNTER — Other Ambulatory Visit: Payer: Self-pay

## 2012-06-22 VITALS — BP 119/79 | Temp 97.9°F | Wt 274.9 lb

## 2012-06-22 DIAGNOSIS — O99810 Abnormal glucose complicating pregnancy: Secondary | ICD-10-CM

## 2012-06-22 DIAGNOSIS — O99619 Diseases of the digestive system complicating pregnancy, unspecified trimester: Secondary | ICD-10-CM

## 2012-06-22 LAB — POCT URINALYSIS DIP (DEVICE)
Bilirubin Urine: NEGATIVE
Glucose, UA: NEGATIVE mg/dL
Ketones, ur: NEGATIVE mg/dL
Protein, ur: NEGATIVE mg/dL
Specific Gravity, Urine: 1.02 (ref 1.005–1.030)

## 2012-06-22 MED ORDER — INSULIN NPH (HUMAN) (ISOPHANE) 100 UNIT/ML ~~LOC~~ SUSP: SUBCUTANEOUS | Status: DC

## 2012-06-22 MED ORDER — DOCUSATE SODIUM 100 MG PO CAPS: 100.0000 mg | ORAL_CAPSULE | Freq: Two times a day (BID) | ORAL | Status: AC

## 2012-06-22 NOTE — Progress Notes (Signed)
Pulse- 87 

## 2012-06-22 NOTE — Patient Instructions (Signed)
Constipacin en los adultos  (Constipation in Adults)  Se denomina constipacin al hecho de mover el intestino menos de dos veces por semana. Generalmente las heces son duras. A medida que envejecemos, la constipacin es ms frecuente. Si trata de solucionarlo con laxantes, puede empeorar el problema. Los laxantes utilizados durante largos perodos pueden debilitar los msculos del colon. Esto har que la constipacin empeore gradualmente. Una dieta pobre en fibras, la ingesta insuficiente de lquidos y algunos medicamentos pueden empeorar el problema.  ALGUNOS MEDICAMENTOS QUE PUEDEN CAUSAR CONSTIPACIN SON:   Diurticos.   Boqueadotes de los canales de calcio (medicamento utilizado para controlar la presin arterial y el funcionamiento cardaco.   Narcticos (ciertos medicamentos para el dolor).   Anticolinrgicos.   Antiinflamatorios.   Anticidos que contengan aluminio.  ALGUNOS MEDICAMENTOS QUE PUEDEN CAUSAR CONSTIPACIN SON:    Diabetes.   Enfermedad de Parkinson.   Demencia (la mente no funciona adecuadamente y existen trastornos del pensamiento).   Ictus.   Depresin.   Otras enfermedades que causan dificultades con el metabolismo de sales y lquidos.  INSTRUCCIONES PARA EL CUIDADO DOMICILIARIO   El mejor tratamiento para la constipacin es el que se realiza sin tomar medicamentos. Es importante consumir ms fibras, frutas y vegetales.   Aumente lentamente el consumo de fibras a 25 a 38 gramos por da. Granos integrales, frutas, verduras y legumbres son buenas fuentes de fibra. Un dietista podr ayudarlo a incorporar alimentos altos en fibra en su dieta.   Beba gran cantidad de lquido para mantener la orina de tono claro o color amarillo plido.   Deber aadir un suplemento de fibra en su dieta si no puede recibir la cantidad suficiente a partir de los alimentos.   Aumentar la actividad fsica tambin ayuda a mejorar la regularidad.   Los supositorios, segn lo haya indicado el mdico,  podrn ser de utilidad. Si toma anticidos u otros productos que contengan aluminio o calcio, los que pueden causar constipacin, ser de gran ayuda cambiarlos por productos que contengan magnesio, con la aprobacin del mdico.   Si en el da de hoy le han aplicado un enema, considere que esta slo es una medida temporaria y no debe considerarse como tratamiento para la constipacin de larga data (crnica). Si utiliza enemas durante mucho tiempo, se debilitarn los msculos del colon y la constipacin empeorar.   Tambin deben evitarse en lo posible medidas ms enrgicas, como el consumo de sulfato de magnesio, siempre que sea posible. El sulfato de magnesio puede causar diarrea incontrolable. Sin embargo, si usted es una persona de edad avanzada, estas medidas pueden ser tentadoras. En algunos casos, este tipo de medidas ni siquiera le dan tiempo para llegar al bao.  SOLICITE ATENCIN MDICA DE INMEDIATO SI:   Observa sangre de color rojo brillante en las heces.   El estreimiento persiste durante ms de cuatro das.   Presenta dolor abdominal o rectal junto con el estreimiento.   No parece sentirse mejor.   Tiene preguntas para formular, o alguna preocupacin, o no mejora.  EST SEGURO QUE:    Comprende las instrucciones para el alta mdica.   Controlar su enfermedad.   Solicitar atencin mdica de inmediato segn las indicaciones.  Document Released: 12/01/2007 Document Revised: 10/31/2011  ExitCare Patient Information 2012 ExitCare, LLC.

## 2012-06-22 NOTE — Progress Notes (Signed)
FBS up to 115-135, 2 hr pp up to 170 rarely. Has checked BS 4 am and normal. No hypoglycemia. Increase HS NPH to 28 units from 24. Hard BM, straining, Rx Colace BID

## 2012-06-29 ENCOUNTER — Ambulatory Visit (INDEPENDENT_AMBULATORY_CARE_PROVIDER_SITE_OTHER): Payer: Self-pay | Admitting: Obstetrics and Gynecology

## 2012-06-29 VITALS — BP 112/62 | Temp 97.5°F | Wt 276.9 lb

## 2012-06-29 DIAGNOSIS — O34219 Maternal care for unspecified type scar from previous cesarean delivery: Secondary | ICD-10-CM

## 2012-06-29 DIAGNOSIS — O0990 Supervision of high risk pregnancy, unspecified, unspecified trimester: Secondary | ICD-10-CM

## 2012-06-29 DIAGNOSIS — R030 Elevated blood-pressure reading, without diagnosis of hypertension: Secondary | ICD-10-CM

## 2012-06-29 DIAGNOSIS — Z98891 History of uterine scar from previous surgery: Secondary | ICD-10-CM

## 2012-06-29 DIAGNOSIS — O99210 Obesity complicating pregnancy, unspecified trimester: Secondary | ICD-10-CM

## 2012-06-29 DIAGNOSIS — O99810 Abnormal glucose complicating pregnancy: Secondary | ICD-10-CM

## 2012-06-29 LAB — POCT URINALYSIS DIP (DEVICE)
Ketones, ur: NEGATIVE mg/dL
Protein, ur: NEGATIVE mg/dL

## 2012-06-29 NOTE — Progress Notes (Signed)
CBG f 110-128; 2 hr p B 71-140 (1/7 abnormal); 2hr p L 105-141 (5/7 abnormal); 2hr p D 123-204 (6/7 abnormal).  Patient has been checking CBG at 3 and 4 am and they are maily in the 90's. Patient also increased bedtime NPH to 30 on Wednesday because she did not see a change in her fasting CBG's. Patient also admits to skipping meals when she notices that her CBg's are too elevated. Education provided on portion control and importance of consuming 3 meals a day with snacks in between. Will increase NPH to 22 units in am and 32 units qHS. FM/PTL precautions reviewed. NST reviewed and reactive

## 2012-06-29 NOTE — Progress Notes (Signed)
P= 92 C/o pelvic pain in the mornings

## 2012-07-03 ENCOUNTER — Other Ambulatory Visit: Payer: Self-pay

## 2012-07-06 ENCOUNTER — Ambulatory Visit (INDEPENDENT_AMBULATORY_CARE_PROVIDER_SITE_OTHER): Payer: Self-pay | Admitting: Obstetrics & Gynecology

## 2012-07-06 VITALS — BP 135/82 | Temp 99.1°F | Wt 274.7 lb

## 2012-07-06 DIAGNOSIS — O99810 Abnormal glucose complicating pregnancy: Secondary | ICD-10-CM

## 2012-07-06 LAB — POCT URINALYSIS DIP (DEVICE)
Glucose, UA: NEGATIVE mg/dL
Nitrite: NEGATIVE
Specific Gravity, Urine: 1.025 (ref 1.005–1.030)
Urobilinogen, UA: 1 mg/dL (ref 0.0–1.0)

## 2012-07-06 MED ORDER — INSULIN NPH (HUMAN) (ISOPHANE) 100 UNIT/ML ~~LOC~~ SUSP: SUBCUTANEOUS | Status: DC

## 2012-07-06 NOTE — Progress Notes (Signed)
NST today reactive. FBS 93-136, pp 73-140. Will increase NPH HS to 36 units. No symptoms of hypoglycemia noted  [place] next week 8/19

## 2012-07-06 NOTE — Patient Instructions (Signed)
Diabetes mellitus gestacional (Gestational Diabetes Mellitus) La diabetes mellitus gestacional se produce slo durante el embarazo. Aparece cuando el organismo no puede controlar adecuadamente la glucosa (azcar) que aumenta en la sangre despus de comer. Durante el embarazo, se produce una resistencia a la insulina (sensibilidad reducida a la insulina) debido a la liberacin de hormonas por parte de la placenta. Generalmente, el pncreas de una mujer embarazada produce la cantidad suficiente de insulina para vencer esa resistencia. Sin embargo, en la diabetes gestacional, hay insulina pero no cumple su funcin adecuadamente. Si la resistencia es lo suficientemente grave como para que el pncreas no produzca la cantidad de insulina suficiente, la glucosa extra se acumula en la sangre.  QUINES TIENEN RIESGO DE DESARROLLAR DIABETES GESTACIONAL?  Las mujeres con historia de diabetes en la familia.   Las mujeres de ms de 25 aos.   Las que presentan sobrepeso.   Las mujeres que pertenecen a ciertos grupos tnicos (latinas, afroamericanas, norteamericanas nativas, asiticas y las originarias de las islas del [employer].  QUE PUEDE OCURRIRLE AL BEB? Si el nivel de glucosa en sangre de la madre es demasiado elevado mientras este embarazada, el nivel extra de azcar pasar por el cordn umbilical hacia el beb. Algunos de los problemas del beb pueden ser:  Beb demasiado grande: si el nio recibe demasiada azcar, puede aumentar mucho de peso. Esto puede hacer que sea demasiado grande para nacer por parto normal (vaginal) por lo que ser necesario realizar una cesrea.   Bajo nivel de glucosa (hipoglucemia): el beb produce insulina extra en respuesta a la excesiva cantidad de azcar que obtiene de la madre. Cuando el beb nace y ya no necesita insulina extra, su nivel de azcar en sangre puede disminuir.   Ictericia (coloracin amarillenta de la piel y los ojos): esto es bastante frecuente en los  bebs. La causa es la acumulacin de una sustancia qumica denominada bilirrubina. No siempre es un trastorno grave, pero se observa con frecuencia en los bebs cuyas madres sufren diabetes gestacional.  RIESGOS PARA LA MADRE Las mujeres que han sufrido diabetes gestacional pueden tener ms riesgos para algunos problemas como:  Preeclampsia o toxemia, incluyendo problemas con hipertensin arterial. La presin arterial y los niveles de protenas en la orina deben controlarse con frecuencia.   Infecciones   Parto por cesrea.   Aparicin de diabetes tipo 2 en una etapa posterior de la vida. Alrededor del 30% al 50% sufrir diabetes posteriormente, especialmente las que son obesas.  DIAGNSTICO Las hormonas que causan resistencia a la insulina tienen su mayor nivel alrededor de las 24 a 28 semanas del embarazo. Si se experimentan sntomas, stos son similares a los sntomas que normalmente aparecen durante el embarazo.  La diabetes mellitus gestacional generalmente se diagnostica por medio de un mtodo en dos partes: 1. Despus de la 24 a 28 semanas de embarazo, la mujer debe beber una solucin que contiene glucosa y realizar un anlisis de sangre. Si el nivel de glucosa es elevado, la realizarn un segundo anlisis.  2. La prueba oral de tolerancia a la glucosa, que dura aproximadamente tres horas. Despus de realizar ayuno durante la noche, se controla nivel de glucosa en sangre. La mujer bebe una solucin que contiene glucosa y le realizan anlisis de glucosa en sangre cada hora.  Si la mujer tiene factores de riesgos para la diabetes mellitus gestacional, el mdico podr indicar el anlisis antes de las 24 semanas de embarazo. TRATAMIENTO El tratamiento est dirigido a mantener la glucosa en   sangre de la madre en un nivel normal y puede incluir:  La planificacin de los alimentos.   Recibir insulina u otro medicamento para controlar el nivel de glucosa en sangre.   La prctica de ejercicios.    Llevar un registro diario de los alimentos que consume.   Control y registro de los niveles de glucosa en sangre.   Control de los niveles de cetona en la orina, aunque esto ya no se considera necesario en la mayora de los embarazos.  INSTRUCCIONES PARA EL CUIDADO DOMICILIARIO Mientras est embarazada:  Siga los consejos de su mdico relacionados con los controles prenatales, la planificacin de la comida, la actividad fsica, los medicamentos, vitaminas, los anlisis de sangre y otras pruebas y las actividades fsicas.   Lleve un registro de las comidas, las pruebas de glucosa en sangre y la cantidad de insulina que recibe (si corresponde). Muestre todo al profesional en cada consulta mdica prenatal.   Si sufre diabetes mellitus gestacional, podr tener problemas de hipoglucemia (nivel bajo de glucosa en sangre). Podr sospechar este problema si se siente repentinamente mareada, tiene temblores y/o se siente dbil. Si cree que esto le est ocurriendo, y tiene un medidor de glucosa, mida su nivel de glucosa en sangre. Siga los consejos de su mdico sobre el modo y el momento de tratar su nivel de glucosa en sangre. Generalmente se sigue la regla 15:15 Consuma 15 g de hidratos de carbono, espere 15 minutos y vuelva controlar el nivel de glucosa en sangre.. Ejemplos de 15 g de hidratos de carbono son:   1 taza de leche descremada.    taza de jugo.   3-4 tabletas de glucosa.   5-6 caramelos duros.   1 caja pequea de pasas de uva.    taza de gaseosa comn.   Mantenga una buena higiene para evitar infecciones.   No fume.  SOLICITE ATENCIN MDICA SI:  Observa prdida vaginal con o sin picazn.   Se siente ms dbil o cansada que lo habitual.   Transpira mucho.   Tiene un aumento de peso repentino, 2,5 kg o ms en una semana.   Pierde peso, 1.5 kg o ms en una semana.   Su nivel de glucosa en sangre es elevado, necesita instrucciones.  SOLICITE ATENCIN MDICA DE  INMEDIATO SI:  Sufre una cefalea intensa.   Se marea o pierde el conocimiento   Presenta nuseas o vmitos.   Se siente desorientada confundida.   Sufre convulsiones.   Tiene problemas de visin.   Siente dolor en el estmago.   Presenta una hemorragia vaginal abundante.   Tiene contracciones uterinas.   Tiene una prdida importante de lquido por la vagina  DESPUS QUE NACE EL BEB:  Concurra a todos los controles de seguimiento y realice los anlisis de sangre segn las indicaciones de su mdico.   Mantenga un estilo de vida saludable para evitar la diabetes en el futuro. Aqu se incluye:   Siga el plan de alimentacin saludable.   Controle su peso.   Practique actividad fsica y descanse lo necesario.   No fume.   Amamante a su beb mientras pueda. Esto disminuir la probabilidad de que usted y su beb sufran diabetes posteriormente.  Para ms informacin acerca de la diabetes, visite la pgina web de la American Diabetes Association: www.americandiabetesassociation.org. Para ms informacin acerca de la diabetes gestacional cite la pgina web del American Congress of Obstetricians and Gynecologists en: www.acog.org. Document Released: 08/21/2005 Document Revised: 10/31/2011 [employer] Patient Information 2012   [employer]. 

## 2012-07-06 NOTE — Progress Notes (Signed)
P= 87 C/o pressure in vaginal area

## 2012-07-09 ENCOUNTER — Ambulatory Visit (INDEPENDENT_AMBULATORY_CARE_PROVIDER_SITE_OTHER): Payer: Self-pay | Admitting: *Deleted

## 2012-07-09 VITALS — BP 126/58 | Temp 98.6°F | Wt 273.6 lb

## 2012-07-09 DIAGNOSIS — O99810 Abnormal glucose complicating pregnancy: Secondary | ICD-10-CM

## 2012-07-09 NOTE — Progress Notes (Addendum)
P=75, Used Interpreter [employer].   Unable to view tracing in [employer] at this time. LEGGETT,KELLY H. 7:57 AM

## 2012-07-13 ENCOUNTER — Ambulatory Visit (INDEPENDENT_AMBULATORY_CARE_PROVIDER_SITE_OTHER): Payer: Self-pay | Admitting: Family Medicine

## 2012-07-13 ENCOUNTER — Ambulatory Visit (HOSPITAL_COMMUNITY): Payer: Self-pay

## 2012-07-13 ENCOUNTER — Ambulatory Visit (HOSPITAL_COMMUNITY)
Admission: RE | Admit: 2012-07-13 | Discharge: 2012-07-13 | Disposition: A | Discharge status: Home / Self Care | Payer: Self-pay | Source: Ambulatory Visit | Attending: Obstetrics & Gynecology | Admitting: Obstetrics & Gynecology

## 2012-07-13 VITALS — BP 119/80 | Temp 98.3°F | Wt 276.0 lb

## 2012-07-13 DIAGNOSIS — O34219 Maternal care for unspecified type scar from previous cesarean delivery: Secondary | ICD-10-CM | POA: Insufficient documentation

## 2012-07-13 DIAGNOSIS — O99210 Obesity complicating pregnancy, unspecified trimester: Secondary | ICD-10-CM | POA: Insufficient documentation

## 2012-07-13 DIAGNOSIS — O10019 Pre-existing essential hypertension complicating pregnancy, unspecified trimester: Secondary | ICD-10-CM | POA: Insufficient documentation

## 2012-07-13 DIAGNOSIS — E119 Type 2 diabetes mellitus without complications: Secondary | ICD-10-CM

## 2012-07-13 DIAGNOSIS — O99810 Abnormal glucose complicating pregnancy: Secondary | ICD-10-CM | POA: Insufficient documentation

## 2012-07-13 DIAGNOSIS — E669 Obesity, unspecified: Secondary | ICD-10-CM | POA: Insufficient documentation

## 2012-07-13 LAB — POCT URINALYSIS DIP (DEVICE)
Glucose, UA: NEGATIVE mg/dL
Nitrite: NEGATIVE

## 2012-07-13 MED ORDER — INSULIN NPH (HUMAN) (ISOPHANE) 100 UNIT/ML ~~LOC~~ SUSP: SUBCUTANEOUS | Status: DC

## 2012-07-13 MED ORDER — "INSULIN SYRINGE-NEEDLE U-100 30G X 5/16"" 1 ML MISC": 1.0000 [IU] | Freq: Four times a day (QID) | Status: DC | PRN

## 2012-07-13 MED ORDER — INSULIN ASPART 100 UNIT/ML ~~LOC~~ SOLN: 25.0000 [IU] | Freq: Three times a day (TID) | SUBCUTANEOUS | Status: DC

## 2012-07-13 NOTE — Patient Instructions (Signed)
Diabetes mellitus gestacional (Gestational Diabetes Mellitus) La diabetes mellitus gestacional se produce slo durante el embarazo. Aparece cuando el organismo no puede controlar adecuadamente la glucosa (azcar) que aumenta en la sangre despus de comer. Durante el embarazo, se produce una resistencia a la insulina (sensibilidad reducida a la insulina) debido a la liberacin de hormonas por parte de la placenta. Generalmente, el pncreas de una mujer embarazada produce la cantidad suficiente de insulina para vencer esa resistencia. Sin embargo, en la diabetes gestacional, hay insulina pero no cumple su funcin adecuadamente. Si la resistencia es lo suficientemente grave como para que el pncreas no produzca la cantidad de insulina suficiente, la glucosa extra se acumula en la sangre.  QUINES TIENEN RIESGO DE DESARROLLAR DIABETES GESTACIONAL?  Las mujeres con historia de diabetes en la familia.   Las mujeres de ms de 25 aos.   Las que presentan sobrepeso.   Las mujeres que pertenecen a ciertos grupos tnicos (latinas, afroamericanas, norteamericanas nativas, asiticas y las originarias de las islas del [place].  QUE PUEDE OCURRIRLE AL BEB? Si el nivel de glucosa en sangre de la madre es demasiado elevado mientras este embarazada, el nivel extra de azcar pasar por el cordn umbilical hacia el beb. Algunos de los problemas del beb pueden ser:  Beb demasiado grande: si el nio recibe demasiada azcar, puede aumentar mucho de peso. Esto puede hacer que sea demasiado grande para nacer por parto normal (vaginal) por lo que ser necesario realizar una cesrea.   Bajo nivel de glucosa (hipoglucemia): el beb produce insulina extra en respuesta a la excesiva cantidad de azcar que obtiene de la madre. Cuando el beb nace y ya no necesita insulina extra, su nivel de azcar en sangre puede disminuir.   Ictericia (coloracin amarillenta de la piel y los ojos): esto es bastante frecuente en los  bebs. La causa es la acumulacin de una sustancia qumica denominada bilirrubina. No siempre es un trastorno grave, pero se observa con frecuencia en los bebs cuyas madres sufren diabetes gestacional.  RIESGOS PARA LA MADRE Las mujeres que han sufrido diabetes gestacional pueden tener ms riesgos para algunos problemas como:  Preeclampsia o toxemia, incluyendo problemas con hipertensin arterial. La presin arterial y los niveles de protenas en la orina deben controlarse con frecuencia.   Infecciones   Parto por cesrea.   Aparicin de diabetes tipo 2 en una etapa posterior de la vida. Alrededor del 30% al 50% sufrir diabetes posteriormente, especialmente las que son obesas.  DIAGNSTICO Las hormonas que causan resistencia a la insulina tienen su mayor nivel alrededor de las 24 a 28 semanas del embarazo. Si se experimentan sntomas, stos son similares a los sntomas que normalmente aparecen durante el embarazo.  La diabetes mellitus gestacional generalmente se diagnostica por medio de un mtodo en dos partes: 1. Despus de la 24 a 28 semanas de embarazo, la mujer debe beber una solucin que contiene glucosa y realizar un anlisis de sangre. Si el nivel de glucosa es elevado, la realizarn un segundo anlisis.  2. La prueba oral de tolerancia a la glucosa, que dura aproximadamente tres horas. Despus de realizar ayuno durante la noche, se controla nivel de glucosa en sangre. La mujer bebe una solucin que contiene glucosa y le realizan anlisis de glucosa en sangre cada hora.  Si la mujer tiene factores de riesgos para la diabetes mellitus gestacional, el mdico podr indicar el anlisis antes de las 24 semanas de embarazo. TRATAMIENTO El tratamiento est dirigido a mantener la glucosa en   sangre de la madre en un nivel normal y puede incluir:  La planificacin de los alimentos.   Recibir insulina u otro medicamento para controlar el nivel de glucosa en sangre.   La prctica de ejercicios.    Llevar un registro diario de los alimentos que consume.   Control y registro de los niveles de glucosa en sangre.   Control de los niveles de cetona en la orina, aunque esto ya no se considera necesario en la mayora de los embarazos.  INSTRUCCIONES PARA EL CUIDADO DOMICILIARIO Mientras est embarazada:  Siga los consejos de su mdico relacionados con los controles prenatales, la planificacin de la comida, la actividad fsica, los medicamentos, vitaminas, los anlisis de sangre y otras pruebas y las actividades fsicas.   Lleve un registro de las comidas, las pruebas de glucosa en sangre y la cantidad de insulina que recibe (si corresponde). Muestre todo al profesional en cada consulta mdica prenatal.   Si sufre diabetes mellitus gestacional, podr tener problemas de hipoglucemia (nivel bajo de glucosa en sangre). Podr sospechar este problema si se siente repentinamente mareada, tiene temblores y/o se siente dbil. Si cree que esto le est ocurriendo, y tiene un medidor de glucosa, mida su nivel de glucosa en sangre. Siga los consejos de su mdico sobre el modo y el momento de tratar su nivel de glucosa en sangre. Generalmente se sigue la regla 15:15 Consuma 15 g de hidratos de carbono, espere 15 minutos y vuelva controlar el nivel de glucosa en sangre.. Ejemplos de 15 g de hidratos de carbono son:   1 taza de leche descremada.    taza de jugo.   3-4 tabletas de glucosa.   5-6 caramelos duros.   1 caja pequea de pasas de uva.    taza de gaseosa comn.   Mantenga una buena higiene para evitar infecciones.   No fume.  SOLICITE ATENCIN MDICA SI:  Observa prdida vaginal con o sin picazn.   Se siente ms dbil o cansada que lo habitual.   Transpira mucho.   Tiene un aumento de peso repentino, 2,5 kg o ms en una semana.   Pierde peso, 1.5 kg o ms en una semana.   Su nivel de glucosa en sangre es elevado, necesita instrucciones.  SOLICITE ATENCIN MDICA DE  INMEDIATO SI:  Sufre una cefalea intensa.   Se marea o pierde el conocimiento   Presenta nuseas o vmitos.   Se siente desorientada confundida.   Sufre convulsiones.   Tiene problemas de visin.   Siente dolor en el estmago.   Presenta una hemorragia vaginal abundante.   Tiene contracciones uterinas.   Tiene una prdida importante de lquido por la vagina  DESPUS QUE NACE EL BEB:  Concurra a todos los controles de seguimiento y realice los anlisis de sangre segn las indicaciones de su mdico.   Mantenga un estilo de vida saludable para evitar la diabetes en el futuro. Aqu se incluye:   Siga el plan de alimentacin saludable.   Controle su peso.   Practique actividad fsica y descanse lo necesario.   No fume.   Amamante a su beb mientras pueda. Esto disminuir la probabilidad de que usted y su beb sufran diabetes posteriormente.  Para ms informacin acerca de la diabetes, visite la pgina web de la American Diabetes Association: www.americandiabetesassociation.org. Para ms informacin acerca de la diabetes gestacional cite la pgina web del American Congress of Ob[employer]ians and [employer]necologists en: www.acog.org. Document Released: 08/21/2005 Document Revised: 10/31/2011 Exit[employer] Patient Information 2012   [employer]. Eleccin del mtodo anticonceptivo  (Contraception Choices) La anticoncepcin (control de la natalidad) es el uso de cualquier mtodo o dispositivo para evitar el embarazo. A continuacin se indican algunos de esos mtodos.  MTODOS HORMONALES   Implante anticonceptivo. Es un tubo plstico delgado que contiene la hormona progesterona. No contiene estrgenos. El mdico inserta el tubo en la parte interna del brazo. El tubo puede permanecer en el lugar durante 3 aos. Despus de los 3 aos debe retirarse. El implante impide que los ovarios liberen vulos (ovulacin), espesa el moco cervical, lo que evita que los espermatozoides ingresen al tero y  hace ms delgada la membrana que cubre el interior del tero.   Inyecciones de progesterona sola. Estas inyecciones se administran cada 3 meses para evitar el embarazo. La progesterona sinttica impide que los ovarios liberen vulos. Tambin hace que el moco cervical se espese y modifica el recubrimiento interno del tero. Esto hace ms difcil que los espermatozoides sobrevivan en el tero.   Pldoras anticonceptivas. Las pldoras anticonceptivas contienen estrgenos y progesterona. Actan impidiendo que el vulo se forme en el ovario(ovulacin). Las pldoras anticonceptivas son recetadas por el mdico.Tambin se utilizan para tratar los perodos menstruales abundantes.   Minipldora. Este tipo de pldora anticonceptiva contiene slo hormona progesterona. Deben tomarse todos los das del mes y debe recetarlas el mdico.   Parches anticonceptivos. El parche contiene hormonas similares a las que contienen las pldoras anticonceptivas. Deben cambiarse una vez por semana y se utilizan bajo prescripcin mdica.   Anillo vaginal. Anillo vaginal contiene hormonas similares a las que contienen las pldoras anticonceptivas. Se deja colocado durante tres semanas, se lo retira durante 1 semana y luego se coloca uno nuevo. La paciente debe sentirse cmoda para insertar y retirar el anillo de la vagina.Es necesaria la receta del mdico.   Anticonceptivos de emergencia. Los anticonceptivos de emergencia son mtodos para evitar un embarazo despus de una relacin sexual sin proteccin. Esta pldora puede tomarse inmediatamente despus de tener relaciones sexuales o hasta 5 das de haber tenido sexo sin proteccin. Es ms efectiva si se toma poco tiempo despus. Los anticonceptivos de emergencia estn disponibles sin prescripcin mdica. Consltelo con su farmacutico. No use los anticonceptivos de emergencia como nico mtodo anticonceptivo.  MTODOS DE BARRERA   Condn masculino. Es una vaina delgada (ltex o  goma) que se usa en el pene durante el acto sexual. Puede usarse con espermicida para aumentar la efectividad.   Condn femenino. Es una vaina blanda y floja que se adapta suavemente a la vagina antes de las relaciones sexuales.   Diafragma. Es una barrera de ltex redonda y suave que debe ser ajustada por un profesional. Se inserta en la vagina, junto con un gel espermicida. Debe insertarse antes de tener relaciones sexuales. Debe dejar el diafragma colocado en la vagina durante 6 a 8 horas despus de la relacin sexual.   Capuchn cervical. Es una taza de ltex o plstico, redonda y suave que cubre el cuello del tero y debe ser ajustada por un mdico. Puede dejarlo colocado en la vagina hasta 48 horas despus de las relaciones sexuales.   Esponja. Es una pieza blanda y circular de espuma de poliuretano. Contiene un espermicida. Se inserta en la vagina despus de mojarla y antes de las relaciones sexuales.   Espermicidas. Los espermicidas son qumicos que matan o bloquean el esperma y no lo dejan ingresar al cuello del tero y al tero. Vienen en forma de cremas, geles, supositorios, espuma o   comprimidos. No es necesario tener receta mdica. Se insertan en la vagina con un aplicador antes de tener relaciones sexuales. El proceso debe repetirse cada vez que tiene relaciones sexuales.  ANTICONCEPTIVOS INTRAUTERINOS   Dispositivo intrauterino (DIU). Es un dispositivo en forma de T que se coloca en el tero durante el perodo menstrual, para evitar el embarazo. Hay dos tipos:   DIU de cobre. Este tipo de DIU est recubierto con un alambre de cobre y se inserta dentro del tero. El cobre hace que el tero y las trompas de Falopio produzcan un liquido que destruye los espermatozoides. Puede permanecer colocado durante 10 aos.   DIU hormonal. Este tipo de DIU contiene la hormona progestina (progesterona sinttica). La hormona espesa el moco cervical y evita que los espermatozoides ingresen al tero y  tambin afina la membrana que cubre el tero para evitar la implantacin del vulo fertilizado. La hormona debilita o destruye los espermatozoides que ingresan al tero. Puede permanecer colocado durante 5 aos.  MTODOS ANTICONCEPTIVOS PERMANENTES   Ligadura de trompas en la mujer. La ligadura de trompas en la mujer se realiza sellando, atando u obstruyendo quirrgicamente las trompas de Falopio lo que impide que el vulo descienda hacia el tero.   Esterilizacin masculina. Se realiza atando los conductos por los que pasan los espermatozoides (vasectoma).Esto impide que el esperma ingrese a la vagina durante el acto sexual. Luego del procedimiento, el hombre puede eyacular lquido (semen).  MTODOS DE PLANIFICACIN NATURAL   Planificacin familiar natural.  Consiste en no tener relaciones sexuales o usar un mtodo de barrera (condn, diafragma, capuchn cervical) en los das que la mujer podra quedar embarazada.   Mtodo calendario.  Consiste en el seguimiento de la duracin de cada ciclo menstrual y la identificacin de los perodos frtiles.   Mtodo de la ovulacin.  Consiste en evitar las relaciones sexuales durante la ovulacin.   Mtodo sintotrmico. Consiste en evitar las relaciones sexuales en la poca en la que se est ovulando, utilizando un termmetro y tendiendo en cuenta los sntomas de la ovulacin.   Mtodo post-ovulacin. Consiste en planificar las relaciones sexuales para despus de haber ovulado.  Independientemente del tipo o mtodo anticonceptivo que usted elija, es importante que use condones para protegerse contra las enfermedades de transmisin sexual (ETS). Hable con su mdico con respecto a qu mtodo anticonceptivo es el ms apropiado para usted.  Document Released: 11/11/2005 Document Revised: 10/31/2011 [employer] Patient Information 2012 [employer]. Amamantar al beb (Breastfeeding) LOS BENEFICIOS DE AMAMANTAR Para el beb  La primera leche (calostro )  ayuda al mejor funcionamiento del sistema digestivo del beb.   La leche tiene anticuerpos que provienen de la madre y que ayudan a prevenir las infecciones en el beb.   Hay una menor incidencia de asma, enfermedades alrgicas y SMSI (sndrome de muerte sbita nfantil).   Los nutrientes que contiene la leche materna son mejores que las frmulas para el bibern y favorecen el desarrollo cerebral.   Los bebs amamantados sufren menos gases, clicos y constipacin.  Para la mam  La lactancia materna favorece el desarrollo de un vnculo muy especial entre la madre y el beb.   Es ms conveniente, siempre disponible a la temperatura adecuada y ms econmica que la leche maternizada.   Consume caloras en la madre y la ayuda a perder el peso ganado durante el embarazo.   Favorece la contraccin del tero a su tamao normal, de manera ms rpida y disminuye las hemorragias luego del parto.     Las madres que amamantan tienen menor riesgo de desarrollar cncer de mama.  AMAMNTELO CON FRECUENCIA  Un beb sano, nacido a trmino, puede amamantarse con tanta frecuencia como cada hora, o espaciar las comidas cada tres horas.   Esta frecuencia variar de un beb a otro. Observe al beb cuando manifieste signos de hambre, antes que regirse por el reloj.   Amamntelo tan seguido como el beb lo solicite, o cuando usted sienta la necesidad de aliviar sus mamas.   Despierte al beb si han pasado 3  4 horas desde la ltima comida.   El amamantamiento frecuente la ayudar a producir ms leche y a prevenir problemas de dolor en los pezones e hinchazn de las mamas.  LA POSICIN DEL BEB PARA AMAMANTARLO  Ya sea que se encuentre acostada o sentada, asegrese que el abdomen del beb enfrente el suyo.   Sostenga la mama con el pulgar por arriba y el resto de los dedos por debajo. Asegrese que sus dedos se encuentren lejos del pezn y de la boca del beb.   Toque suavemente los labios del beb y la  mejilla ms cercana a la mama con el dedo o el pezn.   Cuando la boca del beb se abra lo suficiente, introduzca el pezn y la zona oscura que lo rodea tanto como le sea posible dentro de la boca.   Coloque a beb cerca suyo de modo que su nariz y mejillas toquen las mamas al mamar.  LAS COMIDAS  La duracin de cada comida vara de un beb a otro y de una comida a otra.   El beb debe succionar alrededor de dos o tres minutos para que le llegue leche. Esto se denomina "bajada". Por este motivo, permita que el nio se alimente en cada mama todo lo que desee. Terminar de mamar cuando haya recibido la cantidad adecuada de nutrientes.   Para detener la succin coloque su dedo en la comisura de la boca del nio y deslcelo entre sus encas antes de quitarle la mama de la boca. Esto la ayudar a evitar el dolor en los pezones.  REDUCIR LA CONGESTIN DE LAS MAMAS  Durante la primera semana despus del parto, usted puede experimentar congestin en las mamas. Cuando las mamas estn congestionadas, se sienten calientes, llenas y molestas al tacto. Puede reducir la congestin si:   Lo amamanta frecuentemente, cada 2-3 horas. Las mams que amamantan pronto y con frecuencia tienen menos problemas de congestin.   Coloque bolsas fras livianas entre cada mamada. Esto ayuda a reducir la hinchazn. Envuelva las bolsas de hielo en una toalla liviana para proteger su piel.   Aplique compresas hmedas calientes sobre la mama durante 5 a 10 minutos antes de amamantar al nio. Esto aumenta la circulacin y ayuda a que la leche fluya.   Masajee suavemente la mama antes y durante la alimentacin.   Asegrese que el nio vaca al menos una mama antes de cambiar de lado.   Use un sacaleche para vaciar la mama si el beb se duerme o no se alimenta bien. Tambin podr quitarse la leche con esta bomba si tiene que volver al trabajo o siente que las mamas estn congestionadas.   Evite los biberones, chupetes o  complementar la alimentacin con agua o jugos en lugar de la leche materna.   Verifique que el beb se encuentra en la posicin correcta mientras lo alimenta.   Evite el cansancio, el estrs y la anemia   Use un soutien que   sostenga bien sus mamas y evite los que tienen aro.   Consuma una dieta balanceada y beba lquidos en cantidad.  Si sigue estas indicaciones, la congestin debe mejorar en 24 a 48 horas. Si an tiene dificultades, consulte a su asesor en lactancia. TENDR SUFICIENTE LECHE MI BEB? Algunas veces las madres se preocupan acerca de si sus bebs tendrn la leche suficiente. Puede asegurarse que el beb tiene la leche suficiente si:  El beb succiona y escucha que traga activamente.   El nio se alimenta al menos 8 a 12 veces en 24 horas. Alimntelo hasta que se desprenda por sus propios medios o se quede dormido en la primera mama (al menos durante 10 a 20 minutos), luego ofrzcale el otro lado.   El beb moja 5 a 6 paales descartables (6 a 8 paales de tela) en 24 horas cuando tiene 5  6 das de vida.   Tiene al menos 2-3 deposiciones todos los das en los primeros meses. La leche materna es todo el alimento que el beb necesita. No es necesario que el nio ingiera agua o preparados de bibern. De hecho, para ayudar a que sus mamas produzcan ms leche, lo mejor es no darle al beb suplementos durante las primeras semanas.   La materia fecal debe ser blanda y amarillenta.   El beb debe aumentar 112 a 196 g por semana.  CUDESE Cuide sus mamas del siguiente modo:  Bese o dchese diariamente.   No lave sus pezones con jabn.   Comience a amamantar del lado izquierdo en una comida y del lado derecho en la siguiente.   Notar que aumenta el suministro de leche a los 2 a 5 das despus del parto. Puede sentir algunas molestias por la congestin, lo que hace que sus mamas estn duras y sensibles. La congestin disminuye en 24 a 48 horas. Mientras tanto, aplique toallas  hmedas calientes durante 5 a 10 minutos antes de amamantar. Un masaje suave y la extraccin de un poco de leche antes de amamantar ablandarn las mamas y har ms fcil que el beb se agarre. Use un buen sostn y seque al aire los pezones durante 10 a 15 minutos luego de cada alimentacin.   Solo utilice apsitos de algodn.   Utilice lanolina pura sobre los pezones luego de amamantar. No necesita lavarlos luego de alimentar al nio.  Cudese del siguiente modo:   Consuma alimentos bien balanceados y refrigerios nutritivos.   Beba leche, jugos de fruta y agua para satisfacer la sed (alrededor de 8 vasos por da).   Descanse lo suficiente.   Aumente la ingesta de calcio en la dieta (1200mg/da).   Evite los alimentos que usted nota que puedan afectar al beb.  SOLICITE ATENCIN MDICA SI:  Tiene preguntas que formular o dificultades con la alimentacin a pecho.   Necesita ayuda.   Observa una zona dura, roja y que le duele en la zona de la mama, y se acompaa de fiebre de 100.5 F (38.1 C) o ms.   El beb est muy somnoliento como para alimentarse bien o tiene problemas para dormir.   El beb moja menos de 6 paales por da, a partir de los 5 das de vida.   La piel del beb o la parte blanca de sus ojos est ms amarilla de lo que estaba en el hospital.   Se siente deprimida.  Document Released: 11/11/2005 Document Revised: 10/31/2011 [employer] Patient Information 2012 [employer]. 

## 2012-07-13 NOTE — Progress Notes (Signed)
Pulse 82 C/o left knee pain X66month. C/o vaginal pressure.

## 2012-07-13 NOTE — Progress Notes (Signed)
[place] growth today @ 1445.  Cardio held in place for NST- extremely difficult to trace FHR

## 2012-07-13 NOTE — Progress Notes (Signed)
FBS 92-110 2 hr pp 73-289  Insulin adjusted. NST reviewed and reactive.

## 2012-07-16 ENCOUNTER — Other Ambulatory Visit: Payer: Self-pay

## 2012-07-20 ENCOUNTER — Other Ambulatory Visit: Payer: Self-pay

## 2012-07-21 ENCOUNTER — Ambulatory Visit (INDEPENDENT_AMBULATORY_CARE_PROVIDER_SITE_OTHER): Payer: Self-pay | Admitting: *Deleted

## 2012-07-21 ENCOUNTER — Ambulatory Visit (HOSPITAL_COMMUNITY)
Admission: RE | Admit: 2012-07-21 | Discharge: 2012-07-21 | Disposition: A | Discharge status: Home / Self Care | Payer: Self-pay | Source: Ambulatory Visit | Attending: Obstetrics & Gynecology | Admitting: Obstetrics & Gynecology

## 2012-07-21 VITALS — BP 120/64 | Wt 276.4 lb

## 2012-07-21 DIAGNOSIS — E669 Obesity, unspecified: Secondary | ICD-10-CM | POA: Insufficient documentation

## 2012-07-21 DIAGNOSIS — O99810 Abnormal glucose complicating pregnancy: Secondary | ICD-10-CM

## 2012-07-21 DIAGNOSIS — O0990 Supervision of high risk pregnancy, unspecified, unspecified trimester: Secondary | ICD-10-CM

## 2012-07-21 DIAGNOSIS — O34219 Maternal care for unspecified type scar from previous cesarean delivery: Secondary | ICD-10-CM | POA: Insufficient documentation

## 2012-07-21 DIAGNOSIS — O10019 Pre-existing essential hypertension complicating pregnancy, unspecified trimester: Secondary | ICD-10-CM | POA: Insufficient documentation

## 2012-07-21 DIAGNOSIS — O99210 Obesity complicating pregnancy, unspecified trimester: Secondary | ICD-10-CM

## 2012-07-21 NOTE — Progress Notes (Signed)
NST reviewed and reactive.  Kirrah Mustin L. Harraway-Smith, M.D., FACOG    

## 2012-07-21 NOTE — Progress Notes (Signed)
P = 72  [place] growth today.  Pt stated she could not keep appt yesterday because she was moving. I reviewed her CBG log book- several PP elevations 130's-140's.  Pt encouraged to adhere to GDM diet and will review log again on 07/24/12.

## 2012-07-21 NOTE — Progress Notes (Signed)
NST reviewed and reactive.  Yassine Brunsman L. Harraway-Smith, M.D., FACOG    

## 2012-07-24 ENCOUNTER — Ambulatory Visit (INDEPENDENT_AMBULATORY_CARE_PROVIDER_SITE_OTHER): Payer: Self-pay | Admitting: *Deleted

## 2012-07-24 VITALS — Wt 278.9 lb

## 2012-07-24 DIAGNOSIS — O99810 Abnormal glucose complicating pregnancy: Secondary | ICD-10-CM

## 2012-07-24 NOTE — Progress Notes (Signed)
Pt reports she is checking her CBG 7-8 times daily because she is curious as to the result. She denies sx of low or high blood sugar when she is checking so frequently.  I told pt that it is not necessary to check so often unless she is symptomatic of an abnormal blood sugar. I asked her to only check at the prescribed times- fasting and 2 hrs PP.  Pt voiced understanding.  Interpreter: Dorita present for entire visit.

## 2012-07-26 NOTE — Progress Notes (Signed)
NST reviewed and reactive.  

## 2012-07-28 ENCOUNTER — Other Ambulatory Visit: Payer: Self-pay

## 2012-07-31 ENCOUNTER — Ambulatory Visit (INDEPENDENT_AMBULATORY_CARE_PROVIDER_SITE_OTHER): Payer: Self-pay | Admitting: *Deleted

## 2012-07-31 VITALS — BP 123/75 | Wt 278.2 lb

## 2012-07-31 DIAGNOSIS — O99810 Abnormal glucose complicating pregnancy: Secondary | ICD-10-CM

## 2012-07-31 NOTE — Progress Notes (Signed)
P = 144,  Re-check = 76.  Pt reports decr. FM today- felt good FM during NST.

## 2012-08-03 ENCOUNTER — Ambulatory Visit (INDEPENDENT_AMBULATORY_CARE_PROVIDER_SITE_OTHER): Payer: Self-pay | Admitting: Obstetrics & Gynecology

## 2012-08-03 ENCOUNTER — Encounter: Payer: Self-pay | Attending: Advanced Practice Midwife | Admitting: Dietician

## 2012-08-03 ENCOUNTER — Encounter: Payer: Self-pay | Admitting: Obstetrics & Gynecology

## 2012-08-03 VITALS — BP 130/80 | Temp 99.4°F | Wt 277.3 lb

## 2012-08-03 DIAGNOSIS — Z713 Dietary counseling and surveillance: Secondary | ICD-10-CM | POA: Insufficient documentation

## 2012-08-03 DIAGNOSIS — E669 Obesity, unspecified: Secondary | ICD-10-CM

## 2012-08-03 DIAGNOSIS — O34219 Maternal care for unspecified type scar from previous cesarean delivery: Secondary | ICD-10-CM

## 2012-08-03 DIAGNOSIS — O99810 Abnormal glucose complicating pregnancy: Secondary | ICD-10-CM | POA: Insufficient documentation

## 2012-08-03 DIAGNOSIS — O99210 Obesity complicating pregnancy, unspecified trimester: Secondary | ICD-10-CM

## 2012-08-03 LAB — POCT URINALYSIS DIP (DEVICE)
Protein, ur: NEGATIVE mg/dL
Urobilinogen, UA: 1 mg/dL (ref 0.0–1.0)
pH: 6 (ref 5.0–8.0)

## 2012-08-03 MED ORDER — INSULIN ASPART 100 UNIT/ML ~~LOC~~ SOLN: 27.0000 [IU] | Freq: Three times a day (TID) | SUBCUTANEOUS | Status: DC

## 2012-08-03 MED ORDER — INSULIN NPH (HUMAN) (ISOPHANE) 100 UNIT/ML ~~LOC~~ SUSP: SUBCUTANEOUS | Status: DC

## 2012-08-03 NOTE — Progress Notes (Signed)
78% growth on 07/21/12.   Fasting 102,105,113,100,103,108,86,86,95,89  2 hr pp breakfast 108,151,97,145,120,135,128,129,105  2 hr pp lunch 102,119,100,145,92,115,148,117,125  2 hr pp dinner [phone].  Pt encouraged to walk in evenings.  Pt does eat a snack.   NST reactive today.  Still undecided about birth control.

## 2012-08-03 NOTE — Patient Instructions (Signed)
Amamantar al beb (Breastfeeding) LOS BENEFICIOS DE AMAMANTAR Para el beb  La primera leche (calostro ) ayuda al mejor funcionamiento del sistema digestivo del beb.   La leche tiene anticuerpos que provienen de la madre y que ayudan a prevenir las infecciones en el beb.   Hay una menor incidencia de asma, enfermedades alrgicas y SMSI (sndrome de muerte sbita nfantil).   Los nutrientes que contiene la leche materna son mejores que las frmulas para el bibern y favorecen el desarrollo cerebral.   Los bebs amamantados sufren menos gases, clicos y constipacin.  Para la mam  La lactancia materna favorece el desarrollo de un vnculo muy especial entre la madre y el beb.   Es ms conveniente, siempre disponible a la temperatura adecuada y ms econmica que la leche maternizada.   Consume caloras en la madre y la ayuda a perder el peso ganado durante el embarazo.   Favorece la contraccin del tero a su tamao normal, de manera ms rpida y disminuye las hemorragias luego del parto.   Las madres que amamantan tienen menor riesgo de desarrollar cncer de mama.  AMAMNTELO CON FRECUENCIA  Un beb sano, nacido a trmino, puede amamantarse con tanta frecuencia como cada hora, o espaciar las comidas cada tres horas.   Esta frecuencia variar de un beb a otro. Observe al beb cuando manifieste signos de hambre, antes que regirse por el reloj.   Amamntelo tan seguido como el beb lo solicite, o cuando usted sienta la necesidad de aliviar sus mamas.   Despierte al beb si han pasado 3  4 horas desde la ltima comida.   El amamantamiento frecuente la ayudar a producir ms leche y a prevenir problemas de dolor en los pezones e hinchazn de las mamas.  LA POSICIN DEL BEB PARA AMAMANTARLO  Ya sea que se encuentre acostada o sentada, asegrese que el abdomen del beb enfrente el suyo.   Sostenga la mama con el pulgar por arriba y el resto de los dedos por debajo. Asegrese que  sus dedos se encuentren lejos del pezn y de la boca del beb.   Toque suavemente los labios del beb y la mejilla ms cercana a la mama con el dedo o el pezn.   Cuando la boca del beb se abra lo suficiente, introduzca el pezn y la zona oscura que lo rodea tanto como le sea posible dentro de la boca.   Coloque a beb cerca suyo de modo que su nariz y mejillas toquen las mamas al mamar.  LAS COMIDAS  La duracin de cada comida vara de un beb a otro y de una comida a otra.   El beb debe succionar alrededor de dos o tres minutos para que le llegue leche. Esto se denomina "bajada". Por este motivo, permita que el nio se alimente en cada mama todo lo que desee. Terminar de mamar cuando haya recibido la cantidad adecuada de nutrientes.   Para detener la succin coloque su dedo en la comisura de la boca del nio y deslcelo entre sus encas antes de quitarle la mama de la boca. Esto la ayudar a evitar el dolor en los pezones.  REDUCIR LA CONGESTIN DE LAS MAMAS  Durante la primera semana despus del parto, usted puede experimentar congestin en las mamas. Cuando las mamas estn congestionadas, se sienten calientes, llenas y molestas al tacto. Puede reducir la congestin si:   Lo amamanta frecuentemente, cada 2-3 horas. Las mams que amamantan pronto y con frecuencia tienen menos problemas   de congestin.   Coloque bolsas fras livianas entre cada mamada. Esto ayuda a reducir la hinchazn. Envuelva las bolsas de hielo en una toalla liviana para proteger su piel.   Aplique compresas hmedas calientes sobre la mama durante 5 a 10 minutos antes de amamantar al nio. Esto aumenta la circulacin y ayuda a que la leche fluya.   Masajee suavemente la mama antes y durante la alimentacin.   Asegrese que el nio vaca al menos una mama antes de cambiar de lado.   Use un sacaleche para vaciar la mama si el beb se duerme o no se alimenta bien. Tambin podr quitarse la leche con esta bomba si  tiene que volver al trabajo o siente que las mamas estn congestionadas.   Evite los biberones, chupetes o complementar la alimentacin con agua o jugos en lugar de la leche materna.   Verifique que el beb se encuentra en la posicin correcta mientras lo alimenta.   Evite el cansancio, el estrs y la anemia   Use un soutien que sostenga bien sus mamas y evite los que tienen aro.   Consuma una dieta balanceada y beba lquidos en cantidad.  Si sigue estas indicaciones, la congestin debe mejorar en 24 a 48 horas. Si an tiene dificultades, consulte a su asesor en lactancia. TENDR SUFICIENTE LECHE MI BEB? Algunas veces las madres se preocupan acerca de si sus bebs tendrn la leche suficiente. Puede asegurarse que el beb tiene la leche suficiente si:  El beb succiona y escucha que traga activamente.   El nio se alimenta al menos 8 a 12 veces en 24 horas. Alimntelo hasta que se desprenda por sus propios medios o se quede dormido en la primera mama (al menos durante 10 a 20 minutos), luego ofrzcale el otro lado.   El beb moja 5 a 6 paales descartables (6 a 8 paales de tela) en 24 horas cuando tiene 5  6 das de vida.   Tiene al menos 2-3 deposiciones todos los das en los primeros meses. La leche materna es todo el alimento que el beb necesita. No es necesario que el nio ingiera agua o preparados de bibern. De hecho, para ayudar a que sus mamas produzcan ms leche, lo mejor es no darle al beb suplementos durante las primeras semanas.   La materia fecal debe ser blanda y amarillenta.   El beb debe aumentar 112 a 196 g por semana.  CUDESE Cuide sus mamas del siguiente modo:  Bese o dchese diariamente.   No lave sus pezones con jabn.   Comience a amamantar del lado izquierdo en una comida y del lado derecho en la siguiente.   Notar que aumenta el suministro de leche a los 2 a 5 das despus del parto. Puede sentir algunas molestias por la congestin, lo que hace que  sus mamas estn duras y sensibles. La congestin disminuye en 24 a 48 horas. Mientras tanto, aplique toallas hmedas calientes durante 5 a 10 minutos antes de amamantar. Un masaje suave y la extraccin de un poco de leche antes de amamantar ablandarn las mamas y har ms fcil que el beb se agarre. Use un buen sostn y seque al aire los pezones durante 10 a 15 minutos luego de cada alimentacin.   Solo utilice apsitos de algodn.   Utilice lanolina pura sobre los pezones luego de amamantar. No necesita lavarlos luego de alimentar al nio.  Cudese del siguiente modo:   Consuma alimentos bien balanceados y refrigerios nutritivos.     Beba leche, jugos de fruta y agua para satisfacer la sed (alrededor de 8 vasos por da).   Descanse lo suficiente.   Aumente la ingesta de calcio en la dieta (1200mg/da).   Evite los alimentos que usted nota que puedan afectar al beb.  SOLICITE ATENCIN MDICA SI:  Tiene preguntas que formular o dificultades con la alimentacin a pecho.   Necesita ayuda.   Observa una zona dura, roja y que le duele en la zona de la mama, y se acompaa de fiebre de 100.5 F (38.1 C) o ms.   El beb est muy somnoliento como para alimentarse bien o tiene problemas para dormir.   El beb moja menos de 6 paales por da, a partir de los 5 das de vida.   La piel del beb o la parte blanca de sus ojos est ms amarilla de lo que estaba en el hospital.   Se siente deprimida.  Document Released: 11/11/2005 Document Revised: 10/31/2011 [employer] Patient Information 2012 [employer]. 

## 2012-08-03 NOTE — Progress Notes (Signed)
Pt is unsure if she wants Rpt C/s or VBAC.  She is attempting to find out about her uterine scar (transverse vs vertical incision) - she has contacts in [place] and will call again today.

## 2012-08-03 NOTE — Progress Notes (Signed)
Pulse: 88

## 2012-08-04 LAB — GC/CHLAMYDIA PROBE AMP, GENITAL: Chlamydia, DNA Probe: NEGATIVE

## 2012-08-07 ENCOUNTER — Ambulatory Visit (INDEPENDENT_AMBULATORY_CARE_PROVIDER_SITE_OTHER): Payer: Self-pay | Admitting: *Deleted

## 2012-08-07 ENCOUNTER — Ambulatory Visit (HOSPITAL_COMMUNITY)
Admission: RE | Admit: 2012-08-07 | Discharge: 2012-08-07 | Disposition: A | Discharge status: Home / Self Care | Payer: Self-pay | Source: Ambulatory Visit | Attending: Obstetrics & Gynecology | Admitting: Obstetrics & Gynecology

## 2012-08-07 VITALS — BP 116/70 | Wt 275.4 lb

## 2012-08-07 DIAGNOSIS — O99810 Abnormal glucose complicating pregnancy: Secondary | ICD-10-CM

## 2012-08-07 DIAGNOSIS — O99210 Obesity complicating pregnancy, unspecified trimester: Secondary | ICD-10-CM | POA: Insufficient documentation

## 2012-08-07 DIAGNOSIS — E669 Obesity, unspecified: Secondary | ICD-10-CM | POA: Insufficient documentation

## 2012-08-07 DIAGNOSIS — O34219 Maternal care for unspecified type scar from previous cesarean delivery: Secondary | ICD-10-CM | POA: Insufficient documentation

## 2012-08-07 DIAGNOSIS — O10019 Pre-existing essential hypertension complicating pregnancy, unspecified trimester: Secondary | ICD-10-CM | POA: Insufficient documentation

## 2012-08-07 NOTE — Progress Notes (Signed)
Pt states that she has contacted her doctor in [place] regarding her previous C/S. The information she received is as follows:  Her C/S was done due to failure to progress and fetal distress. The incision on her uterus is horizontal. In talking with this doctor, he recommended to her that she have another C/S because it has been 10 years since her last child, she had a difficult labor and she is overweight w/Diabetes. AFI today @ [place] dept.

## 2012-08-07 NOTE — Progress Notes (Signed)
NST reactive today.

## 2012-08-10 ENCOUNTER — Other Ambulatory Visit: Payer: Self-pay

## 2012-08-11 ENCOUNTER — Ambulatory Visit (INDEPENDENT_AMBULATORY_CARE_PROVIDER_SITE_OTHER): Payer: Self-pay | Admitting: *Deleted

## 2012-08-11 VITALS — BP 112/68 | Wt 277.7 lb

## 2012-08-11 DIAGNOSIS — O99810 Abnormal glucose complicating pregnancy: Secondary | ICD-10-CM

## 2012-08-11 NOTE — Progress Notes (Signed)
P = 68   Pt did not have ride for OB f/u appt yesterday.   Pt has scheduled C/S on 08/17/12.  CBG log book reviewed- several PP breakfast and lunch slightly out of range (125-130's). All PP dinner values elevated- 120's-140's. Dr. Debroah Loop notified of test results and CBG readings. No changes to insulin regimen @ this time.  Discussed dietary changes w/pt as she is eating more carbs @ some meals than what is recommended. Pt voiced understanding.  Interpreter- Lissa Hoard present during entire visit.

## 2012-08-11 NOTE — Progress Notes (Signed)
NST 08/11/12 reactive  

## 2012-08-12 ENCOUNTER — Encounter (HOSPITAL_COMMUNITY): Payer: Self-pay

## 2012-08-12 ENCOUNTER — Encounter (HOSPITAL_COMMUNITY)
Admission: RE | Admit: 2012-08-12 | Discharge: 2012-08-12 | Disposition: A | Discharge status: Home / Self Care | Payer: Medicaid Other | Source: Ambulatory Visit | Attending: Family Medicine | Admitting: Family Medicine

## 2012-08-12 VITALS — BP 130/77 | Ht 62.5 in | Wt 265.0 lb

## 2012-08-12 DIAGNOSIS — O99210 Obesity complicating pregnancy, unspecified trimester: Secondary | ICD-10-CM

## 2012-08-12 DIAGNOSIS — O0990 Supervision of high risk pregnancy, unspecified, unspecified trimester: Secondary | ICD-10-CM

## 2012-08-12 LAB — BASIC METABOLIC PANEL
CO2: 23 mEq/L (ref 19–32)
Calcium: 9.7 mg/dL (ref 8.4–10.5)
GFR calc non Af Amer: >90 mL/min (ref >90)
Glucose, Bld: 110 mg/dL — ABNORMAL HIGH (ref 70–99)
Potassium: 4.2 mEq/L (ref 3.5–5.1)
Sodium: 134 mEq/L — ABNORMAL LOW (ref 135–145)

## 2012-08-12 LAB — CBC
HCT: 39.5 % (ref 36.0–46.0)
MCH: 24.8 pg — ABNORMAL LOW (ref 26.0–34.0)
MCV: 75.2 fL — ABNORMAL LOW (ref 78.0–100.0)
Platelets: 357 10*3/uL (ref 150–400)
RDW: 17.8 % — ABNORMAL HIGH (ref 11.5–15.5)

## 2012-08-12 LAB — SURGICAL PCR SCREEN: Staphylococcus aureus: NEGATIVE

## 2012-08-12 LAB — ABO/RH: ABO/RH(D): O POS

## 2012-08-12 NOTE — Patient Instructions (Addendum)
20 [name]  08/12/2012   Your procedure is scheduled on:  08/17/12  Enter through the Main Entrance of [facility] at 2:15 PM  Pick up the phone at the desk and dial [phone].   Call this number if you have problems the morning of surgery: [phone]   Remember:   Do not eat food:After Midnight.  Do not drink clear liquids: 4 Hours before arrival.  Take these medicines the morning of surgery with A SIP OF WATER: NA  Take 10units of insulin morning (both [place]) day of surgery.  Per Dr. Cristela Blue   Do not wear jewelry, make-up or nail polish.  Do not wear lotions, powders, or perfumes. You may wear deodorant.  Do not shave 48 hours prior to surgery.  Do not bring valuables to the hospital.  Contacts, dentures or bridgework may not be worn into surgery.  Leave suitcase in the car. After surgery it may be brought to your room.  For patients admitted to the hospital, checkout time is 11:00 AM the day of discharge.   Patients discharged the day of surgery will not be allowed to drive home.  Name and phone number of your driver: NA  Special Instructions: CHG Shower Use Special Wash: 1/2 bottle night before surgery and 1/2 bottle morning of surgery.   Please read over the following fact sheets that you were given: MRSA Information

## 2012-08-13 LAB — RPR: RPR Ser Ql: NONREACTIVE

## 2012-08-13 NOTE — Progress Notes (Signed)
NST reactive on 07/31/12 

## 2012-08-14 ENCOUNTER — Ambulatory Visit (INDEPENDENT_AMBULATORY_CARE_PROVIDER_SITE_OTHER): Payer: Self-pay | Admitting: *Deleted

## 2012-08-14 VITALS — BP 125/72 | Wt 276.4 lb

## 2012-08-14 DIAGNOSIS — O99810 Abnormal glucose complicating pregnancy: Secondary | ICD-10-CM

## 2012-08-14 NOTE — Progress Notes (Signed)
P = 73    Repeat C/S scheduled 08/17/12

## 2012-08-16 MED ORDER — DEXTROSE 5 % IV SOLN
3.0000 g | INTRAVENOUS | Status: DC
  Filled 2012-08-16: qty 3000

## 2012-08-17 ENCOUNTER — Encounter (HOSPITAL_COMMUNITY): Payer: Self-pay | Admitting: *Deleted

## 2012-08-17 ENCOUNTER — Inpatient Hospital Stay (HOSPITAL_COMMUNITY)
Admission: AD | Admit: 2012-08-17 | Discharge: 2012-08-19 | DRG: 766 | Disposition: A | Discharge status: Home / Self Care | Payer: Medicaid Other | Source: Ambulatory Visit | Attending: Family Medicine | Admitting: Family Medicine

## 2012-08-17 ENCOUNTER — Encounter (HOSPITAL_COMMUNITY): Payer: Self-pay | Admitting: Anesthesiology

## 2012-08-17 ENCOUNTER — Inpatient Hospital Stay (HOSPITAL_COMMUNITY): Payer: Medicaid Other | Admitting: Anesthesiology

## 2012-08-17 ENCOUNTER — Encounter (HOSPITAL_COMMUNITY)
Admission: AD | Disposition: A | Discharge status: Home / Self Care | Payer: Self-pay | Source: Ambulatory Visit | Attending: Family Medicine

## 2012-08-17 DIAGNOSIS — O0990 Supervision of high risk pregnancy, unspecified, unspecified trimester: Secondary | ICD-10-CM

## 2012-08-17 DIAGNOSIS — O99210 Obesity complicating pregnancy, unspecified trimester: Secondary | ICD-10-CM

## 2012-08-17 DIAGNOSIS — O34219 Maternal care for unspecified type scar from previous cesarean delivery: Principal | ICD-10-CM | POA: Diagnosis present

## 2012-08-17 DIAGNOSIS — Z98891 History of uterine scar from previous surgery: Secondary | ICD-10-CM

## 2012-08-17 DIAGNOSIS — O99814 Abnormal glucose complicating childbirth: Secondary | ICD-10-CM | POA: Diagnosis present

## 2012-08-17 LAB — TYPE AND SCREEN: Antibody Screen: NEGATIVE

## 2012-08-17 SURGERY — Surgical Case
Anesthesia: Epidural | Site: Abdomen | Wound class: Clean Contaminated

## 2012-08-17 MED ORDER — IBUPROFEN 600 MG PO TABS
600.0000 mg | ORAL_TABLET | Freq: Four times a day (QID) | ORAL | Status: DC
  Administered 2012-08-18 – 2012-08-19 (×5): 600 mg via ORAL
  Filled 2012-08-17 (×5): qty 1

## 2012-08-17 MED ORDER — LACTATED RINGERS IV SOLN
INTRAVENOUS | Status: DC
  Administered 2012-08-17: 125 mL/h via INTRAVENOUS

## 2012-08-17 MED ORDER — PHENYLEPHRINE 40 MCG/ML (10ML) SYRINGE FOR IV PUSH (FOR BLOOD PRESSURE SUPPORT)
PREFILLED_SYRINGE | INTRAVENOUS | Status: AC
  Filled 2012-08-17: qty 5

## 2012-08-17 MED ORDER — OXYCODONE-ACETAMINOPHEN 5-325 MG PO TABS
1.0000 | ORAL_TABLET | ORAL | Status: DC | PRN
  Administered 2012-08-18: 1 via ORAL
  Administered 2012-08-19: 2 via ORAL
  Filled 2012-08-17: qty 2
  Filled 2012-08-17: qty 1

## 2012-08-17 MED ORDER — METHYLERGONOVINE MALEATE 0.2 MG/ML IJ SOLN
INTRAMUSCULAR | Status: AC
  Filled 2012-08-17: qty 1

## 2012-08-17 MED ORDER — ONDANSETRON HCL 4 MG/2ML IJ SOLN: 4.0000 mg | Freq: Three times a day (TID) | INTRAMUSCULAR | Status: DC | PRN

## 2012-08-17 MED ORDER — DIPHENHYDRAMINE HCL 50 MG/ML IJ SOLN
25.0000 mg | INTRAMUSCULAR | Status: DC | PRN
Start: 2012-08-17 — End: 2012-08-19

## 2012-08-17 MED ORDER — METOCLOPRAMIDE HCL 5 MG/ML IJ SOLN: 10.0000 mg | Freq: Three times a day (TID) | INTRAMUSCULAR | Status: DC | PRN

## 2012-08-17 MED ORDER — LACTATED RINGERS IV SOLN: INTRAVENOUS | Status: DC

## 2012-08-17 MED ORDER — SODIUM CHLORIDE 0.9 % IJ SOLN: 3.0000 mL | INTRAMUSCULAR | Status: DC | PRN

## 2012-08-17 MED ORDER — DEXTROSE 5 % IV SOLN
3.0000 g | INTRAVENOUS | Status: DC | PRN
  Administered 2012-08-17: 3 g via INTRAVENOUS

## 2012-08-17 MED ORDER — ONDANSETRON HCL 4 MG/2ML IJ SOLN
INTRAMUSCULAR | Status: DC | PRN
  Administered 2012-08-17: 4 mg via INTRAVENOUS

## 2012-08-17 MED ORDER — NALOXONE HCL 0.4 MG/ML IJ SOLN: 0.4000 mg | INTRAMUSCULAR | Status: DC | PRN

## 2012-08-17 MED ORDER — MENTHOL 3 MG MT LOZG: 1.0000 | LOZENGE | OROMUCOSAL | Status: DC | PRN

## 2012-08-17 MED ORDER — EPHEDRINE 5 MG/ML INJ
INTRAVENOUS | Status: AC
  Filled 2012-08-17: qty 10

## 2012-08-17 MED ORDER — OXYTOCIN 10 UNIT/ML IJ SOLN
INTRAMUSCULAR | Status: AC
  Filled 2012-08-17: qty 4

## 2012-08-17 MED ORDER — KETOROLAC TROMETHAMINE 30 MG/ML IJ SOLN
30.0000 mg | Freq: Four times a day (QID) | INTRAMUSCULAR | Status: AC | PRN
  Administered 2012-08-18: 30 mg via INTRAVENOUS
  Filled 2012-08-17: qty 1

## 2012-08-17 MED ORDER — KETOROLAC TROMETHAMINE 60 MG/2ML IM SOLN
60.0000 mg | Freq: Once | INTRAMUSCULAR | Status: AC | PRN
  Administered 2012-08-17: 60 mg via INTRAMUSCULAR

## 2012-08-17 MED ORDER — ONDANSETRON HCL 4 MG PO TABS: 4.0000 mg | ORAL_TABLET | ORAL | Status: DC | PRN

## 2012-08-17 MED ORDER — EPHEDRINE SULFATE 50 MG/ML IJ SOLN
INTRAMUSCULAR | Status: DC | PRN
  Administered 2012-08-17 (×3): 10 mg via INTRAVENOUS

## 2012-08-17 MED ORDER — ONDANSETRON HCL 4 MG/2ML IJ SOLN
INTRAMUSCULAR | Status: AC
  Filled 2012-08-17: qty 2

## 2012-08-17 MED ORDER — METHYLERGONOVINE MALEATE 0.2 MG/ML IJ SOLN
INTRAMUSCULAR | Status: DC | PRN
  Administered 2012-08-17: 0.2 mg via INTRAMUSCULAR

## 2012-08-17 MED ORDER — LACTATED RINGERS IV SOLN
INTRAVENOUS | Status: DC | PRN
  Administered 2012-08-17 (×3): via INTRAVENOUS

## 2012-08-17 MED ORDER — SCOPOLAMINE 1 MG/3DAYS TD PT72
MEDICATED_PATCH | TRANSDERMAL | Status: AC
  Administered 2012-08-17: 1.5 mg
  Filled 2012-08-17: qty 1

## 2012-08-17 MED ORDER — SODIUM CHLORIDE 0.9 % IV SOLN: 1.0000 ug/kg/h | INTRAVENOUS | Status: DC | PRN

## 2012-08-17 MED ORDER — LANOLIN HYDROUS EX OINT: 1.0000 "application " | TOPICAL_OINTMENT | CUTANEOUS | Status: DC | PRN

## 2012-08-17 MED ORDER — TETANUS-DIPHTH-ACELL PERTUSSIS 5-2.5-18.5 LF-MCG/0.5 IM SUSP
0.5000 mL | Freq: Once | INTRAMUSCULAR | Status: AC
  Administered 2012-08-19: 0.5 mL via INTRAMUSCULAR
  Filled 2012-08-17: qty 0.5

## 2012-08-17 MED ORDER — WITCH HAZEL-GLYCERIN EX PADS: 1.0000 "application " | MEDICATED_PAD | CUTANEOUS | Status: DC | PRN

## 2012-08-17 MED ORDER — SIMETHICONE 80 MG PO CHEW
80.0000 mg | CHEWABLE_TABLET | ORAL | Status: DC | PRN
  Administered 2012-08-18: 80 mg via ORAL

## 2012-08-17 MED ORDER — ONDANSETRON HCL 4 MG/2ML IJ SOLN: 4.0000 mg | INTRAMUSCULAR | Status: DC | PRN

## 2012-08-17 MED ORDER — MORPHINE SULFATE 0.5 MG/ML IJ SOLN
INTRAMUSCULAR | Status: AC
  Filled 2012-08-17: qty 10

## 2012-08-17 MED ORDER — NALBUPHINE HCL 10 MG/ML IJ SOLN: 5.0000 mg | INTRAMUSCULAR | Status: DC | PRN

## 2012-08-17 MED ORDER — LACTATED RINGERS IV SOLN
INTRAVENOUS | Status: DC | PRN
  Administered 2012-08-17: 17:00:00 via INTRAVENOUS

## 2012-08-17 MED ORDER — PRENATAL MULTIVITAMIN CH
1.0000 | ORAL_TABLET | Freq: Every day | ORAL | Status: DC
  Administered 2012-08-18: 1 via ORAL
  Filled 2012-08-17: qty 1

## 2012-08-17 MED ORDER — DIPHENHYDRAMINE HCL 25 MG PO CAPS
25.0000 mg | ORAL_CAPSULE | ORAL | Status: DC | PRN
  Administered 2012-08-18: 25 mg via ORAL
  Filled 2012-08-17: qty 1

## 2012-08-17 MED ORDER — KETOROLAC TROMETHAMINE 30 MG/ML IJ SOLN: 30.0000 mg | Freq: Four times a day (QID) | INTRAMUSCULAR | Status: AC | PRN

## 2012-08-17 MED ORDER — DIBUCAINE 1 % RE OINT: 1.0000 "application " | TOPICAL_OINTMENT | RECTAL | Status: DC | PRN

## 2012-08-17 MED ORDER — OXYTOCIN 40 UNITS IN LACTATED RINGERS INFUSION - SIMPLE MED
INTRAVENOUS | Status: DC | PRN
  Administered 2012-08-17: 40 [IU] via INTRAVENOUS

## 2012-08-17 MED ORDER — KETOROLAC TROMETHAMINE 60 MG/2ML IM SOLN
INTRAMUSCULAR | Status: AC
  Administered 2012-08-17: 60 mg via INTRAMUSCULAR
  Filled 2012-08-17: qty 2

## 2012-08-17 MED ORDER — MEPERIDINE HCL 25 MG/ML IJ SOLN: 6.2500 mg | INTRAMUSCULAR | Status: DC | PRN

## 2012-08-17 MED ORDER — HYDROMORPHONE HCL PF 1 MG/ML IJ SOLN: 0.2500 mg | INTRAMUSCULAR | Status: DC | PRN

## 2012-08-17 MED ORDER — DIPHENHYDRAMINE HCL 25 MG PO CAPS: 25.0000 mg | ORAL_CAPSULE | Freq: Four times a day (QID) | ORAL | Status: DC | PRN

## 2012-08-17 MED ORDER — BUPIVACAINE IN DEXTROSE 0.75-8.25 % IT SOLN
INTRATHECAL | Status: AC
  Filled 2012-08-17: qty 2

## 2012-08-17 MED ORDER — MEASLES, MUMPS & RUBELLA VAC ~~LOC~~ INJ: 0.5000 mL | INJECTION | Freq: Once | SUBCUTANEOUS | Status: DC

## 2012-08-17 MED ORDER — DIPHENHYDRAMINE HCL 50 MG/ML IJ SOLN: 12.5000 mg | INTRAMUSCULAR | Status: DC | PRN

## 2012-08-17 MED ORDER — SCOPOLAMINE 1 MG/3DAYS TD PT72: 1.0000 | MEDICATED_PATCH | Freq: Once | TRANSDERMAL | Status: DC

## 2012-08-17 MED ORDER — OXYTOCIN 40 UNITS IN LACTATED RINGERS INFUSION - SIMPLE MED: 62.5000 mL/h | INTRAVENOUS | Status: AC

## 2012-08-17 MED ORDER — LACTATED RINGERS IV SOLN
INTRAVENOUS | Status: DC
  Administered 2012-08-18: via INTRAVENOUS

## 2012-08-17 MED ORDER — FENTANYL CITRATE 0.05 MG/ML IJ SOLN
INTRAMUSCULAR | Status: AC
  Filled 2012-08-17: qty 2

## 2012-08-17 MED ORDER — IBUPROFEN 600 MG PO TABS: 600.0000 mg | ORAL_TABLET | Freq: Four times a day (QID) | ORAL | Status: DC | PRN

## 2012-08-17 SURGICAL SUPPLY — 31 items
CLOTH BEACON ORANGE TIMEOUT ST (SAFETY) ×2 IMPLANT
CONTAINER PREFILL 10% NBF 15ML (MISCELLANEOUS) IMPLANT
DRAIN JACKSON PRT FLT 7MM (DRAIN) IMPLANT
DRAPE SURG 17X23 STRL (DRAPES) ×2 IMPLANT
DRESSING TELFA 8X3 (GAUZE/BANDAGES/DRESSINGS) IMPLANT
DRSG COVADERM 4X10 (GAUZE/BANDAGES/DRESSINGS) IMPLANT
DURAPREP 26ML APPLICATOR (WOUND CARE) ×2 IMPLANT
ELECT REM PT RETURN 9FT ADLT (ELECTROSURGICAL) ×2
ELECTRODE REM PT RTRN 9FT ADLT (ELECTROSURGICAL) ×1 IMPLANT
EVACUATOR SILICONE 100CC (DRAIN) IMPLANT
EXTRACTOR VACUUM M CUP 4 TUBE (SUCTIONS) IMPLANT
GAUZE SPONGE 4X4 12PLY STRL LF (GAUZE/BANDAGES/DRESSINGS) ×4 IMPLANT
GLOVE BIO SURGEON STRL SZ7 (GLOVE) ×2 IMPLANT
GLOVE BIOGEL PI IND STRL 7.0 (GLOVE) ×2 IMPLANT
GLOVE BIOGEL PI INDICATOR 7.0 (GLOVE) ×2
GOWN PREVENTION PLUS LG XLONG (DISPOSABLE) ×6 IMPLANT
KIT ABG SYR 3ML LUER SLIP (SYRINGE) IMPLANT
NEEDLE HYPO 25X5/8 SAFETYGLIDE (NEEDLE) ×2 IMPLANT
NS IRRIG 1000ML POUR BTL (IV SOLUTION) ×2 IMPLANT
PACK C SECTION WH (CUSTOM PROCEDURE TRAY) ×2 IMPLANT
PAD ABD 7.5X8 STRL (GAUZE/BANDAGES/DRESSINGS) IMPLANT
PAD OB MATERNITY 4.3X12.25 (PERSONAL CARE ITEMS) IMPLANT
RTRCTR C-SECT PINK 25CM LRG (MISCELLANEOUS) ×2 IMPLANT
SLEEVE SCD COMPRESS KNEE MED (MISCELLANEOUS) IMPLANT
STAPLER VISISTAT 35W (STAPLE) IMPLANT
SUT VIC AB 0 CTX 36 (SUTURE) ×5
SUT VIC AB 0 CTX36XBRD ANBCTRL (SUTURE) ×5 IMPLANT
SUT VIC AB 4-0 KS 27 (SUTURE) IMPLANT
TOWEL OR 17X24 6PK STRL BLUE (TOWEL DISPOSABLE) ×4 IMPLANT
TRAY FOLEY CATH 14FR (SET/KITS/TRAYS/PACK) ×2 IMPLANT
WATER STERILE IRR 1000ML POUR (IV SOLUTION) ×2 IMPLANT

## 2012-08-17 NOTE — Op Note (Signed)
[name] PROCEDURE DATE: 08/17/2012  PREOPERATIVE DIAGNOSIS: Intrauterine pregnancy at  [redacted]w[redacted]d weeks gestation for elective repeat cesarean section  POSTOPERATIVE DIAGNOSIS: The same  PROCEDURE:    Low Transverse Cesarean Section  SURGEON:  Dr. Elsie Lincoln  ASSISTANT: Dr. Jaynie Collins  INDICATIONS: Maria Daniel is a 31 y.o. [id number] at [redacted]w[redacted]d scheduled for cesarean section secondary to desires elective repeat.  The risks of cesarean section discussed with the patient included but were not limited to: bleeding which may require transfusion or reoperation; infection which may require antibiotics; injury to bowel, bladder, ureters or other surrounding organs; injury to the fetus; need for additional procedures including hysterectomy in the event of a life-threatening hemorrhage; placental abnormalities wth subsequent pregnancies, incisional problems, thromboembolic phenomenon and other postoperative/anesthesia complications. The patient concurred with the proposed plan, giving informed written consent for the procedure.    FINDINGS:  Viable female infant in cephalic presentation clear amniotic fluid.  Intact placenta, three vessel cord.  Grossly normal uterus, ovaries and fallopian tubes. .   ANESTHESIA:    Epidural ESTIMATED BLOOD LOSS: 800 cc SPECIMENS: Placenta sent to L&D COMPLICATIONS: None immediate  PROCEDURE IN DETAIL:  The patient received intravenous antibiotics and had sequential compression devices applied to her lower extremities while in the preoperative area.  She was then taken to the operating room where Combined spinal epidural anesthesia was administered and was found to be adequate. She was then placed in a dorsal supine position with a leftward tilt, and prepped and draped in a sterile manner.  A foley catheter was placed into her bladder and attached to constant gravity.  After an adequate timeout was performed, a Pfannenstiel skin incision was made  with scalpel and carried through to the underlying layer of fascia. The fascia was incised in the midline and this incision was extended bilaterally using the Mayo scissors. Kocher clamps were applied to the superior aspect of the fascial incision and the underlying rectus muscles were dissected off bluntly. A similar process was carried out on the inferior aspect of the facial incision. The rectus muscles were separated in the midline bluntly and the peritoneum was entered bluntly.   A transverse hysterotomy was made with a scalpel and extended bilaterally bluntly. The bladder blade was then removed. The infant was successfully delivered, and cord was clamped and cut and infant was handed over to awaiting neonatology team. Uterine massage was then administered and the placenta delivered intact with three-vessel cord. The uterus was cleared of clot and debris.  The hysterotomy was closed with 0 vicryl.  A second imbricating suture of 0-Vicryl was used to reinforce the incision and aid in hemostasis.  The peritoneum and rectus muscles were noted to be hemostatic.  The fascia was closed with 0-Vicryl in a running fashion with good restoration of anatomy.  The subcutaneus tissue was copiously irrigated and re approximated with plain suture.  The skin was closed with staples.  Pt tolerated the procedure will.  All counts were correct x2.  Pt went to the recovery room in stable condition.

## 2012-08-17 NOTE — Anesthesia Postprocedure Evaluation (Signed)
Anesthesia Post Note  Patient: Maria Daniel  Procedure(s) Performed: Procedure(s) (LRB): CESAREAN SECTION (N/A)  Anesthesia type: Spinal  Patient location: PACU  Post pain: Pain level controlled  Post assessment: Post-op Vital signs reviewed  Last Vitals:  Filed Vitals:   08/17/12 1915  BP: 145/99  Pulse: 76  Temp:   Resp: 25    Post vital signs: Reviewed  Level of consciousness: awake  Complications: No apparent anesthesia complications

## 2012-08-17 NOTE — Transfer of Care (Signed)
Immediate Anesthesia Transfer of Care Note  Patient: Maria Daniel  Procedure(s) Performed: Procedure(s) (LRB) with comments: CESAREAN SECTION (N/A)  Patient Location: PACU  Anesthesia Type: Spinal and Epidural  Level of Consciousness: awake, alert  and oriented  Airway & Oxygen Therapy: Patient Spontanous Breathing  Post-op Assessment: Report given to PACU RN and Post -op Vital signs reviewed and stable  Post vital signs: Reviewed and stable  Complications: No apparent anesthesia complications

## 2012-08-17 NOTE — H&P (Signed)
Maria Daniel is a 31 y.o. female presenting for elective rpt c/s at 39 weeks. History OB History    Grav Para Term Preterm Abortions TAB SAB Ect Mult Living   3 1 1  1  1   1      Past Medical History  Diagnosis Date  . Miscarriage   . Diabetes mellitus   . Hypertension     no meds patient denies   Past Surgical History  Procedure Date  . Cesarean section    Family History: family history includes Hypertension in her mother; Kidney disease in her father; and Multiple sclerosis in her father. Social History:  reports that she has never smoked. She has never used smokeless tobacco. She reports that she drinks alcohol. She reports that she does not use illicit drugs.   Prenatal Transfer Tool  Maternal Diabetes: Yes:  Diabetes Type:  Insulin/Medication controlled; Pt diagnosed at 19 weeks and treated as Type A2/B Genetic Screening: Normal Maternal Ultrasounds/Referrals: Normal Fetal Ultrasounds or other Referrals:  Negative fetal echo Maternal Substance Abuse:  No Significant Maternal Medications:  None Significant Maternal Lab Results:  None Other Comments:  None  Review of Systems  Constitutional: Negative.   Respiratory: Negative.   Gastrointestinal: Negative.   Genitourinary: Negative.   Skin: Negative.   Neurological: Negative.   Psychiatric/Behavioral: Negative.       Last menstrual period 11/07/2011. Exam Physical Exam  Vitals reviewed. Constitutional: She is oriented to person, place, and time. She appears well-developed and well-nourished. No distress.  HENT:  Head: Normocephalic and atraumatic.  Mouth/Throat: No oropharyngeal exudate.  Eyes: Conjunctivae normal are normal.  Neck: Neck supple. No thyromegaly present.  Cardiovascular: Normal rate and regular rhythm.   Respiratory: Breath sounds normal.  GI: Soft. She exhibits no distension. There is no tenderness.  Musculoskeletal: Normal range of motion.  Neurological: She is alert and oriented  to person, place, and time.  Skin: Skin is warm and dry.  Psychiatric: She has a normal mood and affect.    Prenatal labs: ABO, Rh: --/--/O POS (09/18 1030) Antibody: NEG (09/18 1022) Rubella: Immune (05/13 0000) RPR: NON REACTIVE (09/18 1023)  HBsAg: Negative (05/13 0000)  HIV: NON REACTIVE (06/17 1043)  GBS:   Negative  Assessment/Plan: Maria Daniel is a 31 y.o. female presenting for elective rpt c/s at 39 weeks.  The risks of cesarean section discussed with the patient included but were not limited to: bleeding which may require transfusion or reoperation; infection which may require antibiotics; injury to bowel, bladder, ureters or other surrounding organs; injury to the fetus; need for additional procedures including hysterectomy in the event of a life-threatening hemorrhage; placental abnormalities wth subsequent pregnancies, incisional problems, thromboembolic phenomenon and other postoperative/anesthesia complications. The patient concurred with the proposed plan, giving informed written consent for the procedure.     Michaiah Maiden H. 08/17/2012, 2:39 PM

## 2012-08-17 NOTE — Anesthesia Preprocedure Evaluation (Addendum)
Anesthesia Evaluation  Patient identified by MRN, date of birth, ID band Patient awake    Reviewed: Allergy & Precautions, H&P , Patient's Chart, lab work & pertinent test results  Airway Mallampati: II TM Distance: >3 FB Neck ROM: full    Dental No notable dental hx.    Pulmonary  breath sounds clear to auscultation  Pulmonary exam normal       Cardiovascular Exercise Tolerance: Good Rhythm:regular Rate:Normal     Neuro/Psych    GI/Hepatic   Endo/Other  diabetes, Type 1, Insulin DependentMorbid obesity  Renal/GU      Musculoskeletal   Abdominal   Peds  Hematology   Anesthesia Other Findings   Reproductive/Obstetrics                          Anesthesia Physical Anesthesia Plan  ASA: III  Anesthesia Plan: Spinal, Combined Spinal and Epidural and Epidural   Post-op Pain Management:    Induction:   Airway Management Planned:   Additional Equipment:   Intra-op Plan:   Post-operative Plan:   Informed Consent: I have reviewed the patients History and Physical, chart, labs and discussed the procedure including the risks, benefits and alternatives for the proposed anesthesia with the patient or authorized representative who has indicated his/her understanding and acceptance.   Dental Advisory Given  Plan Discussed with: CRNA  Anesthesia Plan Comments: (Lab work confirmed with CRNA in room. Platelets okay. Discussed spinal anesthetic, and patient consents to the procedure:  included risk of possible headache,backache, failed block, allergic reaction, and nerve injury. This patient was asked if she had any questions or concerns before the procedure started. )        Anesthesia Quick Evaluation

## 2012-08-17 NOTE — Anesthesia Procedure Notes (Signed)
Spinal  Patient location during procedure: OR Preanesthetic Checklist Completed: patient identified, site marked, surgical consent, pre-op evaluation, timeout performed, IV checked, risks and benefits discussed and monitors and equipment checked Spinal Block Patient position: sitting Prep: DuraPrep Patient monitoring: heart rate, cardiac monitor, continuous pulse ox and blood pressure Approach: midline Location: L3-4 Injection technique: single-shot Needle Needle type: Sprotte  Needle gauge: 24 G Needle length: 9 cm Assessment Sensory level: T4 Additional Notes Touhy to 7cm LOR, Sprotte thru touhy......Marland KitchenSpinal Dosage in OR  Bupivicaine ml       1.6 PFMS04   mcg        150 Fentanyl mcg            25  Catheter 7 cm , no parasthesia, (-) asp

## 2012-08-17 NOTE — Progress Notes (Signed)
Used [employer] in [place].

## 2012-08-17 NOTE — Progress Notes (Signed)
NST performed today was reviewed and was found to be reactive.  Continue recommended antenatal testing and prenatal care.  

## 2012-08-18 ENCOUNTER — Encounter (HOSPITAL_COMMUNITY): Payer: Self-pay | Admitting: Obstetrics & Gynecology

## 2012-08-18 LAB — GLUCOSE, CAPILLARY: Glucose-Capillary: 68 mg/dL — ABNORMAL LOW (ref 70–99)

## 2012-08-18 LAB — CBC
HCT: 32.3 % — ABNORMAL LOW (ref 36.0–46.0)
MCV: 77.1 fL — ABNORMAL LOW (ref 78.0–100.0)
RBC: 4.19 MIL/uL (ref 3.87–5.11)
WBC: 10.4 10*3/uL (ref 4.0–10.5)

## 2012-08-18 NOTE — Progress Notes (Signed)
UR Chart review completed.  

## 2012-08-18 NOTE — Progress Notes (Signed)
Subjective: Postpartum Day 1: Cesarean Delivery Patient reports tolerating PO, + flatus and no problems voiding.    Objective: Vital signs in last 24 hours: Temp:  [97.2 F (36.2 C)-99.1 F (37.3 C)] 99.1 F (37.3 C) (09/24 0412) Pulse Rate:  [68-92] 70  (09/24 0412) Resp:  [16-25] 20  (09/24 0412) BP: (93-151)/(50-106) 98/64 mmHg (09/24 0412) SpO2:  [96 %-100 %] 97 % (09/24 0412) Weight:  [125.374 kg (276 lb 6.4 oz)] 125.374 kg (276 lb 6.4 oz) (09/23 2120)  Physical Exam:  General: alert, cooperative and no distress Lochia: appropriate Uterine Fundus: firm Incision: healing well, no significant drainage, no significant erythema DVT Evaluation: No evidence of DVT seen on physical exam.   Basename 08/18/12 0515  HGB 10.4*  HCT 32.3*    Assessment/Plan: Status post Cesarean section. Doing well postoperatively.  Continue current care. Breast; lactation consult No birth control A2GDM- no meds since post delivery    MCGILL,JACQUELYN 08/18/2012, 7:11 AM  I saw and examined patient along with student and agree with above note.   Nas Wafer 08/18/2012 9:19 AM

## 2012-08-19 DIAGNOSIS — Z98891 History of uterine scar from previous surgery: Secondary | ICD-10-CM

## 2012-08-19 LAB — GLUCOSE, CAPILLARY: Glucose-Capillary: 71 mg/dL (ref 70–99)

## 2012-08-19 MED ORDER — OXYCODONE-ACETAMINOPHEN 5-325 MG PO TABS: 1.0000 | ORAL_TABLET | ORAL | Status: DC | PRN

## 2012-08-19 NOTE — Discharge Summary (Signed)
Obstetric Discharge Summary  Postpartum Day 2, c-section  Reason for Admission: cesarean section, elective repeat Prenatal Procedures: none Intrapartum Procedures: cesarean: low cervical, transverse Postpartum Procedures: none Complications-Operative and Postpartum: none Hemoglobin  Date Value Range Status  08/18/2012 10.4* 12.0 - 15.0 g/dL Final  1/61/0960 45.4   Final     HCT  Date Value Range Status  08/18/2012 32.3* 36.0 - 46.0 % Final  04/06/2012 35   Final    Physical Exam:  BP 105/47  Pulse 66  Temp 98 F (36.7 C) (Oral)  Resp 20  Wt 125.374 kg (276 lb 6.4 oz)  SpO2 97%  LMP 11/07/2011   General: alert, cooperative, appears stated age and no distress CV: RRR PULM: CTAB, nl effort Lochia: appropriate ABD: NABS, mildly tender, obese Uterine Fundus: firm Incision: healing well, no significant drainage, no dehiscence, no significant erythema DVT Evaluation: No evidence of DVT seen on physical exam. No cords or calf tenderness. No significant calf/ankle edema.  Discharge Diagnoses: Term Pregnancy-delivered  Discharge Information: Date: 08/19/2012 Activity: pelvic rest Diet: routine Medications: PNV, Colace and Percocet Condition: stable Instructions: refer to practice specific booklet Discharge to: home Pt plans to breastfeed and does not desire birth control.  Declined circumcision. Discussed need to watch diet for sweets and fried foods and slowly begin to lose weight, and increased risk for Type II Diabetes due to h/o Gestational Diabetes. Pt to receive wound vac today and return to office in 7 days for removal.  Newborn Data: Live born female  Birth Weight: 6 lb 14.6 oz (3135 g) APGAR: 9, 9  Home with mother.  Simone Curia 08/19/2012, 9:27 AM  I have seen and examined this patient and I agree with the above. Cam Hai 11:34 PM 08/19/2012

## 2012-08-20 NOTE — Discharge Summary (Signed)
Attestation of Attending Supervision of Advanced Practitioner (CNM/NP): Evaluation and management procedures were performed by the Advanced Practitioner under my supervision and collaboration.  I have reviewed the Advanced Practitioner's note and chart, and I agree with the management and plan.  Dewana Ammirati 08/20/2012 9:03 AM   

## 2012-08-22 ENCOUNTER — Inpatient Hospital Stay (HOSPITAL_COMMUNITY)
Admission: AD | Admit: 2012-08-22 | Discharge: 2012-08-22 | Disposition: A | Discharge status: Home / Self Care | Payer: Self-pay | Source: Ambulatory Visit | Attending: Obstetrics & Gynecology | Admitting: Obstetrics & Gynecology

## 2012-08-22 ENCOUNTER — Encounter (HOSPITAL_COMMUNITY): Payer: Self-pay

## 2012-08-22 DIAGNOSIS — O99893 Other specified diseases and conditions complicating puerperium: Secondary | ICD-10-CM | POA: Insufficient documentation

## 2012-08-22 DIAGNOSIS — O99210 Obesity complicating pregnancy, unspecified trimester: Secondary | ICD-10-CM

## 2012-08-22 DIAGNOSIS — Z98891 History of uterine scar from previous surgery: Secondary | ICD-10-CM

## 2012-08-22 DIAGNOSIS — O0990 Supervision of high risk pregnancy, unspecified, unspecified trimester: Secondary | ICD-10-CM

## 2012-08-22 DIAGNOSIS — K5900 Constipation, unspecified: Secondary | ICD-10-CM | POA: Insufficient documentation

## 2012-08-22 DIAGNOSIS — O909 Complication of the puerperium, unspecified: Secondary | ICD-10-CM | POA: Insufficient documentation

## 2012-08-22 LAB — URINALYSIS, ROUTINE W REFLEX MICROSCOPIC
Bilirubin Urine: NEGATIVE
Glucose, UA: NEGATIVE mg/dL
Specific Gravity, Urine: 1.02 (ref 1.005–1.030)
Urobilinogen, UA: 1 mg/dL (ref 0.0–1.0)

## 2012-08-22 LAB — URINE MICROSCOPIC-ADD ON

## 2012-08-22 MED ORDER — OXYCODONE-ACETAMINOPHEN 5-325 MG PO TABS
1.0000 | ORAL_TABLET | ORAL | Status: DC | PRN
  Administered 2012-08-22: 1 via ORAL
  Filled 2012-08-22: qty 1

## 2012-08-22 MED ORDER — OXYCODONE-ACETAMINOPHEN 5-325 MG PO TABS: 1.0000 | ORAL_TABLET | ORAL | Status: DC | PRN

## 2012-08-22 MED ORDER — BISACODYL 10 MG RE SUPP
10.0000 mg | Freq: Once | RECTAL | Status: AC
  Administered 2012-08-22: 10 mg via RECTAL
  Filled 2012-08-22: qty 1

## 2012-08-22 NOTE — MAU Note (Signed)
Patient states she had bowel movement after administration of Ducolax suppository.

## 2012-08-22 NOTE — MAU Note (Signed)
Patient presents with c/o increased swelling, constipation no bowel movement since Monday, had C/S for failure to progress, right side incisional pain which she describes as throbbing.

## 2012-08-22 NOTE — MAU Provider Note (Signed)
History     CSN: [id number]  Arrival date and time: 08/22/12 1500   None     Chief Complaint  Patient presents with  . Incisional Pain    throbbing on the right side  . Leg Swelling  . Constipation   HPI Comments: 31yo [id number] POD#5 from RLTCS for desired repeat C/S at 39 weeks (on 08/17/12).  Patient mostly came in today because her home wound vac which was placed the day of d/c was beeping off and on all night and she wasn't sure what to do with it.  She also c/o incisional pain, worse on the right, which is throbbing with some burning.  Pain is 8/10 with laying still and worsens to 10/10 with movement.  Has been present since the C/S but has slightly worsened over the past 2 days.  She has tried her po percocet, which may help for a few hours, but then the pain returns. Is also having itching around the wound vac.  Is also constipated- last BM was Monday AM before surgery.  Has been taking colace BID but wants to try a laxative and wasn't sure if that was ok to do.  Is trying to stay hydrated.  Has not tried anything else for this. Is urinating well and passing gas.  Does not have an urge to stool. Is not having any leakage from her rectum. Has not noticed any blood/bleeding.    Constipation The current episode started in the past 7 days. The problem is unchanged. The patient is not on a high fiber diet. She does not exercise regularly. There has not been adequate water intake. Associated symptoms include abdominal pain and flatus. Pertinent negatives include no diarrhea, difficulty urinating, fever, nausea or vomiting. Risk factors include change in medication usage/dosage, obesity, recent dehydration and stress. She has tried stool softeners for the symptoms. The treatment provided no relief. Her past medical history is significant for abdominal surgery.  Abdominal Pain This is a new problem. The current episode started in the past 7 days. The onset quality is gradual. The problem occurs  constantly. The problem has been gradually worsening. The pain is located in the RLQ. The pain is at a severity of 8/10. The quality of the pain is burning (throbbing). Associated symptoms include constipation and flatus. Pertinent negatives include no diarrhea, fever, hematuria, nausea or vomiting. The pain is aggravated by certain positions and movement. The pain is relieved by nothing. She has tried oral narcotic analgesics for the symptoms. The treatment provided mild relief. Her past medical history is significant for abdominal surgery.    OB History    Grav Para Term Preterm Abortions TAB SAB Ect Mult Living   3 2 2  1  1   2       Past Medical History  Diagnosis Date  . Miscarriage   . Diabetes mellitus   . Hypertension     no meds patient denies    Past Surgical History  Procedure Date  . Cesarean section   . Cesarean section 08/17/2012    Procedure: CESAREAN SECTION;  Surgeon: Lesly Dukes, MD;  Location: [facility];  Service: Obstetrics;  Laterality: N/A;    Family History  Problem Relation Age of Onset  . Hypertension Mother   . Kidney disease Father   . Multiple sclerosis Father     History  Substance Use Topics  . Smoking status: Never Smoker   . Smokeless tobacco: Never Used  . Alcohol Use: Yes  socially    Allergies: No Known Allergies  Prescriptions prior to admission  Medication Sig Dispense Refill  . docusate sodium (COLACE) 100 MG capsule Take 100 mg by mouth 2 (two) times daily as needed. For constipation      . oxyCODONE-acetaminophen (PERCOCET/ROXICET) 5-325 MG per tablet Take 1-2 tablets by mouth every 4 (four) hours as needed (moderate - severe pain).  30 tablet  0  . Prenatal Vit-Fe Fumarate-FA (PRENATAL MULTIVITAMIN) 60-1 MG tablet Take 1 tablet by mouth daily.  30 tablet  0    Review of Systems  Constitutional: Negative for fever.  Respiratory: Negative for cough.   Gastrointestinal: Positive for abdominal pain, constipation and flatus.  Negative for nausea, vomiting and diarrhea.  Genitourinary: Negative for hematuria, flank pain and difficulty urinating.   Physical Exam   Blood pressure 123/59, temperature 98.8 F (37.1 C), temperature source Oral, resp. rate 18, height 5' 2.5" (1.588 m), weight 123.469 kg (272 lb 3.2 oz), last menstrual period 11/07/2011, unknown if currently breastfeeding.  Physical Exam  Constitutional: She is oriented to person, place, and time. She appears well-developed and well-nourished. No distress.  HENT:  Head: Normocephalic and atraumatic.  Cardiovascular: Normal rate, regular rhythm, normal heart sounds and intact distal pulses.   No murmur heard. Respiratory: Effort normal and breath sounds normal. No respiratory distress. She has no wheezes.  GI: Soft. Bowel sounds are normal. She exhibits no distension. There is tenderness in the right lower quadrant.       Wound vac in place over lower abdomen; uterus firm and palpated below the umbilicus- mildly tender to palpation  Musculoskeletal: She exhibits edema.  Neurological: She is alert and oriented to person, place, and time.  Skin: Skin is dry.    MAU Course  Procedures  MDM Abdomen appears benign; wound vac in place with no drainage/output Dr. Debroah Loop saw pt as well Attempting to contact [employer] (wound vac company) for instructions on beeping/where to return device once it is removed- pt was told to f/u here on Wed to have device taken off Will give percocet x1 for pain now and suppository for constipation  Assessment and Plan  31 yo [id number] POD#5 s/p RLTCS at 39 weeks for scheduled repeat C/S now with incisional pain and constipation -D/c home s/p suppository for constipation -D/c home with wound vac, assured pt that area around wound vac and incision look good, f/u as scheduled on Wed -Continue percocet PRN incisional pain  Bryant Lipps 08/22/2012, 3:58 PM

## 2012-08-26 ENCOUNTER — Ambulatory Visit (INDEPENDENT_AMBULATORY_CARE_PROVIDER_SITE_OTHER): Payer: Self-pay | Admitting: Advanced Practice Midwife

## 2012-08-26 VITALS — BP 121/76 | HR 85 | Temp 97.1°F | Resp 12 | Wt 267.5 lb

## 2012-08-26 DIAGNOSIS — Z5189 Encounter for other specified aftercare: Secondary | ICD-10-CM

## 2012-08-26 DIAGNOSIS — Z09 Encounter for follow-up examination after completed treatment for conditions other than malignant neoplasm: Secondary | ICD-10-CM

## 2012-08-26 MED ORDER — OXYCODONE-ACETAMINOPHEN 5-325 MG PO TABS: 1.0000 | ORAL_TABLET | Freq: Three times a day (TID) | ORAL | Status: DC | PRN

## 2012-08-26 MED ORDER — IBUPROFEN 600 MG PO TABS: 600.0000 mg | ORAL_TABLET | Freq: Four times a day (QID) | ORAL | Status: DC | PRN

## 2012-08-26 NOTE — Patient Instructions (Signed)
Cuidados de una herida  (Wound Care)  El cuidado de la herida evita el dolor y la infeccin.  Deber aplicarse la vacuna contra el ttanos si:  No recuerda cundo se coloc la vacuna la ltima vez.   Nunca recibi esta vacuna.   La lesin ha [employer].  Si usted necesita aplicarse la vacuna y se niega a recibirla, corre riesgo de contraer ttanos. sta es una enfermedad grave.  CUIDADOS EN EL HOGAR   Slo tome la medicacin segn las indicaciones.   Limpie la herida [employer] con [place] y [place], segn las indicaciones.   Cambie elvendaje) tal como se le indic.   Aplique una crema con medicamento sobre la herida, segn le indique el mdico.   Cmbielo si se moja, se ensucia o huele mal.   Puede tomar Shaune Spittle. No debe tomar baos de inmersin, nadar ni hacer nada que haga que la herida se encuentre debajo del agua.   Mantenga elevada y en reposo la zona lesionada hasta que el dolor y la hinchazn mejoren.   Cumpla con los controles mdicos segn las indicaciones.  SOLICITE AYUDA DE INMEDIATO SI:   Observa que un lquido blanco amarillento (pus) aparece en la zona de la herida.   Los medicamentos no Editor, commissioning.   Hay rayas rojas que salen de la herida.   No puede mover los dedos.   Tiene fiebre.  ASEGRESE DE QUE:   Comprende estas instrucciones.   Controlar su enfermedad.   Solicitar ayuda de inmediato si no mejora o si empeora.  Document Released: 07/10/2011 Document Revised: 10/31/2011 [facility] Patient Information 2012 [place], [place].

## 2012-08-26 NOTE — Progress Notes (Signed)
  Filed Vitals:   08/26/12 1131  BP: 121/76  Pulse: 85  Temp: 97.1 F (36.2 C)  Resp: 12   Wound Vac removed.   Wound intact with staples which were removed.  No drainage. Skin erethematous where adhesive was.  Wants refill on Percocet. Still taking 2 tab q 4 hrs.  Not taking ibuprofen  Warned against addiction. Will refill Percocet 1 every 8 hrs prn #15 Rx motrin 600mg  q 6 hrs

## 2012-09-21 ENCOUNTER — Ambulatory Visit: Payer: Self-pay | Admitting: Obstetrics & Gynecology

## 2012-11-04 ENCOUNTER — Encounter: Payer: Self-pay | Admitting: Obstetrics and Gynecology

## 2012-11-04 ENCOUNTER — Ambulatory Visit: Payer: Self-pay | Admitting: Obstetrics and Gynecology

## 2012-11-04 VITALS — BP 130/87 | HR 71 | Temp 98.8°F | Resp 20 | Ht 63.0 in | Wt 251.1 lb

## 2012-11-04 DIAGNOSIS — Z309 Encounter for contraceptive management, unspecified: Secondary | ICD-10-CM

## 2012-11-04 DIAGNOSIS — O24429 Gestational diabetes mellitus in childbirth, unspecified control: Secondary | ICD-10-CM

## 2012-11-04 MED ORDER — NORGESTIMATE-ETH ESTRADIOL 0.25-35 MG-MCG PO TABS: 1.0000 | ORAL_TABLET | Freq: Every day | ORAL | Status: DC

## 2012-11-04 NOTE — Progress Notes (Unsigned)
Patient ID: Maria Daniel, female   DOB: 08-11-1981, 31 y.o.   MRN: [id number] Pt informed she will need 2hr GTT to determine if she has diabetes.  Pt agrees and will schedule upon d/c today.   Pt states she is taking [place].

## 2012-11-04 NOTE — Patient Instructions (Signed)
Informacin sobre los [employer]  (Oral Contraception Information) Los anticonceptivos orales son medicamentos que se utilizan para Location manager. Su funcin es [employer] ovarios liberen vulos. Las hormonas de los anticonceptivos orales hacen que el moco cervical se haga ms espeso, lo que evita que el esperma ingrese al tero. Tambin hacen que la membrana que tapiza el tero se vuelva ms fina, lo que no permite que el huevo fertilizado se adhiera a la pared del tero. Los anticonceptivos orales son muy efectivos cuando se toman exactamente como se prescriben. Sin embargo, no previenen contra las enfermedades de transmisin sexual (ETS). La prctica del sexo seguro, como el uso de preservativos, junto con la [place], [place] a prevenir ese tipo de enfermedades. Antes de tomar la pldora, usted debe hacerse un examen fsico y un test de Pap. El mdico podr indicar anlisis de sangre si es necesario. El mdico se asegurar de que usted es una buena candidata para usar anticonceptivos orales. Converse con su mdico acerca de los posibles efectos secundarios de los anticonceptivos [place]. Cuando se inicia el uso de anticonceptivos [place], se pueden tomar durante 2 a 3 meses para que el cuerpo se adapte a los cambios en los niveles hormonales en el cuerpo.  HAY DOS TIPOS DE ANTICONCEPTIVOS ORALES  La pldora combinada. Esta pldora contiene las hormonas estrgeno y progestina (progesterona sinttica). La pldora combinada viene en envases para [address] o [address]. En los envases para [address] usted no tomar las pldoras durante 7 [address]  [address] despus de la ltima pldora. En los envases para [address], la pldora se toma [employer]. Las ltimas 7 no contienen hormonas. Ciertos tipos de pldoras tienen ms de 21 pldoras que contienen hormonas.  La minipldora. Esta pldora contiene la hormona progesterona solamente. Es necesario tomarla todos los das de [place] continua. Viene en envases de 91  pldoras. Las primeras 84 contienen hormonas y las 7 ltimas no contienen. En los ltimos 7 das usted tendr su perodo menstrual. Puede experimentar manchado irregular. VENTAJAS   Disminuye los sntomas premenstruales.  Se [place] para tratar los [employer].  Regula el ciclo menstrual.  Disminuye el ciclo menstrual abundante.  Trata el acn.  Trata hemorragias uterinas anormales.  Trata el dolor plvico crnico.  Trata el sndrome ovrico poliqustico.  Trata la endometriosis.  Pueden usarse como anticonceptivos de Sport and exercise psychologist  Pueden ser menos efectivos si:   Olvid tomar la [employer] a la misma hora.  Usted tiene una enfermedad estomacal o intestinal que disminuye la absorcin de la pldora.  Toma anticonceptivos orales junto con otros frmacos que [employer] anticonceptivos sean menos eficaces.  Usted toma anticonceptivos orales que han vencido.  Cuando se [place] el envase de [place], se olvida de recomenzar el uso [employer] 7. Document Released: 08/21/2005 Document Revised: 02/03/2012 [facility] Patient Information 2013 [place], [place].

## 2012-11-04 NOTE — Progress Notes (Signed)
Subjective:     Jeriyah Granlund is a 31 y.o. female who presents for a postpartum visit. She is 11 weeks postpartum following a low cervical transverse Cesarean section. I have fully reviewed the prenatal and intrapartum course. The delivery was at 39 gestational weeks. Outcome: repeat cesarean section, low transverse incision. Anesthesia: epidural. Postpartum course has been d/c home with wound vac. Baby is feeding by breast and bottle. No vaginal bleeding. Bowel function is normal. Bladder function is normal. Patient is not currently sexually active. Planned contraception method is condoms.  Denies vaginal bleeding, abdominal pain, fever.  The following portions of the patient's history were reviewed and updated as appropriate: allergies, current medications, past family history, past medical history, past social history, past surgical history and problem list.  Review of Systems A comprehensive review of systems was negative.   Objective:    BP 130/87  Pulse 71  Temp 98.8 F (37.1 C) (Oral)  Resp 20  Ht 5\' 3"  (1.6 m)  Wt 251 lb 1.6 oz (113.898 kg)  BMI 44.48 kg/m2  Breastfeeding? Yes  General:  alert, cooperative, no distress and mildly obese   Breasts:  lactating, did not examine  Lungs: clear to auscultation bilaterally  Heart:  regular rate and rhythm, S1, S2 normal, no murmur, click, rub or gallop  Abdomen: soft, non-tender; bowel sounds normal; no masses,  no organomegaly   Vulva:  not evaluated  Vagina: not evaluated  Cervix:  not evaluated  Corpus: normal size, contour, position, consistency, mobility, non-tender  Adnexa:  not evaluated  Rectal Exam: Not performed.    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 Contraception: condoms, discussed all contraceptive options. Pt would like to start taking OCP's. Reviewed adverse  effects. Discussed importance of taking the pill everyday and what to do if she misses a dose. Instructed to use condoms for 1 month after starting pill as back-up method. Will prescribe Tri-sprintec.  2. Put in order for 2hr OGTT and TSH, informed pt she will need to f/u with PCP if results are abnormal.  3. Follow up in 6 months to evaluate compliance with OCP's 4. Follow up in May 2014 for pap smear Weight loss recommended and discussed DM diet  Evaluation and management procedures were performed by pa-s under my supervision/collaboration. Chart reviewed, patient examined by me and I agree with management and plan. Danae Orleans, CNM 11/04/2012 3:41 PM

## 2012-11-09 ENCOUNTER — Other Ambulatory Visit (INDEPENDENT_AMBULATORY_CARE_PROVIDER_SITE_OTHER): Payer: Self-pay

## 2012-11-09 DIAGNOSIS — Z23 Encounter for immunization: Secondary | ICD-10-CM

## 2012-11-09 DIAGNOSIS — O99810 Abnormal glucose complicating pregnancy: Secondary | ICD-10-CM

## 2012-11-09 DIAGNOSIS — Z309 Encounter for contraceptive management, unspecified: Secondary | ICD-10-CM

## 2012-11-09 DIAGNOSIS — O24429 Gestational diabetes mellitus in childbirth, unspecified control: Secondary | ICD-10-CM

## 2012-11-09 MED ORDER — NORGESTIMATE-ETH ESTRADIOL 0.25-35 MG-MCG PO TABS: 1.0000 | ORAL_TABLET | Freq: Every day | ORAL | Status: DC

## 2012-11-09 MED ORDER — INFLUENZA VIRUS VACC SPLIT PF IM SUSP: 0.5000 mL | Freq: Once | INTRAMUSCULAR | Status: DC

## 2012-11-10 LAB — GLUCOSE TOLERANCE, 2 HOURS W/ 1HR: Glucose, 1 hour: 223 mg/dL — ABNORMAL HIGH (ref 70–170)

## 2012-11-11 ENCOUNTER — Telehealth: Payer: Self-pay

## 2012-11-11 NOTE — Telephone Encounter (Signed)
Called pt with Spanish interpreter Byrd Hesselbach and informed pt of her abnormal 2 hr and that she would need to be referred to a PCP for management.  Pt stated that she did not have a PCP.  I informed pt that I would fax over a referral to [facility] and they will send out history info that needs to be filled out and sent back to them and they will call with an appt. Pt wanted to know if there is anything that she can do until her appt.  I advised pt on small frequent meals, watching her carb/sugar intake, and exercising.  her Pt stated understanding and did not have any other questions.

## 2012-11-11 NOTE — Telephone Encounter (Signed)
Message copied by Faythe Casa on Wed Nov 11, 2012  2:42 PM ------      Message from: POE, Claudia Desanctis      Created: Tue Nov 10, 2012 12:03 PM       Abnl 2 hr consistent with probable DM. Needs F/U with PCP

## 2013-05-02 ENCOUNTER — Encounter (HOSPITAL_COMMUNITY): Payer: Self-pay | Admitting: Emergency Medicine

## 2013-05-02 ENCOUNTER — Emergency Department (HOSPITAL_COMMUNITY)
Admission: EM | Admit: 2013-05-02 | Discharge: 2013-05-02 | Disposition: A | Discharge status: Home / Self Care | Payer: Self-pay | Attending: Emergency Medicine | Admitting: Emergency Medicine

## 2013-05-02 DIAGNOSIS — Z8742 Personal history of other diseases of the female genital tract: Secondary | ICD-10-CM | POA: Insufficient documentation

## 2013-05-02 DIAGNOSIS — E119 Type 2 diabetes mellitus without complications: Secondary | ICD-10-CM | POA: Insufficient documentation

## 2013-05-02 DIAGNOSIS — L600 Ingrowing nail: Secondary | ICD-10-CM

## 2013-05-02 DIAGNOSIS — I10 Essential (primary) hypertension: Secondary | ICD-10-CM | POA: Insufficient documentation

## 2013-05-02 NOTE — ED Notes (Signed)
Used language line to communicate with patient speaking spanish.  Right greater toe and nail bed pain worsening overtime.

## 2013-05-02 NOTE — ED Provider Notes (Signed)
History     CSN: [id number]  Arrival date & time 05/02/13  1202   First MD Initiated Contact with Patient 05/02/13 1209      Chief Complaint  Patient presents with  . Toe Pain    (Consider location/radiation/quality/duration/timing/severity/associated sxs/prior treatment) HPI Patient presents emergency department with right toenail discomfort.  Patient, states she has an ingrown toenail.  Patient denies nausea, vomiting, fever, weakness, or numbness of the foot.  The patient, states, that she's had previous ingrown toenails.  Patient, states, that palpation makes the pain, worse. Past Medical History  Diagnosis Date  . Miscarriage   . Diabetes mellitus   . Hypertension     no meds patient denies    Past Surgical History  Procedure Laterality Date  . Cesarean section    . Cesarean section  08/17/2012    Procedure: CESAREAN SECTION;  Surgeon: Lesly Dukes, MD;  Location: [facility];  Service: Obstetrics;  Laterality: N/A;    Family History  Problem Relation Age of Onset  . Hypertension Mother   . Kidney disease Father   . Multiple sclerosis Father     History  Substance Use Topics  . Smoking status: Never Smoker   . Smokeless tobacco: Never Used  . Alcohol Use: Yes     Comment: socially    OB History   Grav Para Term Preterm Abortions TAB SAB Ect Mult Living   3 2 2  1  1   2       Review of Systems All other systems negative except as documented in the HPI. All pertinent positives and negatives as reviewed in the HPI. Allergies  Review of patient's allergies indicates no known allergies.  Home Medications  No current outpatient prescriptions on file.  BP 136/96  Pulse 106  Temp(Src) 99 F (37.2 C) (Oral)  Resp 18  SpO2 97%  Physical Exam  Nursing note and vitals reviewed. Constitutional: She is oriented to person, place, and time. She appears well-developed and well-nourished. No distress.  Musculoskeletal:       Right foot: She exhibits tenderness.         Feet:  Neurological: She is alert and oriented to person, place, and time.  Skin: Skin is warm and dry.    ED Course  NAIL REMOVAL Date/Time: 05/02/2013 2:40 PM Performed by: Carlyle Dolly Authorized by: Carlyle Dolly Consent: Verbal consent obtained. Risks and benefits: risks, benefits and alternatives were discussed Consent given by: patient Patient understanding: patient states understanding of the procedure being performed Patient consent: the patient's understanding of the procedure matches consent given Procedure consent: procedure consent matches procedure scheduled Relevant documents: relevant documents present and verified Test results: test results available and properly labeled Patient identity confirmed: verbally with patient Location: right foot Anesthesia: digital block Local anesthetic: lidocaine 2% without epinephrine Anesthetic total: 6 ml Patient sedated: no Preparation: skin prepped with alcohol Amount removed: 1/4 Nail bed sutured: no Nail matrix removed: none Dressing: 4x4 and antibiotic ointment   (including critical care time) Patient history is obtained through the interpreter phone.  Patient will be given podiatry follow up.  Told to return here as needed.  Keep Clean and dry.  Soak in warm water and Epsom salts MDM          Carlyle Dolly, PA-C 05/02/13 1442

## 2013-05-02 NOTE — ED Provider Notes (Signed)
Medical screening examination/treatment/procedure(s) were performed by non-physician practitioner and as supervising physician I was immediately available for consultation/collaboration.   Glynn Octave, MD 05/02/13 [phone]

## 2013-10-30 ENCOUNTER — Emergency Department (HOSPITAL_COMMUNITY)
Admission: EM | Admit: 2013-10-30 | Discharge: 2013-10-30 | Disposition: A | Discharge status: Home / Self Care | Payer: Self-pay | Attending: Emergency Medicine | Admitting: Emergency Medicine

## 2013-10-30 ENCOUNTER — Emergency Department (HOSPITAL_COMMUNITY): Payer: Self-pay

## 2013-10-30 ENCOUNTER — Encounter (HOSPITAL_COMMUNITY): Payer: Self-pay | Admitting: Emergency Medicine

## 2013-10-30 DIAGNOSIS — S59909A Unspecified injury of unspecified elbow, initial encounter: Secondary | ICD-10-CM | POA: Insufficient documentation

## 2013-10-30 DIAGNOSIS — Y939 Activity, unspecified: Secondary | ICD-10-CM | POA: Insufficient documentation

## 2013-10-30 DIAGNOSIS — S4980XA Other specified injuries of shoulder and upper arm, unspecified arm, initial encounter: Secondary | ICD-10-CM | POA: Insufficient documentation

## 2013-10-30 DIAGNOSIS — I10 Essential (primary) hypertension: Secondary | ICD-10-CM | POA: Insufficient documentation

## 2013-10-30 DIAGNOSIS — Y92009 Unspecified place in unspecified non-institutional (private) residence as the place of occurrence of the external cause: Secondary | ICD-10-CM | POA: Insufficient documentation

## 2013-10-30 DIAGNOSIS — W1809XA Striking against other object with subsequent fall, initial encounter: Secondary | ICD-10-CM | POA: Insufficient documentation

## 2013-10-30 DIAGNOSIS — Z3202 Encounter for pregnancy test, result negative: Secondary | ICD-10-CM | POA: Insufficient documentation

## 2013-10-30 DIAGNOSIS — S6990XA Unspecified injury of unspecified wrist, hand and finger(s), initial encounter: Secondary | ICD-10-CM | POA: Insufficient documentation

## 2013-10-30 DIAGNOSIS — M25511 Pain in right shoulder: Secondary | ICD-10-CM

## 2013-10-30 DIAGNOSIS — E119 Type 2 diabetes mellitus without complications: Secondary | ICD-10-CM | POA: Insufficient documentation

## 2013-10-30 DIAGNOSIS — S46909A Unspecified injury of unspecified muscle, fascia and tendon at shoulder and upper arm level, unspecified arm, initial encounter: Secondary | ICD-10-CM | POA: Insufficient documentation

## 2013-10-30 DIAGNOSIS — R5381 Other malaise: Secondary | ICD-10-CM | POA: Insufficient documentation

## 2013-10-30 DIAGNOSIS — R531 Weakness: Secondary | ICD-10-CM

## 2013-10-30 LAB — COMPREHENSIVE METABOLIC PANEL
ALT: 39 U/L — ABNORMAL HIGH (ref 0–35)
Alkaline Phosphatase: 89 U/L (ref 39–117)
BUN: 13 mg/dL (ref 6–23)
CO2: 25 mEq/L (ref 19–32)
Chloride: 100 mEq/L (ref 96–112)
GFR calc Af Amer: >90 mL/min (ref >90)
GFR calc non Af Amer: >90 mL/min (ref >90)
Glucose, Bld: 97 mg/dL (ref 70–99)
Potassium: 4 mEq/L (ref 3.5–5.1)
Sodium: 136 mEq/L (ref 135–145)
Total Bilirubin: 0.3 mg/dL (ref 0.3–1.2)
Total Protein: 7.4 g/dL (ref 6.0–8.3)

## 2013-10-30 LAB — CBC WITH DIFFERENTIAL/PLATELET
Basophils Absolute: 0 10*3/uL (ref 0.0–0.1)
Basophils Relative: 0 % (ref 0–1)
HCT: 38 % (ref 36.0–46.0)
MCHC: 33.9 g/dL (ref 30.0–36.0)
Monocytes Absolute: 0.8 10*3/uL (ref 0.1–1.0)
Neutro Abs: 3.6 10*3/uL (ref 1.7–7.7)
Platelets: 337 10*3/uL (ref 150–400)
RDW: 12.9 % (ref 11.5–15.5)
WBC: 7.1 10*3/uL (ref 4.0–10.5)

## 2013-10-30 LAB — URINALYSIS, ROUTINE W REFLEX MICROSCOPIC
Bilirubin Urine: NEGATIVE
Glucose, UA: NEGATIVE mg/dL
Specific Gravity, Urine: >1.03 — ABNORMAL HIGH (ref 1.005–1.030)
pH: 6 (ref 5.0–8.0)

## 2013-10-30 LAB — POCT PREGNANCY, URINE: Preg Test, Ur: NEGATIVE

## 2013-10-30 LAB — URINE MICROSCOPIC-ADD ON

## 2013-10-30 MED ORDER — ACETAMINOPHEN 325 MG PO TABS
650.0000 mg | ORAL_TABLET | Freq: Once | ORAL | Status: AC
  Administered 2013-10-30: 650 mg via ORAL
  Filled 2013-10-30: qty 2

## 2013-10-30 NOTE — ED Provider Notes (Addendum)
CSN: [id number]     Arrival date & time 10/30/13  1737 History   First MD Initiated Contact with Patient 10/30/13 1756     Chief Complaint  Patient presents with  . Weakness  . Arm Pain   (Consider location/radiation/quality/duration/timing/severity/associated sxs/prior Treatment) HPI Patient presents with concern of weakness, and pain in her shoulder and elbow following a recent fall. Patient states that she is generally well.  She is currently breast-feeding. She states over the past month she has had persistent generalized weakness, without focal pain.  There's been no syncope, no vomiting, no diarrhea, no fevers, no chills. Yesterday, after a period of similar weakness she fell, striking her right elbow, right shoulder.  Since that time the pain has been focally in these areas.  The pain is sore, worse with motion. She has not taken any medication for relief thus far. No trauma, no head or neck pain.  Past Medical History  Diagnosis Date  . Miscarriage   . Diabetes mellitus   . Hypertension     no meds patient denies   Past Surgical History  Procedure Laterality Date  . Cesarean section    . Cesarean section  08/17/2012    Procedure: CESAREAN SECTION;  Surgeon: Lesly Dukes, MD;  Location: [facility];  Service: Obstetrics;  Laterality: N/A;   Family History  Problem Relation Age of Onset  . Hypertension Mother   . Kidney disease Father   . Multiple sclerosis Father    History  Substance Use Topics  . Smoking status: Never Smoker   . Smokeless tobacco: Never Used  . Alcohol Use: Yes     Comment: socially   OB History   Grav Para Term Preterm Abortions TAB SAB Ect Mult Living   3 2 2  1  1   2      Review of Systems  Constitutional:       Per HPI, otherwise negative  HENT:       Per HPI, otherwise negative  Respiratory:       Per HPI, otherwise negative  Cardiovascular:       Per HPI, otherwise negative  Gastrointestinal: Negative for vomiting.  Endocrine:      Negative aside from HPI  Genitourinary:       Neg aside from HPI   Musculoskeletal:       Per HPI, otherwise negative  Skin: Negative.   Neurological: Negative for syncope.    Allergies  Review of patient's allergies indicates no known allergies.  Home Medications  No current outpatient prescriptions on file. BP 125/50  Pulse 91  Temp(Src) 98.5 F (36.9 C) (Oral)  Resp 16  SpO2 98% Physical Exam  Nursing note and vitals reviewed. Constitutional: She is oriented to person, place, and time. She appears well-developed and well-nourished. No distress.  HENT:  Head: Normocephalic and atraumatic.  Eyes: Conjunctivae and EOM are normal.  Cardiovascular: Normal rate and regular rhythm.   Pulmonary/Chest: Effort normal and breath sounds normal. No stridor. No respiratory distress.  Abdominal: She exhibits no distension.  Musculoskeletal:       Right shoulder: She exhibits tenderness and bony tenderness. She exhibits normal range of motion, no swelling, no effusion, no crepitus, no deformity, no laceration, no spasm, normal pulse and normal strength.       Right elbow: She exhibits normal range of motion, no swelling, no effusion, no deformity and no laceration. No tenderness found.       Arms: Neurological: She is alert  and oriented to person, place, and time. No cranial nerve deficit. She exhibits normal muscle tone. Coordination normal.  Symmetric 5/5 strength in all extremities  Skin: Skin is warm and dry.  Psychiatric: She has a normal mood and affect.    ED Course  Procedures (including critical care time) Labs Review Labs Reviewed - No data to display Imaging Review No results found.  EKG Interpretation   None      Sinus rhythm, rate 99, unremarkable  10:06 PM On repeat exam the patient appears comfortable, no new complaints.  Discussed all results with her and her husband.  With the patient's description of ongoing weakness, she'll followup with the primary care  physician.  In addition with her shoulder pain, occasional knee discomfort she will follow up with orthopedics the MDM   1. Weakness   2. Shoulder pain, acute, right    This generally well-appearing female presents after a fall yesterday with concern of generalized weakness as well as pain in her shoulder.  Patient is neurovascularly intact, hemodynamically stable, in no distress.  Throughout the patient's emergency department course she had no new complaints, remained in no distress, and had her reassuring laboratory evaluation. Urine culture was sent, though the patient denied any urinary complaints. Patient was discharged with resources to obtain a primary care physician if she does not have one, as well as to followup with orthopedics for her shoulder pain if it persists in spite of the initiated medication regimen.    Gerhard Munch, MD 10/30/13 1914  Gerhard Munch, MD 10/31/13 Marlyne Beards

## 2013-10-30 NOTE — ED Notes (Signed)
Pt reports for the past 2 weeks she has been feeling weak and fell yesterday at home. Also reports arm pain from where she fell yesterday. Pt also reports some dizziness and nausea. Denies any chest pain or SOB.

## 2013-10-30 NOTE — ED Notes (Signed)
Pt complains of dizziness and overall weakness for the last month. Pt complained of right knee "giving out" three times this past week and falling. Pt fell yesterday and caught herself with her right arm. She complains of pain on the right side. Pt denies nausea and vomiting. Complains of some dizziness in the past, but none presently.

## 2013-11-01 LAB — URINE CULTURE: Colony Count: >=100000

## 2014-04-24 ENCOUNTER — Emergency Department (HOSPITAL_COMMUNITY): Payer: Medicaid Other

## 2014-04-24 ENCOUNTER — Emergency Department (HOSPITAL_COMMUNITY)
Admission: EM | Admit: 2014-04-24 | Discharge: 2014-04-25 | Disposition: A | Discharge status: Home / Self Care | Payer: Medicaid Other | Attending: Emergency Medicine | Admitting: Emergency Medicine

## 2014-04-24 ENCOUNTER — Encounter (HOSPITAL_COMMUNITY): Payer: Self-pay | Admitting: Emergency Medicine

## 2014-04-24 DIAGNOSIS — E119 Type 2 diabetes mellitus without complications: Secondary | ICD-10-CM | POA: Insufficient documentation

## 2014-04-24 DIAGNOSIS — O210 Mild hyperemesis gravidarum: Secondary | ICD-10-CM | POA: Insufficient documentation

## 2014-04-24 DIAGNOSIS — O24919 Unspecified diabetes mellitus in pregnancy, unspecified trimester: Secondary | ICD-10-CM | POA: Insufficient documentation

## 2014-04-24 DIAGNOSIS — R300 Dysuria: Secondary | ICD-10-CM | POA: Insufficient documentation

## 2014-04-24 DIAGNOSIS — N949 Unspecified condition associated with female genital organs and menstrual cycle: Secondary | ICD-10-CM | POA: Insufficient documentation

## 2014-04-24 DIAGNOSIS — R109 Unspecified abdominal pain: Secondary | ICD-10-CM | POA: Insufficient documentation

## 2014-04-24 DIAGNOSIS — O9989 Other specified diseases and conditions complicating pregnancy, childbirth and the puerperium: Secondary | ICD-10-CM | POA: Insufficient documentation

## 2014-04-24 DIAGNOSIS — O169 Unspecified maternal hypertension, unspecified trimester: Secondary | ICD-10-CM | POA: Insufficient documentation

## 2014-04-24 DIAGNOSIS — Z3490 Encounter for supervision of normal pregnancy, unspecified, unspecified trimester: Secondary | ICD-10-CM

## 2014-04-24 LAB — CBC WITH DIFFERENTIAL/PLATELET
BASOS PCT: 0 % (ref 0–1)
Basophils Absolute: 0 10*3/uL (ref 0.0–0.1)
EOS ABS: 0.1 10*3/uL (ref 0.0–0.7)
EOS PCT: 1 % (ref 0–5)
HCT: 37.6 % (ref 36.0–46.0)
Hemoglobin: 12.4 g/dL (ref 12.0–15.0)
Lymphocytes Relative: 30 % (ref 12–46)
Lymphs Abs: 3.5 10*3/uL (ref 0.7–4.0)
MCH: 25.4 pg — AB (ref 26.0–34.0)
MCHC: 33 g/dL (ref 30.0–36.0)
MCV: 77 fL — AB (ref 78.0–100.0)
Monocytes Absolute: 1 10*3/uL (ref 0.1–1.0)
Monocytes Relative: 9 % (ref 3–12)
NEUTROS PCT: 60 % (ref 43–77)
Neutro Abs: 6.7 10*3/uL (ref 1.7–7.7)
PLATELETS: 401 10*3/uL — AB (ref 150–400)
RBC: 4.88 MIL/uL (ref 3.87–5.11)
RDW: 13.6 % (ref 11.5–15.5)
WBC: 11.3 10*3/uL — ABNORMAL HIGH (ref 4.0–10.5)

## 2014-04-24 LAB — BASIC METABOLIC PANEL
BUN: 15 mg/dL (ref 6–23)
CALCIUM: 9.4 mg/dL (ref 8.4–10.5)
CO2: 24 mEq/L (ref 19–32)
Chloride: 104 mEq/L (ref 96–112)
Creatinine, Ser: 0.62 mg/dL (ref 0.50–1.10)
Glucose, Bld: 109 mg/dL — ABNORMAL HIGH (ref 70–99)
POTASSIUM: 4.3 meq/L (ref 3.7–5.3)
SODIUM: 138 meq/L (ref 137–147)

## 2014-04-24 LAB — URINALYSIS, ROUTINE W REFLEX MICROSCOPIC
Bilirubin Urine: NEGATIVE
GLUCOSE, UA: NEGATIVE mg/dL
Hgb urine dipstick: NEGATIVE
KETONES UR: NEGATIVE mg/dL
Nitrite: NEGATIVE
PH: 5.5 (ref 5.0–8.0)
Protein, ur: NEGATIVE mg/dL
SPECIFIC GRAVITY, URINE: 1.028 (ref 1.005–1.030)
Urobilinogen, UA: 1 mg/dL (ref 0.0–1.0)

## 2014-04-24 LAB — URINE MICROSCOPIC-ADD ON

## 2014-04-24 LAB — HCG, QUANTITATIVE, PREGNANCY: hCG, Beta Chain, Quant, S: 27576 m[IU]/mL — ABNORMAL HIGH (ref <5)

## 2014-04-24 LAB — PREGNANCY, URINE: PREG TEST UR: POSITIVE — AB

## 2014-04-24 MED ORDER — PRENATAL COMPLETE 14-0.4 MG PO TABS: 1.0000 | ORAL_TABLET | Freq: Two times a day (BID) | ORAL | Status: DC

## 2014-04-24 NOTE — ED Provider Notes (Signed)
CSN: [id number]     Arrival date & time 04/24/14  2005 History   First MD Initiated Contact with Patient 04/24/14 2108     Chief Complaint  Patient presents with  . Abdominal Pain  . Dysuria     (Consider location/radiation/quality/duration/timing/severity/associated sxs/prior Treatment) HPI  Patient presents to the ER with complaints of abdominal cramping, morning sickness and wondering if she is pregnant. She reports having 1 positive and 1 negative pregnancy test at home. She is still currently breast feeding her 35 year old son. She has not had any severe pain or vaginal bleeding.  She is G 2-1. She also reports being a diabetic and not having medications. She is unsure if she takes insulin or pills. She thinks when she was pregnant she took different medications then when she is not pregnant. Her sugars have been reportedly running in the 170's at home. She requests a medication refill.   Past Medical History  Diagnosis Date  . Miscarriage   . Diabetes mellitus   . Hypertension     no meds patient denies   Past Surgical History  Procedure Laterality Date  . Cesarean section    . Cesarean section  08/17/2012    Procedure: CESAREAN SECTION;  Surgeon: Lesly Dukes, MD;  Location: [facility];  Service: Obstetrics;  Laterality: N/A;   Family History  Problem Relation Age of Onset  . Hypertension Mother   . Kidney disease Father   . Multiple sclerosis Father    History  Substance Use Topics  . Smoking status: Never Smoker   . Smokeless tobacco: Never Used  . Alcohol Use: Yes     Comment: socially   OB History   Grav Para Term Preterm Abortions TAB SAB Ect Mult Living   3 2 2  1  1   2      Review of Systems   Review of Systems  Gen: no weight loss, fevers, chills, night sweats  Eyes: no discharge or drainage, no occular pain or visual changes  Nose: no epistaxis or rhinorrhea  Mouth: no dental pain, no sore throat  Neck: no neck pain  Lungs:No wheezing, coughing or  hemoptysis CV: no chest pain, palpitations, dependent edema or orthopnea  Abd: no abdominal pain, nausea, vomiting, diarrhea GU: no dysuria or gross hematuria + pelvic cramping MSK:  No muscle weakness or pain Neuro: no headache, no focal neurologic deficits  Skin: no rash or wounds Psyche: no complaints    Allergies  Review of patient's allergies indicates no known allergies.  Home Medications   Prior to Admission medications   Not on File   BP 133/65  Pulse 84  Temp(Src) 98.8 F (37.1 C) (Oral)  Resp 19  Ht 5\' 3"  (1.6 m)  Wt 230 lb (104.327 kg)  BMI 40.75 kg/m2  SpO2 99%  Breastfeeding? 33 Yes Physical Exam  Nursing note and vitals reviewed. Constitutional: She appears well-developed and well-nourished. No distress.  HENT:  Head: Normocephalic and atraumatic.  Eyes: Pupils are equal, round, and reactive to light.  Neck: Normal range of motion. Neck supple.  Cardiovascular: Normal rate and regular rhythm.   Pulmonary/Chest: Effort normal.  Abdominal: Soft. Bowel sounds are normal. She exhibits no distension. There is no tenderness. There is no guarding.  Neurological: She is alert.  Skin: Skin is warm and dry.    ED Course  Procedures (including critical care time) Labs Review Labs Reviewed  URINALYSIS, ROUTINE W REFLEX MICROSCOPIC - Abnormal; Notable for the following:  APPearance CLOUDY (*)    Leukocytes, UA SMALL (*)    All other components within normal limits  PREGNANCY, URINE - Abnormal; Notable for the following:    Preg Test, Ur POSITIVE (*)    All other components within normal limits  URINE MICROSCOPIC-ADD ON - Abnormal; Notable for the following:    Squamous Epithelial / LPF MANY (*)    Bacteria, UA FEW (*)    All other components within normal limits  CBC WITH DIFFERENTIAL - Abnormal; Notable for the following:    WBC 11.3 (*)    MCV 77.0 (*)    MCH 25.4 (*)    Platelets 401 (*)    All other components within normal limits  BASIC METABOLIC  PANEL - Abnormal; Notable for the following:    Glucose, Bld 109 (*)    All other components within normal limits  HCG, QUANTITATIVE, PREGNANCY - Abnormal; Notable for the following:    hCG, Beta Chain, Sharene ButtersQuant, S [id number] (*)    All other components within normal limits    Imaging Review Koreas Ob Transvaginal  04/24/2014   CLINICAL DATA:  Abdominal pain and dysuria  EXAM: OBSTETRIC <14 WK US AND TRANSVAGINAL OB US  TECHNIQUE: Both transabdominal and transvaginal ultrasound examinations were performed for complete evaluation of the gestation as well as the maternal uterus, adnexal regions, and pelvic cul-de-sac. Transvaginal technique was performed to assess early pregnancy.  COMPARISON:  08/07/2012  FINDINGS: Intrauterine gestational sac: Visualized.  Yolk sac:  Present  Embryo:  Present  Cardiac Activity: Present  Heart Rate:  119 bpm  CRL:   4.3  mm   6 w 1 d                  US EDC: 12/17/2014  Maternal uterus/adnexae: No subchorionic hemorrhage. 2.4 cm follicular cyst in the left ovary. The right ovary was not visualized. No free pelvic fluid.  IMPRESSION: 1. Single, living intrauterine gestation. Sonographic age 35 weeks 1 day. 2. Right ovary not visualized.   Electronically Signed   By: Tiburcio PeaJonathan  Watts M.D.   On: 04/24/2014 23:11   [place] Ob Transvaginal  04/24/2014   CLINICAL DATA:  Abdominal pain and dysuria  EXAM: OBSTETRIC <14 WK US AND TRANSVAGINAL OB US  TECHNIQUE: Both transabdominal and transvaginal ultrasound examinations were performed for complete evaluation of the gestation as well as the maternal uterus, adnexal regions, and pelvic cul-de-sac. Transvaginal technique was performed to assess early pregnancy.  COMPARISON:  08/07/2012  FINDINGS: Intrauterine gestational sac: Visualized.  Yolk sac:  Present  Embryo:  Present  Cardiac Activity: Present  Heart Rate:  119 bpm  CRL:   4.3  mm   6 w 1 d                  US EDC: 12/17/2014  Maternal uterus/adnexae: No subchorionic hemorrhage. 2.4 cm  follicular cyst in the left ovary. The right ovary was not visualized. No free pelvic fluid.  IMPRESSION: 1. Single, living intrauterine gestation. Sonographic age 35 weeks 1 day. 2. Right ovary not visualized.   Electronically Signed   By: Tiburcio PeaJonathan  Watts M.D.   On: 04/24/2014 23:11     EKG Interpretation None      MDM   Final diagnoses:  Pregnant    Pt has positive pregnancy test in the ED. She declines pelvic, says she will have to se e her OB who will do one and doesn't want to have to do it twice.  HCG quant and transvag US ordered to confirm IUP.   Her ultrasound confirms 6 week IUP, hcg still pending. Patient will be given referral to [facility].  33 y.o.Al SerranoMaldonad's evaluation in the Emergency Department is complete. It has been determined that no acute conditions requiring further emergency intervention are present at this time. The patient/guardian have been advised of the diagnosis and plan. We have discussed signs and symptoms that warrant return to the ED, such as changes or worsening in symptoms.  Vital signs are stable at discharge. Filed Vitals:   04/24/14 2015  BP: 133/65  Pulse: 84  Temp: 98.8 F (37.1 C)  Resp: 19    Patient/guardian has voiced understanding and agreed to follow-up with the PCP or specialist.     Dorthula Matas, PA-C 04/24/14 2328

## 2014-04-24 NOTE — Discharge Instructions (Signed)
cido flico en el embarazo (Folic Acid in Pregnancy) El cido flico es una vitamina B que ayuda a prevenir defectos del tubo neural. El tubo neural es la parte de un feto en desarrollo que se convierte en el cerebro y la mdula espinal. Cuando el tubo neural no se cierra como corresponde, el beb nace con un defecto del tubo neural. Los defectos del tubo neural incluyen espina bfida, hernia de la mdula espinal y la ausencia de [place]a parte o de todo el cerebro (anencefalia).  Tome cido flico al menos 4semanas antes de quedar embarazada y Energy Transfer Partnersdurante los primeros 3meses de Beltramiembarazo, que es cuando se desarrolla el tubo neural. El cido flico est disponible en la mayora de las multivitaminas o como suplemento con cido flico solamente, y tambin lo contienen algunos alimentos. El consumo de la cantidad [place]val[place] & Jan May[place] Islandsadecuada de cido flico antes de la concepcin y durante el embarazo disminuye las probabilidades de Warehouse managertener un beb con un defecto del tubo neural. El hecho de tomar cido flico no influir en un defecto del tubo neural en el caso de que ya se haya desarrollado. DIAGNSTICO   Si el feto tiene un defecto del tubo neural, un anlisis de alfa fetoprotena (AFP) en la sangre o de lquido amnitico mostrar niveles altos de alfa fetoprotena. Este anlisis se hace a todas las Tax inspectorembarazadas en el primer trimestre.  Una ecografa puede detectar un defecto del tubo neural. LO QUE USTED PUEDE HACER:  Tome una multivitamina con al Lowe's Companiesmenos 0,674miligramos (400microgramos) de cido flico CarMaxtodos los das, al menos 4semanas antes de quedar embarazada y Lisbondurante las primeras 12semanas de Psychiatristembarazo.  Si ya ha tenido un beb con un defecto del tubo neural, tome 4miligramos (4000microgramos) de cido flico CarMaxtodos los das. Tome esta cantidad desde 1mes antes de intentar quedar Moldovaembarazada y [address] tomndola durante los primeros 3meses de Marcellineembarazo. Si tiene convulsiones o toma medicamentos para controlarlas, infrmeselo a  su obstetra. Siga tomando el cido flico, excepto si le indican lo contrario.  CIDO FLICO EN LOS ALIMENTOS Siga una dieta saludable con alimentos que contengan cido flico, la forma natural de la vitamina. Algunos de estos alimentos son:  Cereales fortificados para el desayuno.  Lentejas.  Esprragos.  Espinaca.  Vsceras (hgado).  Frijoles negros.  [place]acahuates (solo si no es Counselling psychologistalrgica).  Brcoli.  Aura CampsFresas, naranjas.  Jugo de naranja (es mejor el jugo concentrado).  Pastas y panes enriquecidos.  Cathleen FearsLechuga romana. CONSULTE A SU MDICO EN LOS SIGUIENTES CASOS:  Est en el primer trimestre de embarazo y tiene niveles altos de azcar en la sangre.  Est en el primer trimestre de embarazo y tiene fiebre alta. En casi todos los casos, un feto en el que se detecta un defecto del tubo neural necesitar atencin especializada, que puede no estar disponible en todos los hospitales. Hable con su mdico sobre lo que es mejor para usted y su beb. Document Released: 09/01/2013 [facility] Patient Information 2014 [place]ExitCare, [place]LLC.  Medicamentos durante el embarazo  (Medicines During Pregnancy) Durante el embarazo, hay medicamentos que son seguros y otros no lo son. Entre los United Parcelmedicamentos se Baxter Internationalincluyen los recetados, los de Burbankventa libre, las cremas tpicas que se aplican en la piel y todos los medicamentos de hierbas. Los medicamentos se clasifican en clase A, B, C, o D. Los medicamentos de clase A y B han demostrado ser seguros en el Psychiatristembarazo. Los medicamentos de clase C tambin se consideran seguros durante el Sunriseembarazo, pero slo deben usarse cuando son necesarios. Los United Parcelmedicamentos  clase D no deben utilizarse en absoluto en el embarazo. Pueden ser dainos para un beb.  Lo mejor es Occupational hygienist cantidad posible de medicamentos durante el [place]. Sin embargo, algunos son necesarios para cuidar la salud del beb y Chief Strategy Officer. A veces, es ms peligroso dejar de tomar ciertos medicamentos que  continuar usndolos. Este es el caso de las personas que sufren enfermedades de larga duracin (crnicas) como el asma, la diabetes o la presin arterial alta (hipertensin). Si est embarazada y sufre una enfermedad crnica, llame a su mdico de inmediato. Lleve una lista de los medicamentos que [place] y sus dosis a las visitas mdicas. Si usted est planeando quedar embarazada, programe una cita con el mdico e infrmele acerca de los medicamentos que toma.  Por ltimo, anote el nmero de telfono de Film/video editor. Podr responder preguntas sobre la clase a que pertenece un medicamento y su seguridad. No podr darle asesoramiento en cuanto a si debe o no debe tomar un medicamento.  MEDICAMENTOS SEGUROS Y NO SEGUROS  Hay una larga lista de medicamentos que se consideran seguros para Engineer, site. A continuacin se [place]. Para determinados medicamentos, consulte con su mdico.  Medicamentos para laalergia La loratadina, cetirizina y clorfeniramina son medicamentos seguros. Algunos aerosoles nasales que contienen corticoides son seguros. Consulte a su mdico acerca de las marcas especficas que son seguras.  Analgsicos El paracetamol y el paracetamol con codena son seguros. Todos los otros medicamentos anti-inflamatorios no esteroideos (AINE) no son seguros. Aqu se incluye el ibuprofeno.  Anticidos  Muchos anticidos de venta libre son seguros para tomar. Consulte a su mdico acerca de las marcas especficas que son seguras. La famotidina, ranitidina, lansoprazol estn permitidos. El omeprazol se considera seguro para tomar en el segundo trimestre.  Antibiticos  Hay varios antibiticos que Energy manager. Estos incluyen tetraciclina, quinolonas y sulfas, pero puede haber otros. Consulte a su mdico antes de tomar cualquier antibitico.  Antihistamnicos  Consulte a su mdico acerca de las marcas especficas que son seguras.  Medicamentos para el asma    La mayora de los inhaladores con corticoides para el asma son seguros. Hable con su mdico para obtener informacin especfica.  Calcio  Los suplementos de calcio son seguros. No consuma calcio de ostras.  Medicamentos para la tos y el resfro  Es seguro que utilice productos que contienen guaifenesina o dextrometorfano. Consulte a su mdico acerca de las marcas especficas que son seguras. No es seguro tomar productos que contengan aspirina o ibuprofeno.  Medicamentos descongestivos Los productos que contienen pseudoefedrina son seguros para tomar en el segundo y Systems analyst trimestre.  Antidepresivos  Consulte a su mdico acerca de estos medicamentos.  Antidiarreicos  Es seguro tomar loperamida. Consulte a su mdico acerca de las marcas especficas que son seguras. No es seguro tomar medicamentos antidiarreicos que contengan bismuto.  Gotas oftlmicas  Las gotas oftlmicas para la alergia deben limitarse.  Hierro  Es seguro usar en el embarazo ciertos medicamentos que contienen hierro para la anemia. Ellos requieren Auto-Owners [employer].  Medicamentos para las nauseas  Es seguro tomar doxilamina y vitamina B6 segn las indicaciones. Si fuera necesario, existen otros medicamentos con receta disponibles.  Pldoras para dormir  Es seguro tomar difenhidramina y paracetamol con difenhidramina.  Corticoides  Las cremas con hidrocortisona son seguras si se usan como se indica. Los corticoides por va oral requieren prescripcin mdica. No es Dietitian crema para las hemorroides que contengan pramoxina o fenilefrina.  Laxantes  Es seguro tomar laxantes. Evite su uso diario o prolongado.  Medicamentos para la tiroides  Es importante que siga usando el medicamento para la tiroides. Necesita ser controlada por su mdico.  Medicamentos para uso vaginal  El mdico le recetar un medicamento si usted tiene una infeccin vaginal. Ciertos medicamentos antifngicos son seguros si sufre  una infeccin de transmisin sexual (ETS). Consulte a su mdico.  Document Released: 11/11/2005 Document Revised: 07/14/2013 [facility] Patient Information 2014 [place], [place].

## 2014-04-24 NOTE — ED Provider Notes (Signed)
Medical screening examination/treatment/procedure(s) were conducted as a shared visit with non-physician practitioner(s) and myself.  I personally evaluated the patient during the encounter.   EKG Interpretation None      Pt c/o mild dull suprapubic area pain, and questions whether pregnant. No vaginal discharge or bleeding. No fevers. abd soft nt. Labs/urine/upreg.   Suzi Roots, MD 04/24/14 [phone]

## 2014-04-24 NOTE — ED Notes (Signed)
Pt arrived to the ED with a complaint of abdominal pain.  Pt took a pregnancy test 8 days ago and it was positive.  Pt has a 56 month old she is still breast feeding.  Pt has no previous medical history.  Pt is not having emesis in the am.  Pt is feeling slightly dizzy in the am

## 2014-05-23 ENCOUNTER — Ambulatory Visit (INDEPENDENT_AMBULATORY_CARE_PROVIDER_SITE_OTHER): Payer: Medicaid Other | Admitting: Family Medicine

## 2014-05-23 ENCOUNTER — Encounter: Payer: Self-pay | Admitting: Family Medicine

## 2014-05-23 ENCOUNTER — Other Ambulatory Visit: Payer: Self-pay | Admitting: Family Medicine

## 2014-05-23 VITALS — BP 128/76 | HR 80 | Temp 98.4°F | Wt 238.8 lb

## 2014-05-23 DIAGNOSIS — O24919 Unspecified diabetes mellitus in pregnancy, unspecified trimester: Secondary | ICD-10-CM

## 2014-05-23 DIAGNOSIS — O09529 Supervision of elderly multigravida, unspecified trimester: Secondary | ICD-10-CM | POA: Insufficient documentation

## 2014-05-23 DIAGNOSIS — O09522 Supervision of elderly multigravida, second trimester: Secondary | ICD-10-CM

## 2014-05-23 DIAGNOSIS — R030 Elevated blood-pressure reading, without diagnosis of hypertension: Secondary | ICD-10-CM

## 2014-05-23 DIAGNOSIS — O24911 Unspecified diabetes mellitus in pregnancy, first trimester: Secondary | ICD-10-CM | POA: Insufficient documentation

## 2014-05-23 DIAGNOSIS — Z9889 Other specified postprocedural states: Secondary | ICD-10-CM

## 2014-05-23 DIAGNOSIS — Z98891 History of uterine scar from previous surgery: Secondary | ICD-10-CM

## 2014-05-23 DIAGNOSIS — O99810 Abnormal glucose complicating pregnancy: Secondary | ICD-10-CM

## 2014-05-23 DIAGNOSIS — E119 Type 2 diabetes mellitus without complications: Secondary | ICD-10-CM

## 2014-05-23 LAB — POCT URINALYSIS DIP (DEVICE)
Bilirubin Urine: NEGATIVE
GLUCOSE, UA: NEGATIVE mg/dL
Hgb urine dipstick: NEGATIVE
Ketones, ur: NEGATIVE mg/dL
Nitrite: NEGATIVE
Protein, ur: NEGATIVE mg/dL
SPECIFIC GRAVITY, URINE: 1.02 (ref 1.005–1.030)
UROBILINOGEN UA: 0.2 mg/dL (ref 0.0–1.0)
pH: 5.5 (ref 5.0–8.0)

## 2014-05-23 LAB — HEMOGLOBIN A1C
Hgb A1c MFr Bld: 6.2 % — ABNORMAL HIGH (ref <5.7)
MEAN PLASMA GLUCOSE: 131 mg/dL — AB (ref <117)

## 2014-05-23 NOTE — Progress Notes (Signed)
Nutrition note: 1st visit consult & DM diet education Pt has h/o obesity & had GDM in previous pregnancies- unsure if pt has type 2 DM. Pt has gained 15.8# @ 642w2d, which is > expected. Pt reports eating 3-4x/d. Pt is not taking a PNV yet. Pt reports no N/V or heartburn. Pt is walking ~3x/wk. Pt received verbal & written education in Spanish via interpreter, Raynelle FanningJulie about DM diet during pregnancy. Encouraged PNV daily. Discussed wt gain goals of 11-20# or 0.5#/wk. Pt agrees to follow DM diet with 3 meals & 1-3 snacks/d with proper CHO/ protein combination. Pt plans to start taking a PNV. Pt does not have WIC but plans to apply. Pt plans to BF. F/u in 4-6 wks Blondell RevealLaura Smith, MS, RD, LDN, [facility] HospitalBCLC

## 2014-05-23 NOTE — Progress Notes (Signed)
Here for initial visit. States not sure of first day of LMP , but was in March, ended in early April. C/o " a little dark brown vaginal discharge since  05/17/14. Given new patient information. Was told has diabetes during last pregnancy, but then afterwards said did lab work and told her everything normal.   Given True track meter , strip, lancets.  Patient states she remembers how to do cbg's.  Helped her set up meter and she gave me a demonstration of checking her cbg properly. Reviewed with her to do cbg;s before breakfast and 2 hours after meals..  Also instructed here to bring meter and logbook to all appointments.

## 2014-05-23 NOTE — Patient Instructions (Addendum)
Embarazo, Engineer, maintenancerimer trimestre (Pregnancy, First Trimester) El primer trimestre son los primeros tres meses en los que el beb crece dentro suyo. Es importante seguir las indicaciones del profesional que lo asiste. CUIDADOS EN EL HOGAR  No fumar.  No beba alcohol.  Tomar slo los medicamentos que el mdico le haya indicado.  Ejercicios.  Consuma una dieta saludable. Consuma alimentos balanceados.  Puede tener relaciones sexuales si no hay problemas con el embarazo.  Ayuda para los nuseas matinales:  Coma galletitas saladas antes de levantarse por la [place]maana.  Coma 4  5 comidas pequeas por da en vez de tres grandes.  Beba lquidos entre comidas, no durante ellas.  Concurra a la Training and development officerconsulta con el profesional que lo asiste de acuerdo a lo que le haya indicado.  A veces necesita vitaminas extra y hierro Academic librariandurante el embarazo. Pregunte al mdico si las necesita. SOLICITE AYUDA DE INMEDIATO SI:  La temperatura oral.  Percibe que tiene una prdida con olor ftido que proviene de la vagina.  Observa una hemorragia que proviene de la vagina.  Siente dolor intenso en el vientre o la espalda.  Vomita sangre. Vmitos que parecen borra de caf.  Pierde ms de 1 Kg en una semana.  Gana ms de 2 Kg en una semana.  Aumenta ms de 1 Kg en una semana Y nota los tobillos, los pies o las piernas hinchados.  Siente mareos o se desmaya.  Ha estado cerca de personas con rubola. , varicela, o la quinta enfermedad.  Presenta dolor de cabeza, diarrea, dolor al orinar o le falta la respiracin. Document Released: 02/07/2009 Document Revised: 02/03/2012 [facility] Patient Information 2015 [place]ExitCare, [place]LLC. This information is not intended to replace advice given to you by your health care provider. Make sure you discuss any questions you have with your health care provider.

## 2014-05-23 NOTE — Progress Notes (Signed)
Subjective:    Maria Daniel is a [id number] 6363w2d being seen today for her first obstetrical visit.  Her obstetrical history is significant for advanced maternal age and Diabetes and previous c/s x2. Pt states she does not have diabetes but in review of the chart she failed her 2 hour 6 weeks postpartum in 2013 so is likely a class B that has not been taking meds.  Patient does intend to breast feed. Pregnancy history fully reviewed.  Patient reports no complaints. Has had some spotting post intercourse but no vaginal bleeding.    Filed Vitals:   05/23/14 0922  BP: 128/76  Pulse: 80  Temp: 98.4 F (36.9 C)  Weight: 238 lb 12.8 oz (108.319 kg)    HISTORY: OB History  Gravida Para Term Preterm AB SAB TAB Ectopic Multiple Living  4 2 2  1 1    2     # Outcome Date GA Lbr Len/2nd Weight Sex Delivery Anes PTL Lv  4 CUR           3 TRM 08/17/12 1845w0d   M LTCS Other    2 SAB 11/2002          1 TRM 06/25/02 1383w0d 20:00 6 lb 8 oz (2.948 kg) M CS EPI  Y     Comments: arrest of dilation     Past Medical History  Diagnosis Date  . Miscarriage   . Diabetes mellitus   . Hypertension     no meds patient denies   Past Surgical History  Procedure Laterality Date  . Cesarean section    . Cesarean section  08/17/2012    Procedure: CESAREAN SECTION;  Surgeon: Lesly DukesKelly H Leggett, MD;  Location: [facility];  Service: Obstetrics;  Laterality: N/A;   Family History  Problem Relation Age of Onset  . Hypertension Mother   . Kidney disease Father   . Multiple sclerosis Father      Exam    Uterus:   appropriate for dates.    Pelvic Exam:    Perineum: No Hemorrhoids   Vulva: normal   Vagina:  normal mucosa, normal discharge   pH: normal   Cervix: normal, cervical ectropion.     Adnexa: normal adnexa and no mass, fullness, tenderness  System: Breast:  Deferred per pt request   Skin: normal coloration and turgor, no rashes    Neurologic: oriented   Extremities: normal strength, tone,  and muscle mass   HEENT extra ocular movement intact   Mouth/Teeth mucous membranes moist, pharynx normal without lesions   Neck supple   Cardiovascular: regular rate and rhythm, no murmurs or gallops   Respiratory:  appears well, vitals normal, no respiratory distress, acyanotic, normal RR, ear and throat exam is normal, neck free of mass or lymphadenopathy, chest clear, no wheezing, crepitations, rhonchi, normal symmetric air entry   Abdomen: soft, non-tender; bowel sounds normal; no masses,  no organomegaly   Urinary: urethral meatus normal      Assessment:    Pregnancy: [id number] Patient Active Problem List   Diagnosis Date Noted  . AMA (advanced maternal age) multigravida 35+ 05/23/2014  . Borderline hypertension 04/15/2012  . Class B Diabetes 04/14/2012  . History of C-section 04/14/2012        Plan:     Initial labs drawn. Prenatal vitamins. Problem list reviewed and updated. Class B DM- labs drawn, diabetes education and nutrition today, plan to start checking sugars immediately, will need fetal echo but too early to order. Ophthalmology  ordered also.   Genetic Screening discussed First Screen: requested.   Follow up in 2 weeks. 50% of 30 min visit spent on counseling and coordination of care.     BECK, KELI L 05/23/2014

## 2014-05-23 NOTE — Progress Notes (Signed)
First Trimester Screen scheduled 06/07/14 at 4 pm with MFM.

## 2014-05-24 ENCOUNTER — Telehealth: Payer: Self-pay | Admitting: *Deleted

## 2014-05-24 LAB — OBSTETRIC PANEL
ANTIBODY SCREEN: NEGATIVE
BASOS PCT: 0 % (ref 0–1)
Basophils Absolute: 0 10*3/uL (ref 0.0–0.1)
EOS ABS: 0.1 10*3/uL (ref 0.0–0.7)
Eosinophils Relative: 2 % (ref 0–5)
HEMATOCRIT: 37.1 % (ref 36.0–46.0)
HEMOGLOBIN: 12.6 g/dL (ref 12.0–15.0)
Hepatitis B Surface Ag: NEGATIVE
LYMPHS ABS: 2.1 10*3/uL (ref 0.7–4.0)
Lymphocytes Relative: 29 % (ref 12–46)
MCH: 25.9 pg — AB (ref 26.0–34.0)
MCHC: 34 g/dL (ref 30.0–36.0)
MCV: 76.3 fL — ABNORMAL LOW (ref 78.0–100.0)
MONO ABS: 0.4 10*3/uL (ref 0.1–1.0)
MONOS PCT: 6 % (ref 3–12)
Neutro Abs: 4.5 10*3/uL (ref 1.7–7.7)
Neutrophils Relative %: 63 % (ref 43–77)
Platelets: 369 10*3/uL (ref 150–400)
RBC: 4.86 MIL/uL (ref 3.87–5.11)
RDW: 14.2 % (ref 11.5–15.5)
RH TYPE: POSITIVE
Rubella: 11.7 Index — ABNORMAL HIGH (ref <0.90)
WBC: 7.1 10*3/uL (ref 4.0–10.5)

## 2014-05-24 LAB — TSH: TSH: <0.008 u[IU]/mL — ABNORMAL LOW (ref 0.350–4.500)

## 2014-05-24 LAB — COMPREHENSIVE METABOLIC PANEL
ALBUMIN: 3.7 g/dL (ref 3.5–5.2)
ALK PHOS: 93 U/L (ref 39–117)
ALT: 11 U/L (ref 0–35)
AST: 13 U/L (ref 0–37)
BUN: 11 mg/dL (ref 6–23)
CHLORIDE: 98 meq/L (ref 96–112)
CO2: 25 mEq/L (ref 19–32)
Calcium: 8.9 mg/dL (ref 8.4–10.5)
Creat: 0.44 mg/dL — ABNORMAL LOW (ref 0.50–1.10)
Glucose, Bld: 212 mg/dL — ABNORMAL HIGH (ref 70–99)
POTASSIUM: 4.3 meq/L (ref 3.5–5.3)
Sodium: 133 mEq/L — ABNORMAL LOW (ref 135–145)
Total Bilirubin: 0.2 mg/dL (ref 0.2–1.2)
Total Protein: 6.8 g/dL (ref 6.0–8.3)

## 2014-05-24 LAB — GLUCOSE TOLERANCE, 1 HOUR (50G) W/O FASTING: Glucose, 1 Hour GTT: 193 mg/dL — ABNORMAL HIGH (ref 70–140)

## 2014-05-24 LAB — HIV ANTIBODY (ROUTINE TESTING W REFLEX): HIV: NONREACTIVE

## 2014-05-24 NOTE — Telephone Encounter (Signed)
Message copied by Mannie StabileASH, AMANDA A on Tue May 24, 2014 11:58 AM ------      Message from: Vale HavenBECK, KELI L      Created: Tue May 24, 2014  9:32 AM       Pt needs a 3 hour and also needs to have a free t4 and t3 drawn. Can you get this scheduled? Thanks! ------

## 2014-05-24 NOTE — Telephone Encounter (Signed)
Pt will have t4 and t3 drawn on Monday.

## 2014-05-25 LAB — CYTOLOGY - PAP

## 2014-05-25 LAB — PRESCRIPTION MONITORING PROFILE (19 PANEL)
AMPHETAMINE/METH: NEGATIVE ng/mL
BUPRENORPHINE, URINE: NEGATIVE ng/mL
Barbiturate Screen, Urine: NEGATIVE ng/mL
Benzodiazepine Screen, Urine: NEGATIVE ng/mL
CARISOPRODOL, URINE: NEGATIVE ng/mL
Cannabinoid Scrn, Ur: NEGATIVE ng/mL
Cocaine Metabolites: NEGATIVE ng/mL
Creatinine, Urine: 153.12 mg/dL (ref >20.0)
Fentanyl, Ur: NEGATIVE ng/mL
MDMA URINE: NEGATIVE ng/mL
METHADONE SCREEN, URINE: NEGATIVE ng/mL
METHAQUALONE SCREEN (URINE): NEGATIVE ng/mL
Meperidine, Ur: NEGATIVE ng/mL
NITRITES URINE, INITIAL: NEGATIVE ug/mL
OXYCODONE SCRN UR: NEGATIVE ng/mL
Opiate Screen, Urine: NEGATIVE ng/mL
PROPOXYPHENE: NEGATIVE ng/mL
Phencyclidine, Ur: NEGATIVE ng/mL
TAPENTADOLUR: NEGATIVE ng/mL
TRAMADOL UR: NEGATIVE ng/mL
Zolpidem, Urine: NEGATIVE ng/mL
pH, Initial: 5.6 pH (ref 4.5–8.9)

## 2014-05-25 LAB — CULTURE, OB URINE
Colony Count: NO GROWTH
ORGANISM ID, BACTERIA: NO GROWTH

## 2014-05-26 ENCOUNTER — Other Ambulatory Visit: Payer: Self-pay | Admitting: Family Medicine

## 2014-05-26 DIAGNOSIS — Z3682 Encounter for antenatal screening for nuchal translucency: Secondary | ICD-10-CM

## 2014-05-26 LAB — CREATININE CLEARANCE, URINE, 24 HOUR
CREATININE, URINE: 44.8 mg/dL
Creatinine Clearance: 177 mL/min — ABNORMAL HIGH (ref 75–115)
Creatinine, 24H Ur: 1120 mg/d (ref 700–1800)
Creatinine: 0.44 mg/dL — ABNORMAL LOW (ref 0.50–1.10)

## 2014-05-26 LAB — PROTEIN, URINE, 24 HOUR: Protein, Urine: <3 mg/dL

## 2014-05-30 ENCOUNTER — Ambulatory Visit (INDEPENDENT_AMBULATORY_CARE_PROVIDER_SITE_OTHER): Payer: Self-pay | Admitting: Family Medicine

## 2014-05-30 VITALS — BP 136/84 | HR 80 | Temp 98.1°F | Wt 241.3 lb

## 2014-05-30 DIAGNOSIS — O99281 Endocrine, nutritional and metabolic diseases complicating pregnancy, first trimester: Principal | ICD-10-CM

## 2014-05-30 DIAGNOSIS — O99280 Endocrine, nutritional and metabolic diseases complicating pregnancy, unspecified trimester: Secondary | ICD-10-CM

## 2014-05-30 DIAGNOSIS — E079 Disorder of thyroid, unspecified: Secondary | ICD-10-CM

## 2014-05-30 DIAGNOSIS — O24919 Unspecified diabetes mellitus in pregnancy, unspecified trimester: Secondary | ICD-10-CM

## 2014-05-30 DIAGNOSIS — O24911 Unspecified diabetes mellitus in pregnancy, first trimester: Secondary | ICD-10-CM

## 2014-05-30 DIAGNOSIS — E119 Type 2 diabetes mellitus without complications: Secondary | ICD-10-CM

## 2014-05-30 DIAGNOSIS — E0590 Thyrotoxicosis, unspecified without thyrotoxic crisis or storm: Secondary | ICD-10-CM

## 2014-05-30 LAB — POCT URINALYSIS DIP (DEVICE)
BILIRUBIN URINE: NEGATIVE
GLUCOSE, UA: NEGATIVE mg/dL
KETONES UR: NEGATIVE mg/dL
NITRITE: NEGATIVE
PH: 5.5 (ref 5.0–8.0)
Protein, ur: NEGATIVE mg/dL
Specific Gravity, Urine: 1.02 (ref 1.005–1.030)
Urobilinogen, UA: 0.2 mg/dL (ref 0.0–1.0)

## 2014-05-30 MED ORDER — GLYBURIDE 5 MG PO TABS: 5.0000 mg | ORAL_TABLET | Freq: Two times a day (BID) | ORAL | Status: DC

## 2014-05-30 NOTE — Patient Instructions (Signed)

## 2014-05-30 NOTE — Progress Notes (Signed)
[facility] printed[facility] [address]

## 2014-05-30 NOTE — Addendum Note (Signed)
Addended by: Louanna RawAMPBELL, Yasemin Rabon M on: 05/30/2014 12:45 PM   Modules accepted: Orders, Medications

## 2014-05-30 NOTE — Addendum Note (Signed)
Addended by: Sherre LainASH, AMANDA A on: 05/30/2014 12:50 PM   Modules accepted: Orders

## 2014-05-30 NOTE — Progress Notes (Signed)
F 115/112/100/120/112/105/112 B162/175/148/150/160/140/164 L 170/174/168/140/171/190 D 172/172/180/169/160/159  Sugars all above goal. Pt reports exercising reports improved diet. Will start glyburide 5mg /2.5mg  Needs Fetal echo, optho  TSH very low - check t3, t4. Depending on result may require endo referral. Currently asymptomatic. signficant weight gain, no palpitations or other complaints.  Reports FM, no LOF, no VB, no ctx  Birdena Jubileerma SerranoMaldonad is a 33 y.o. [id number] at 3171w2d by L=6 here for ROB visit.  Discussed with Patient:  - New OB labs from last visit were reviewed - RTC for any VB, regular, painful cramps/ctxs occurring at a rate of >2/10 min, fever (100.5 or higher), n/v/d, any pain that is unresolving or worsening. - will check first trimester screen in MFM next week. - Routine precautions(SAB, depression, infection s/s) - RTC in 1 weeks for next appt.  To Do: 1.   [  ] BCM: OCP  Edu: [x ] PTL precautions; [ ]  BF class; [ ]  childbirth class; [ ]   BF counseling;

## 2014-05-31 LAB — T3, FREE: T3 FREE: 3.3 pg/mL (ref 2.3–4.2)

## 2014-05-31 LAB — T4, FREE: FREE T4: 1.41 ng/dL (ref 0.80–1.80)

## 2014-06-06 ENCOUNTER — Emergency Department (HOSPITAL_COMMUNITY): Payer: Self-pay

## 2014-06-06 ENCOUNTER — Emergency Department (HOSPITAL_COMMUNITY)
Admission: EM | Admit: 2014-06-06 | Discharge: 2014-06-07 | Disposition: A | Discharge status: Home / Self Care | Payer: Self-pay | Attending: Emergency Medicine | Admitting: Emergency Medicine

## 2014-06-06 ENCOUNTER — Encounter (HOSPITAL_COMMUNITY): Payer: Self-pay | Admitting: Emergency Medicine

## 2014-06-06 DIAGNOSIS — O169 Unspecified maternal hypertension, unspecified trimester: Secondary | ICD-10-CM | POA: Insufficient documentation

## 2014-06-06 DIAGNOSIS — O034 Incomplete spontaneous abortion without complication: Secondary | ICD-10-CM | POA: Insufficient documentation

## 2014-06-06 DIAGNOSIS — Z79899 Other long term (current) drug therapy: Secondary | ICD-10-CM | POA: Insufficient documentation

## 2014-06-06 DIAGNOSIS — O24919 Unspecified diabetes mellitus in pregnancy, unspecified trimester: Secondary | ICD-10-CM | POA: Insufficient documentation

## 2014-06-06 LAB — WET PREP, GENITAL
CLUE CELLS WET PREP: NONE SEEN
Trich, Wet Prep: NONE SEEN
YEAST WET PREP: NONE SEEN

## 2014-06-06 LAB — CBC WITH DIFFERENTIAL/PLATELET
BASOS ABS: 0 10*3/uL (ref 0.0–0.1)
Basophils Relative: 0 % (ref 0–1)
EOS ABS: 0.3 10*3/uL (ref 0.0–0.7)
Eosinophils Relative: 2 % (ref 0–5)
HEMATOCRIT: 41.5 % (ref 36.0–46.0)
Hemoglobin: 13.9 g/dL (ref 12.0–15.0)
LYMPHS PCT: 37 % (ref 12–46)
Lymphs Abs: 4.8 10*3/uL — ABNORMAL HIGH (ref 0.7–4.0)
MCH: 26 pg (ref 26.0–34.0)
MCHC: 33.5 g/dL (ref 30.0–36.0)
MCV: 77.7 fL — ABNORMAL LOW (ref 78.0–100.0)
MONOS PCT: 8 % (ref 3–12)
Monocytes Absolute: 1 10*3/uL (ref 0.1–1.0)
NEUTROS ABS: 7 10*3/uL (ref 1.7–7.7)
NEUTROS PCT: 53 % (ref 43–77)
PLATELETS: 410 10*3/uL — AB (ref 150–400)
RBC: 5.34 MIL/uL — ABNORMAL HIGH (ref 3.87–5.11)
RDW: 12.9 % (ref 11.5–15.5)
WBC: 13.1 10*3/uL — AB (ref 4.0–10.5)

## 2014-06-06 LAB — HEPATIC FUNCTION PANEL
ALBUMIN: 3.7 g/dL (ref 3.5–5.2)
ALT: 15 U/L (ref 0–35)
AST: 23 U/L (ref 0–37)
Alkaline Phosphatase: 115 U/L (ref 39–117)
Total Bilirubin: 0.2 mg/dL — ABNORMAL LOW (ref 0.3–1.2)
Total Protein: 8.3 g/dL (ref 6.0–8.3)

## 2014-06-06 LAB — URINALYSIS, ROUTINE W REFLEX MICROSCOPIC
Bilirubin Urine: NEGATIVE
GLUCOSE, UA: NEGATIVE mg/dL
KETONES UR: NEGATIVE mg/dL
Leukocytes, UA: NEGATIVE
NITRITE: NEGATIVE
Protein, ur: NEGATIVE mg/dL
Specific Gravity, Urine: 1.017 (ref 1.005–1.030)
UROBILINOGEN UA: 1 mg/dL (ref 0.0–1.0)
pH: 5.5 (ref 5.0–8.0)

## 2014-06-06 LAB — BASIC METABOLIC PANEL
ANION GAP: 14 (ref 5–15)
BUN: 10 mg/dL (ref 6–23)
CHLORIDE: 97 meq/L (ref 96–112)
CO2: 23 mEq/L (ref 19–32)
Calcium: 9.9 mg/dL (ref 8.4–10.5)
Creatinine, Ser: 0.47 mg/dL — ABNORMAL LOW (ref 0.50–1.10)
GFR calc non Af Amer: >90 mL/min (ref >90)
Glucose, Bld: 73 mg/dL (ref 70–99)
POTASSIUM: 4.2 meq/L (ref 3.7–5.3)
Sodium: 134 mEq/L — ABNORMAL LOW (ref 137–147)

## 2014-06-06 LAB — URINE MICROSCOPIC-ADD ON

## 2014-06-06 MED ORDER — ACETAMINOPHEN 325 MG PO TABS
975.0000 mg | ORAL_TABLET | Freq: Once | ORAL | Status: AC
  Administered 2014-06-06: 975 mg via ORAL
  Filled 2014-06-06: qty 3

## 2014-06-06 NOTE — ED Notes (Signed)
Patient does not speak [place] PA and this nurse at bedside with language line interpreter

## 2014-06-06 NOTE — ED Notes (Signed)
Bedside US completed Awaiting results for dispo Patient denies needs or complaints at this time Side rails up, call bell in reach and husband at bedside

## 2014-06-06 NOTE — ED Notes (Signed)
[facility] Patient with c/o bright red bleeding with clots and abdominal pain that started today Patient did not call OB/[facility] Clinic for these complaints

## 2014-06-06 NOTE — ED Notes (Signed)
Patient up to bathroom for urine specimen

## 2014-06-06 NOTE — ED Notes (Signed)
Pt complains of bright red vaginal bleeding with clots for two days, pt has [facility]. Pt complains of lower back and lower abd cramps

## 2014-06-06 NOTE — ED Provider Notes (Signed)
CSN: [id number]     Arrival date & time 06/06/14  2022 History   First MD Initiated Contact with Patient 06/06/14 2123     Chief Complaint  Patient presents with  . Vaginal Bleeding     (Consider location/radiation/quality/duration/timing/severity/associated sxs/prior Treatment) HPI  Brynli Ollis is a 33 y.o. female G3P2  3 months pregnant   with gestational diabetes, followed by [facility] hospital clinic complaining of bright red vaginal bleeding onset 2 days ago with clots starting today. Patient states that she was seen for a dark vaginal discharge several weeks ago was told to watch out for bleeding. The patient has not contacted her OB/GYN. She endorses a moderate to severe lower abdominal pain radiating to the left hip. No pain medications prior to arrival. Patient denies any trauma, fever chills, nausea vomiting, chest pain, shortness of breath, dysuria, hematuria  Chart review shows patient with a type and screen is O+ one year ago  Past Medical History  Diagnosis Date  . Miscarriage   . Diabetes mellitus   . Hypertension     no meds patient denies   Past Surgical History  Procedure Laterality Date  . Cesarean section    . Cesarean section  08/17/2012    Procedure: CESAREAN SECTION;  Surgeon: Lesly Dukes, MD;  Location: [facility];  Service: Obstetrics;  Laterality: N/A;   Family History  Problem Relation Age of Onset  . Hypertension Mother   . Kidney disease Father   . Multiple sclerosis Father    History  Substance Use Topics  . Smoking status: Never Smoker   . Smokeless tobacco: Never Used  . Alcohol Use: Yes     Comment: socially   OB History   Grav Para Term Preterm Abortions TAB SAB Ect Mult Living   4 2 2  1  1   2      Review of Systems  10 systems reviewed and found to be negative, except as noted in the HPI.   Allergies  Review of patient's allergies indicates no known allergies.  Home Medications   Prior to Admission medications    Medication Sig Start Date End Date Taking? Authorizing Provider  glyBURIDE (DIABETA) 5 MG tablet Take 1 tablet (5 mg total) by mouth 2 (two) times daily with a meal. Tome 1 tableta en la manana antes de desayuna, tome 1/2 tableta en la tarde antes de cena 05/30/14 05/30/15 Yes Minta Balsam, MD  Prenatal Vit-Fe Fumarate-FA (PRENATAL COMPLETE) 14-0.4 MG TABS Take 1 tablet by mouth 2 (two) times daily. 04/24/14  Yes Tiffany Irine Seal, PA-C   BP 129/67  Pulse 69  Temp(Src) 98.6 F (37 C) (Oral)  Resp 20  SpO2 97% Physical Exam  Nursing note and vitals reviewed. Constitutional: She is oriented to person, place, and time. She appears well-developed and well-nourished. No distress.  Obese  HENT:  Head: Normocephalic and atraumatic.  Mouth/Throat: Oropharynx is clear and moist.  Eyes: Conjunctivae and EOM are normal. Pupils are equal, round, and reactive to light.  Cardiovascular: Normal rate, regular rhythm and intact distal pulses.   Pulmonary/Chest: Effort normal and breath sounds normal. No stridor. No respiratory distress. She has no wheezes. She has no rales. She exhibits no tenderness.  Abdominal: Soft. Bowel sounds are normal. She exhibits no distension and no mass. There is no tenderness. There is no rebound and no guarding.  Genitourinary:  Pelvic examination chaperoned by technician: No rashes or lesions.   Tissue coming out of the os.  There is dark blood pooled in the posterior fourchette a period   Musculoskeletal: Normal range of motion. She exhibits no edema.  Neurological: She is alert and oriented to person, place, and time.  Psychiatric: She has a normal mood and affect.    ED Course  Procedures (including critical care time) Labs Review Labs Reviewed  WET PREP, GENITAL - Abnormal; Notable for the following:    WBC, Wet Prep HPF POC FEW (*)    All other components within normal limits  URINALYSIS, ROUTINE W REFLEX MICROSCOPIC - Abnormal; Notable for the following:     Hgb urine dipstick LARGE (*)    All other components within normal limits  CBC WITH DIFFERENTIAL - Abnormal; Notable for the following:    WBC 13.1 (*)    RBC 5.34 (*)    MCV 77.7 (*)    Platelets 410 (*)    Lymphs Abs 4.8 (*)    All other components within normal limits  BASIC METABOLIC PANEL - Abnormal; Notable for the following:    Sodium 134 (*)    Creatinine, Ser 0.47 (*)    All other components within normal limits  HEPATIC FUNCTION PANEL - Abnormal; Notable for the following:    Total Bilirubin 0.2 (*)    All other components within normal limits  GC/CHLAMYDIA PROBE AMP  URINE MICROSCOPIC-ADD ON    Imaging Review No results found.   EKG Interpretation None      MDM   Final diagnoses:  None    Filed Vitals:   06/06/14 2059 06/07/14 0120  BP: 141/82 129/67  Pulse: 78 69  Temp: 98.2 F (36.8 C) 98.6 F (37 C)  TempSrc: Oral Oral  Resp: 18 20  SpO2: 100% 97%    Medications  acetaminophen (TYLENOL) tablet 975 mg (975 mg Oral Given 06/06/14 2210)    Birdena Jubileerma SerranoMaldonad is a 33 y.o. female presenting with abdominal pain and 2 days of vaginal bleeding. Patient states she is passing clots. On chart review patient is found to be O+ she is not a broken hand candidate. Urinalysis shows no signs of infection.  Case signed out to PA Parker at shift change, plan is to follow up ultrasound.     Wynetta Emeryicole Mohammedali Bedoy, PA-C 06/07/14 0136

## 2014-06-06 NOTE — ED Notes (Signed)
Pt is 3 months pregnant and has an ultrasound appt tomorrow

## 2014-06-07 ENCOUNTER — Inpatient Hospital Stay (HOSPITAL_COMMUNITY): Payer: Self-pay

## 2014-06-07 ENCOUNTER — Ambulatory Visit (HOSPITAL_COMMUNITY): Admission: RE | Admit: 2014-06-07 | Payer: Self-pay | Source: Ambulatory Visit

## 2014-06-07 ENCOUNTER — Inpatient Hospital Stay (HOSPITAL_COMMUNITY)
Admission: AD | Admit: 2014-06-07 | Discharge: 2014-06-08 | Disposition: A | Discharge status: Home / Self Care | Payer: Self-pay | Source: Ambulatory Visit | Attending: Obstetrics and Gynecology | Admitting: Obstetrics and Gynecology

## 2014-06-07 ENCOUNTER — Telehealth: Payer: Self-pay | Admitting: *Deleted

## 2014-06-07 ENCOUNTER — Other Ambulatory Visit: Payer: Self-pay | Admitting: Obstetrics & Gynecology

## 2014-06-07 ENCOUNTER — Ambulatory Visit (HOSPITAL_COMMUNITY): Payer: Self-pay

## 2014-06-07 DIAGNOSIS — O24919 Unspecified diabetes mellitus in pregnancy, unspecified trimester: Secondary | ICD-10-CM | POA: Insufficient documentation

## 2014-06-07 DIAGNOSIS — R109 Unspecified abdominal pain: Secondary | ICD-10-CM | POA: Insufficient documentation

## 2014-06-07 DIAGNOSIS — E119 Type 2 diabetes mellitus without complications: Secondary | ICD-10-CM | POA: Insufficient documentation

## 2014-06-07 DIAGNOSIS — O034 Incomplete spontaneous abortion without complication: Secondary | ICD-10-CM | POA: Insufficient documentation

## 2014-06-07 DIAGNOSIS — O10019 Pre-existing essential hypertension complicating pregnancy, unspecified trimester: Secondary | ICD-10-CM | POA: Insufficient documentation

## 2014-06-07 DIAGNOSIS — O039 Complete or unspecified spontaneous abortion without complication: Secondary | ICD-10-CM

## 2014-06-07 LAB — CBC WITH DIFFERENTIAL/PLATELET
Basophils Absolute: 0 10*3/uL (ref 0.0–0.1)
Basophils Relative: 0 % (ref 0–1)
Eosinophils Absolute: 0.2 10*3/uL (ref 0.0–0.7)
Eosinophils Relative: 1 % (ref 0–5)
HEMATOCRIT: 35.5 % — AB (ref 36.0–46.0)
HEMOGLOBIN: 11.8 g/dL — AB (ref 12.0–15.0)
LYMPHS ABS: 3.2 10*3/uL (ref 0.7–4.0)
LYMPHS PCT: 22 % (ref 12–46)
MCH: 26.2 pg (ref 26.0–34.0)
MCHC: 33.2 g/dL (ref 30.0–36.0)
MCV: 78.7 fL (ref 78.0–100.0)
MONO ABS: 0.9 10*3/uL (ref 0.1–1.0)
MONOS PCT: 6 % (ref 3–12)
Neutro Abs: 10.2 10*3/uL — ABNORMAL HIGH (ref 1.7–7.7)
Neutrophils Relative %: 71 % (ref 43–77)
Platelets: 336 10*3/uL (ref 150–400)
RBC: 4.51 MIL/uL (ref 3.87–5.11)
RDW: 13.2 % (ref 11.5–15.5)
WBC: 14.5 10*3/uL — AB (ref 4.0–10.5)

## 2014-06-07 LAB — GC/CHLAMYDIA PROBE AMP
CT Probe RNA: NEGATIVE
GC PROBE AMP APTIMA: NEGATIVE

## 2014-06-07 MED ORDER — HYDROMORPHONE HCL PF 1 MG/ML IJ SOLN
1.0000 mg | Freq: Once | INTRAMUSCULAR | Status: AC
  Administered 2014-06-08: 1 mg via INTRAVENOUS
  Filled 2014-06-07: qty 1

## 2014-06-07 MED ORDER — MISOPROSTOL 200 MCG PO TABS: 800.0000 ug | ORAL_TABLET | Freq: Once | ORAL | Status: DC

## 2014-06-07 MED ORDER — HYDROMORPHONE HCL PF 1 MG/ML IJ SOLN
1.0000 mg | Freq: Once | INTRAMUSCULAR | Status: AC
  Administered 2014-06-07: 1 mg via INTRAMUSCULAR
  Filled 2014-06-07: qty 1

## 2014-06-07 MED ORDER — PROMETHAZINE HCL 25 MG PO TABS: 25.0000 mg | ORAL_TABLET | Freq: Four times a day (QID) | ORAL | Status: DC | PRN

## 2014-06-07 MED ORDER — IBUPROFEN 800 MG PO TABS: 800.0000 mg | ORAL_TABLET | Freq: Three times a day (TID) | ORAL | Status: DC | PRN

## 2014-06-07 MED ORDER — OXYCODONE-ACETAMINOPHEN 5-325 MG PO TABS: 1.0000 | ORAL_TABLET | ORAL | Status: DC | PRN

## 2014-06-07 MED ORDER — LACTATED RINGERS IV SOLN
Freq: Once | INTRAVENOUS | Status: AC
Start: 2014-06-07 — End: 2014-06-08
  Administered 2014-06-07: 23:00:00 via INTRAVENOUS

## 2014-06-07 NOTE — Discharge Instructions (Signed)
Keep your Appointment tomorrow with women's clinic, call before you go. You should have more abdominal discomfort (cramping) and vaginal bleeding with clots and tissue. Return if Symptoms worsen.   Take medication as prescribed.  Do not operate heavy machinery while taking phenergan for nausea.

## 2014-06-07 NOTE — Telephone Encounter (Signed)
Message copied by Mannie StabileASH, Alsha Meland A on Tue Jun 07, 2014  9:55 AM ------      Message from: Willodean RosenthalHARRAWAY-SMITH, CAROLYN      Created: Tue Jun 07, 2014  9:50 AM       Pt with incomplete AB from [facility].  Rec cytotec 800 mcg per vagina with f/u ov in 3 weeks (any provider).            Thx,      clh-S   ------

## 2014-06-07 NOTE — MAU Note (Signed)
Ultrasound at the bedside

## 2014-06-07 NOTE — Discharge Instructions (Signed)
Aborto incompleto °(Incomplete Miscarriage) °Un aborto espontáneo es la pérdida repentina de un bebé en gestación (feto) antes de la semana 20 del embarazo. En un aborto espontáneo, partes del feto o la placenta (alumbramiento) permanecen en el cuerpo.  °El aborto espontáneo puede ser una experiencia que afecte emocionalmente a la persona. Hable con su médico si tiene preguntas sobre el aborto espontáneo, el proceso de duelo y los planes futuros de embarazo. °CAUSAS  °· Algunos problemas cromosómicos pueden hacer imposible que el bebé se desarrolle normalmente. Los problemas con los genes o cromosomas del bebé son, en la mayoría de los casos, el resultado de errores que se producen, al azar, cuando el embrión se divide y crece. Estos problemas no se heredan de los padres. °· Infección en el cuello del útero. °· Problemas hormonales. °· Problemas en el cuello del útero, como tener un útero incompetente. Esto ocurre cuando los tejidos no son lo suficientemente fuertes como para contener el embarazo. °· Problemas del útero, como un útero con forma anormal, los fibromas o anormalidades congénitas. °· Ciertas enfermedades crónicas. °· No fume, no beba alcohol, ni consuma drogas. °· Traumatismos. °SÍNTOMAS  °· Sangrado o manchado vaginal, con o sin cólicos o dolor. °· Dolor o cólicos en el abdomen o en la cintura. °· Eliminación de líquido, tejidos o coágulos grandes por la vagina. °DIAGNÓSTICO  °El médico le hará un examen físico. También le indicará una ecografía para confirmar el aborto. Es posible que se realicen análisis de sangre. °TRATAMIENTO  °· Generalmente se realiza un procedimiento de dilatación y curetaje (D y C). Durante el procedimiento de dilatación y curetaje, el cuello del útero se abre (dilata) y se retira todo resto de tejido fetal o placentario del útero. °· Si hay una infección, le recetarán antibióticos. Posiblemente le receten otros medicamentos para reducir (contraer) el tamaño del útero si hay  mucha hemorragia. °· Si su tipo de sangre es Rh negativo y el del bebé es Rh positivo, necesitará una inyección de inmunoglobulina Rho(D). Esta inyección protegerá a los futuros bebés de tener problemas de compatibilidad Rh en futuros embarazos. °· Probablemente le indiquen reposo. Esto significa que debe quedarse en cama y levantarse únicamente para ir al baño. °INSTRUCCIONES PARA EL CUIDADO EN EL HOGAR  °· Haga reposo según las indicaciones del médico. °· Limite las actividades según las indicaciones del médico. Es posible que se le permita retomar las actividades livianas si no se le realizó un curetaje, pero necesitará tratamiento adicional. °· Lleve un registro de la cantidad de toallas sanitarias que usa por día. Observe cuán impregnadas (saturadas) están. Registre esta información. °· No  use tampones. °· No se haga duchas vaginales ni tenga relaciones sexuales hasta que el médico la autorice. °· Asista a todas las citas de seguimiento para una nueva evaluación y para continuar el tratamiento. °· Sólo tome medicamentos de venta libre o recetados para calmar el dolor, el malestar o bajar la fiebre, según las indicaciones de su médico. °· Tome los antibióticos como le indicó el médico. Asegúrese de que finaliza la prescripción completa aunque se sienta mejor. °SOLICITE ATENCIÓN MÉDICA DE INMEDIATO SI:  °· Siente calambres intensos en el estómago, en la espalda o en el abdomen. °· Le sube la fiebre sin motivo (asegúrese de registrar las cifras). °· Elimina coágulos grandes o tejidos (consérvelos para que el médico los analice). °· La hemorragia aumenta. °· Se siente mareada, débil o tiene episodios de desmayo. °ASEGÚRESE DE QUE:  °· Comprende estas instrucciones. °·   Controlará su afección. °· Recibirá ayuda de inmediato si no mejora o si empeora. °Document Released: 11/11/2005 Document Revised: 09/01/2013 °ExitCare® Patient Information ©2015 [employer]. This information is not intended to replace advice given  to you by your health care provider. Make sure you discuss any questions you have with your health care provider. ° °

## 2014-06-07 NOTE — ED Provider Notes (Signed)
Pt care assumed from [place], PA-C at shift change.  Pt is currently 3 months pregnant, comes in for bleeding with tissue. Awaiting ultrasound results.  US shows fetal demise.  Discussed lab results, imaging results, and treatment plan with the patient and husband via interpretor phone.Discussed [place] results and treatment plan for over 20 minutes. Advised patient to followup at [facility] clinic as previously scheduled. If she experienced increasing bleeding, pain, and this can be expected,to report to [facility] hospital.  Return precautions given. Reports understanding and no other concerns at this time.  Patient is stable for discharge at this time.  Results for orders placed during the hospital encounter of 06/06/14  WET PREP, GENITAL      Result Value Ref Range   Yeast Wet Prep HPF POC NONE SEEN  NONE SEEN   Trich, Wet Prep NONE SEEN  NONE SEEN   Clue Cells Wet Prep HPF POC NONE SEEN  NONE SEEN   WBC, Wet Prep HPF POC FEW (*) NONE SEEN  URINALYSIS, ROUTINE W REFLEX MICROSCOPIC      Result Value Ref Range   Color, Urine YELLOW  YELLOW   APPearance CLEAR  CLEAR   Specific Gravity, Urine 1.017  1.005 - 1.030   pH 5.5  5.0 - 8.0   Glucose, UA NEGATIVE  NEGATIVE mg/dL   Hgb urine dipstick LARGE (*) NEGATIVE   Bilirubin Urine NEGATIVE  NEGATIVE   Ketones, ur NEGATIVE  NEGATIVE mg/dL   Protein, ur NEGATIVE  NEGATIVE mg/dL   Urobilinogen, UA 1.0  0.0 - 1.0 mg/dL   Nitrite NEGATIVE  NEGATIVE   Leukocytes, UA NEGATIVE  NEGATIVE  CBC WITH DIFFERENTIAL      Result Value Ref Range   WBC 13.1 (*) 4.0 - 10.5 K/uL   RBC 5.34 (*) 3.87 - 5.11 MIL/uL   Hemoglobin 13.9  12.0 - 15.0 g/dL   HCT 16.1  09.6 - 04.5 %   MCV 77.7 (*) 78.0 - 100.0 fL   MCH 26.0  26.0 - 34.0 pg   MCHC 33.5  30.0 - 36.0 g/dL   RDW 40.9  81.1 - 91.4 %   Platelets 410 (*) 150 - 400 K/uL   Neutrophils Relative % 53  43 - 77 %   Lymphocytes Relative 37  12 - 46 %   Monocytes Relative 8  3 - 12 %   Eosinophils Relative 2  0 - 5 %   Basophils Relative 0  0 - 1 %   Neutro Abs 7.0  1.7 - 7.7 K/uL   Lymphs Abs 4.8 (*) 0.7 - 4.0 K/uL   Monocytes Absolute 1.0  0.1 - 1.0 K/uL   Eosinophils Absolute 0.3  0.0 - 0.7 K/uL   Basophils Absolute 0.0  0.0 - 0.1 K/uL   Smear Review MORPHOLOGY UNREMARKABLE    BASIC METABOLIC PANEL      Result Value Ref Range   Sodium 134 (*) 137 - 147 mEq/L   Potassium 4.2  3.7 - 5.3 mEq/L   Chloride 97  96 - 112 mEq/L   CO2 23  19 - 32 mEq/L   Glucose, Bld 73  70 - 99 mg/dL   BUN 10  6 - 23 mg/dL   Creatinine, Ser 7.82 (*) 0.50 - 1.10 mg/dL   Calcium 9.9  8.4 - 95.6 mg/dL   GFR calc non Af Amer >90  >90 mL/min   GFR calc Af Amer >90  >90 mL/min   Anion gap 14  5 -  15  HEPATIC FUNCTION PANEL      Result Value Ref Range   Total Protein 8.3  6.0 - 8.3 g/dL   Albumin 3.7  3.5 - 5.2 g/dL   AST 23  0 - 37 U/L   ALT 15  0 - 35 U/L   Alkaline Phosphatase 115  39 - 117 U/L   Total Bilirubin 0.2 (*) 0.3 - 1.2 mg/dL   Bilirubin, Direct <1.6<0.2  0.0 - 0.3 mg/dL   Indirect Bilirubin NOT CALCULATED  0.3 - 0.9 mg/dL  URINE MICROSCOPIC-ADD ON      Result Value Ref Range   Squamous Epithelial / LPF RARE  RARE   RBC / HPF 21-50  <3 RBC/hpf   [place] Ob Limited  06/06/2014   CLINICAL DATA:  Pregnancy and vaginal bleeding  EXAM: LIMITED OBSTETRIC ULTRASOUND  FINDINGS: Single intrauterine gestation is again identified. The gestational sac is irregular in shape, and enlarged relative to the small fetus. There has been abnormally retarded growth of the fetus since the comparison study. The current crown-rump length is 1.4 cm. No cardiac activity identified. Abnormally enlarged yolk sac, 1.4 cm in diameter. There is no clear subchorionic hematoma, but fluid, likely hemorrhage, is present within the endocervical canal and lower uterine segment. The ovaries are symmetric and normal in appearance.  IMPRESSION: Findings meet definitive criteria for failed intrauterine pregnancy. This follows SRU consensus guidelines:  Diagnostic Criteria for Nonviable Pregnancy Early in the First Trimester. Macy Mis Engl J Med [id number];369:1443-51.   Electronically Signed   By: Tiburcio PeaJonathan  Watts M.D.   On: 06/06/2014 23:22    Meds given in ED:  Medications  acetaminophen (TYLENOL) tablet 975 mg (975 mg Oral Given 06/06/14 2210)    Discharge Medication List as of 06/07/2014  3:34 AM    START taking these medications   Details  ibuprofen (ADVIL,MOTRIN) 800 MG tablet Take 1 tablet (800 mg total) by mouth every 8 (eight) hours as needed., Starting 06/07/2014, Until Discontinued, Print    oxyCODONE-acetaminophen (PERCOCET/ROXICET) 5-325 MG per tablet Take 1 tablet by mouth every 4 (four) hours as needed for moderate pain or severe pain., Starting 06/07/2014, Until Discontinued, Print    promethazine (PHENERGAN) 25 MG tablet Take 1 tablet (25 mg total) by mouth every 6 (six) hours as needed for nausea or vomiting., Starting 06/07/2014, Until Discontinued, Print           Clabe SealLauren M Malarie Tappen, PA-C 06/07/14 [phone]

## 2014-06-07 NOTE — MAU Note (Signed)
Pt states that yesterday at 6pm she started bleeding. [facility] Had an [facility]. Told her that she had a miscarriage. Having lower abdominal and back pain that started yesterday but has gotten worse today. Took "pills" to help pass the baby and after taking them she noticed what she thinks was the baby in the bathroom.

## 2014-06-07 NOTE — Telephone Encounter (Signed)
Called patient and spoke with her about her results. She was instructed to take cytotec in the vagina and was given a followup appointment. Patient was also offered an appointment to come in to discuss her results which she declined due to transportation. Patient will be able to come to her visit on 8/3. She stated that she has no further questions.

## 2014-06-07 NOTE — MAU Provider Note (Signed)
History     CSN: [id number]  Arrival date and time: 06/07/14 2114   First Provider Initiated Contact with Patient 06/07/14 2156      No chief complaint on file.  HPI [name] is a 33 y.o. [id number] at [redacted]w[redacted]d by early [place] who presents to MAU today with complaint of vaginal bleeding and severe lower abdominal pain. The patient was seen at [facility] yesterday when the bleeding started and diagnosed with failed pregnancy. The patient was given Rx for Cytotec from [facility] today and placed the tablets vaginally around 1800 tonight. She states that the bleeding has become very heavy and she has had an increase in lower abdominal pain. She took Percocet without relief. She has used "lots of pads" today. She denies fever, but is feeling weak and dizzy. No LOC.   OB History   Grav Para Term Preterm Abortions TAB SAB Ect Mult Living   4 2 2  1  1   2       Past Medical History  Diagnosis Date  . Miscarriage   . Diabetes mellitus   . Hypertension     no meds patient denies    Past Surgical History  Procedure Laterality Date  . Cesarean section    . Cesarean section  08/17/2012    Procedure: CESAREAN SECTION;  Surgeon: Lesly Dukes, MD;  Location: [facility];  Service: Obstetrics;  Laterality: N/A;    Family History  Problem Relation Age of Onset  . Hypertension Mother   . Kidney disease Father   . Multiple sclerosis Father     History  Substance Use Topics  . Smoking status: Never Smoker   . Smokeless tobacco: Never Used  . Alcohol Use: Yes     Comment: socially    Allergies: No Known Allergies  Prescriptions prior to admission  Medication Sig Dispense Refill  . glyBURIDE (DIABETA) 5 MG tablet Take 1 tablet (5 mg total) by mouth 2 (two) times daily with a meal. Tome 1 tableta en la manana antes de desayuna, tome 1/2 tableta en la tarde antes de cena  90 tablet  6  . ibuprofen (ADVIL,MOTRIN) 800 MG tablet Take 1 tablet (800 mg total) by mouth every 8 (eight) hours as  needed.  30 tablet  0  . misoprostol (CYTOTEC) 200 MCG tablet Place 4 tablets (800 mcg total) vaginally once.  4 tablet  0  . oxyCODONE-acetaminophen (PERCOCET/ROXICET) 5-325 MG per tablet Take 1 tablet by mouth every 4 (four) hours as needed for moderate pain or severe pain.  10 tablet  0  . Prenatal Vit-Fe Fumarate-FA (PRENATAL MULTIVITAMIN) TABS tablet Take 1 tablet by mouth daily at 12 noon.      . promethazine (PHENERGAN) 25 MG tablet Take 1 tablet (25 mg total) by mouth every 6 (six) hours as needed for nausea or vomiting.  10 tablet  0    Review of Systems  Constitutional: Negative for fever and malaise/fatigue.  Gastrointestinal: Positive for nausea, vomiting and abdominal pain. Negative for diarrhea and constipation.  Genitourinary:       + vaginal bleeding  Neurological: Positive for dizziness and weakness. Negative for loss of consciousness.   Physical Exam   Blood pressure 112/76, pulse 107, temperature 99 F (37.2 C), temperature source Oral, resp. rate 20, height 5\' 1"  (1.549 m), weight 240 lb (108.863 kg), SpO2 99.00%, currently breastfeeding.  Physical Exam  Constitutional: She is oriented to person, place, and time. She appears well-developed and well-nourished. No distress.  Cardiovascular: Tachycardia present.   Respiratory: Effort normal.  GI: Soft. She exhibits no distension and no mass. There is tenderness (moderate tenderness to palpation of the lower abdomen). There is no rebound and no guarding.  Genitourinary: There is bleeding (large amount of blood with clots in the vagina) around the vagina. No vaginal discharge found.  Large tissue, probably POC removed from the vagina and sent to pathology. Bleeding controlled with 4 fox swabs afterwards  Neurological: She is alert and oriented to person, place, and time.  Skin: Skin is warm and dry. No erythema.  Psychiatric: She has a normal mood and affect.   Results for orders placed during the hospital encounter of  06/07/14 (from the past 24 hour(s))  CBC WITH DIFFERENTIAL     Status: Abnormal   Collection Time    06/07/14  9:57 PM      Result Value Ref Range   WBC 14.5 (*) 4.0 - 10.5 K/uL   RBC 4.51  3.87 - 5.11 MIL/uL   Hemoglobin 11.8 (*) 12.0 - 15.0 g/dL   HCT 16.135.5 (*) 09.636.0 - 04.546.0 %   MCV 78.7  78.0 - 100.0 fL   MCH 26.2  26.0 - 34.0 pg   MCHC 33.2  30.0 - 36.0 g/dL   RDW 40.913.2  81.111.5 - 91.415.5 %   Platelets 336  150 - 400 K/uL   Neutrophils Relative % 71  43 - 77 %   Neutro Abs 10.2 (*) 1.7 - 7.7 K/uL   Lymphocytes Relative 22  12 - 46 %   Lymphs Abs 3.2  0.7 - 4.0 K/uL   Monocytes Relative 6  3 - 12 %   Monocytes Absolute 0.9  0.1 - 1.0 K/uL   Eosinophils Relative 1  0 - 5 %   Eosinophils Absolute 0.2  0.0 - 0.7 K/uL   Basophils Relative 0  0 - 1 %   Basophils Absolute 0.0  0.0 - 0.1 K/uL   [place] Ob Limited  06/06/2014   CLINICAL DATA:  Pregnancy and vaginal bleeding  EXAM: LIMITED OBSTETRIC ULTRASOUND  FINDINGS: Single intrauterine gestation is again identified. The gestational sac is irregular in shape, and enlarged relative to the small fetus. There has been abnormally retarded growth of the fetus since the comparison study. The current crown-rump length is 1.4 cm. No cardiac activity identified. Abnormally enlarged yolk sac, 1.4 cm in diameter. There is no clear subchorionic hematoma, but fluid, likely hemorrhage, is present within the endocervical canal and lower uterine segment. The ovaries are symmetric and normal in appearance.  IMPRESSION: Findings meet definitive criteria for failed intrauterine pregnancy. This follows SRU consensus guidelines: Diagnostic Criteria for Nonviable Pregnancy Early in the First Trimester. Macy Mis Engl J Med [phone];369:1443-51.   Electronically Signed   By: Tiburcio PeaJonathan  Watts M.D.   On: 06/06/2014 23:22   [place] Ob Transvaginal  06/07/2014   CLINICAL DATA:  Vaginal bleeding.  Spontaneous abortion in progress  EXAM: TRANSVAGINAL OB ULTRASOUND  TECHNIQUE: Transvaginal ultrasound  was performed for complete evaluation of the gestation as well as the maternal uterus, adnexal regions, and pelvic cul-de-sac.  COMPARISON:  Pelvic ultrasound from yesterday  FINDINGS: The previously noted gestational sac has been evacuated. The endometrial cavity is thickened to 2.5 cm by heterogeneous but predominantly hyperechoic material. Along the posterior aspect of the endometrial cavity there is vascularized material. The cervix is open, it with endocervical clot or tissue.  The ovaries are symmetric and unremarkable in appearance. No adnexal mass. No  free pelvic fluid.  IMPRESSION: 1. Spontaneous abortion with open cervix. Blood clot and products of conception distend the endometrial cavity. 2. Interval evacuation of the gestational sac.   Electronically Signed   By: Tiburcio Pea M.D.   On: 06/07/2014 23:33     MAU Course  Procedures None  MDM CBC and [place] today 1 mg IM dilaudid given in MAU IV LR started after patient reports dizziness after exam Bleeding is controlled. Patient is hemodynamically stable 1 mg Dilaudid given prior to discharge Discussed patient and labs/US results with Dr. Jolayne Panther. Ok for discharge today.   Assessment and Plan  A: Incomplete AB  P: Discharge home Patient advised to take previously prescribed Percocet and Ibuprofen PRN pain Bleeding precautions discussed Patient will follow-up with [facility] in 2-3 weeks. They will call patient with an appointment.  Patient may return to MAU as needed or if her condition were to change or worsen  Freddi Starr, PA-C  06/08/2014, 12:05 AM

## 2014-06-08 NOTE — ED Provider Notes (Signed)
Medical screening examination/treatment/procedure(s) were performed by non-physician practitioner and as supervising physician I was immediately available for consultation/collaboration.   EKG Interpretation None        Billijo Dilling, MD 06/08/14 0602 

## 2014-06-09 NOTE — MAU Provider Note (Signed)
Attestation of Attending Supervision of Advanced Practitioner (CNM/NP): Evaluation and management procedures were performed by the Advanced Practitioner under my supervision and collaboration.  I have reviewed the Advanced Practitioner's note and chart, and I agree with the management and plan.  Remigio Mcmillon 06/09/2014 9:18 AM

## 2014-06-11 NOTE — ED Provider Notes (Signed)
Medical screening examination/treatment/procedure(s) were performed by non-physician practitioner and as supervising physician I was immediately available for consultation/collaboration.   EKG Interpretation None        Gibril Mastro M Ravleen Ries, MD 06/11/14 0121 

## 2014-06-13 ENCOUNTER — Encounter: Payer: Self-pay | Admitting: Obstetrics & Gynecology

## 2014-06-27 ENCOUNTER — Ambulatory Visit (INDEPENDENT_AMBULATORY_CARE_PROVIDER_SITE_OTHER): Payer: Self-pay | Admitting: Family Medicine

## 2014-06-27 ENCOUNTER — Encounter: Payer: Self-pay | Admitting: Family Medicine

## 2014-06-27 VITALS — BP 98/60 | HR 86 | Wt 240.8 lb

## 2014-06-27 DIAGNOSIS — O034 Incomplete spontaneous abortion without complication: Secondary | ICD-10-CM

## 2014-06-27 NOTE — Progress Notes (Signed)
   Subjective:    Patient ID: Maria Daniel, female    DOB: 12/16/1980, 33 y.o.   MRN: [id number]  HPI  [id number] s/p 15w SAB, s/p cytotec, seen in MAU for incomplete abortion 06/07/14.  Reports she is sad but adjusting well, denies cramping/pain/bleeding/fevers.  Overall feeling well.  Declines any contraception at this time.  Review of Systems  Constitutional: Negative for chills, diaphoresis and fatigue.  Respiratory: Negative for cough, choking and shortness of breath.   Gastrointestinal: Negative for nausea, vomiting, diarrhea, constipation and rectal pain.  Genitourinary: Negative for dysuria, vaginal bleeding, vaginal discharge, vaginal pain and dyspareunia.  Skin: Negative for color change, pallor and wound.  Neurological: Negative for light-headedness.  Psychiatric/Behavioral: The patient is not nervous/anxious and is not hyperactive.        Objective:   Physical Exam  Constitutional: She appears well-developed and well-nourished. No distress.  HENT:  Head: Normocephalic and atraumatic.  Eyes: Conjunctivae are normal. Pupils are equal, round, and reactive to light.  Neck: Normal range of motion. Neck supple. No thyromegaly present.  Cardiovascular: Normal rate.   Pulmonary/Chest: Effort normal. No respiratory distress.  Abdominal: Soft. She exhibits no distension. There is no tenderness.  Musculoskeletal: She exhibits no edema.  Skin: Skin is warm and dry. No rash noted. She is not diaphoretic. No erythema.  Psychiatric: She has a normal mood and affect. Her behavior is normal.          Assessment & Plan:   Beta HCG quant ==> will follow up accordingly Advised to condoms for at least next 2-3 months, voiced understanding, declines any other form of contraception Diabetes: advised to seek primary care physician, may continue with glyburide, diet, exercise, weight loss encouraged

## 2014-06-28 LAB — HCG, QUANTITATIVE, PREGNANCY: hCG, Beta Chain, Quant, S: <2 m[IU]/mL

## 2014-06-29 ENCOUNTER — Telehealth: Payer: Self-pay

## 2014-06-29 NOTE — Telephone Encounter (Signed)
Message copied by Louanna RawAMPBELL, Marino Rogerson M on Wed Jun 29, 2014  9:24 AM ------      Message from: Fredirick LatheACOSTA, KRISTY      Created: Wed Jun 29, 2014 12:20 AM       Please call pt, let her know that the blood test was normal and we will not need to repeat it.            (Neg beta hcg after incomplete abortion)             ----- Message -----         From: [facility]         Sent: 06/28/2014   3:04 AM           To: Perry MountKristy Rocio Acosta, MD                   ------

## 2014-06-29 NOTE — Telephone Encounter (Signed)
Called patient with pacific interpreter [phone]#[id number] and informed of normal blood test-- no need for follow up. Patient verbalized understanding and gratitude. No questions or concerns.

## 2014-07-04 ENCOUNTER — Encounter: Payer: Self-pay | Admitting: Obstetrics & Gynecology

## 2014-09-26 ENCOUNTER — Encounter: Payer: Self-pay | Admitting: Family Medicine

## 2014-11-14 ENCOUNTER — Emergency Department (HOSPITAL_COMMUNITY): Payer: Self-pay

## 2014-11-14 ENCOUNTER — Encounter (HOSPITAL_COMMUNITY): Payer: Self-pay | Admitting: Emergency Medicine

## 2014-11-14 ENCOUNTER — Emergency Department (HOSPITAL_COMMUNITY)
Admission: EM | Admit: 2014-11-14 | Discharge: 2014-11-14 | Disposition: A | Discharge status: Home / Self Care | Payer: Self-pay | Attending: Emergency Medicine | Admitting: Emergency Medicine

## 2014-11-14 DIAGNOSIS — Z3202 Encounter for pregnancy test, result negative: Secondary | ICD-10-CM | POA: Insufficient documentation

## 2014-11-14 DIAGNOSIS — K219 Gastro-esophageal reflux disease without esophagitis: Secondary | ICD-10-CM | POA: Insufficient documentation

## 2014-11-14 DIAGNOSIS — M25569 Pain in unspecified knee: Secondary | ICD-10-CM

## 2014-11-14 DIAGNOSIS — IMO0001 Reserved for inherently not codable concepts without codable children: Secondary | ICD-10-CM

## 2014-11-14 DIAGNOSIS — S8992XA Unspecified injury of left lower leg, initial encounter: Secondary | ICD-10-CM | POA: Insufficient documentation

## 2014-11-14 DIAGNOSIS — Z79899 Other long term (current) drug therapy: Secondary | ICD-10-CM | POA: Insufficient documentation

## 2014-11-14 DIAGNOSIS — R0789 Other chest pain: Secondary | ICD-10-CM | POA: Insufficient documentation

## 2014-11-14 DIAGNOSIS — E119 Type 2 diabetes mellitus without complications: Secondary | ICD-10-CM | POA: Insufficient documentation

## 2014-11-14 DIAGNOSIS — Y9289 Other specified places as the place of occurrence of the external cause: Secondary | ICD-10-CM | POA: Insufficient documentation

## 2014-11-14 DIAGNOSIS — I10 Essential (primary) hypertension: Secondary | ICD-10-CM | POA: Insufficient documentation

## 2014-11-14 DIAGNOSIS — S8991XA Unspecified injury of right lower leg, initial encounter: Secondary | ICD-10-CM | POA: Insufficient documentation

## 2014-11-14 DIAGNOSIS — Y998 Other external cause status: Secondary | ICD-10-CM | POA: Insufficient documentation

## 2014-11-14 DIAGNOSIS — Y9301 Activity, walking, marching and hiking: Secondary | ICD-10-CM | POA: Insufficient documentation

## 2014-11-14 DIAGNOSIS — W108XXA Fall (on) (from) other stairs and steps, initial encounter: Secondary | ICD-10-CM | POA: Insufficient documentation

## 2014-11-14 LAB — CBC WITH DIFFERENTIAL/PLATELET
Basophils Absolute: 0 10*3/uL (ref 0.0–0.1)
Basophils Relative: 0 % (ref 0–1)
EOS PCT: 1 % (ref 0–5)
Eosinophils Absolute: 0.1 10*3/uL (ref 0.0–0.7)
HCT: 38.4 % (ref 36.0–46.0)
HEMOGLOBIN: 11.9 g/dL — AB (ref 12.0–15.0)
LYMPHS ABS: 3.1 10*3/uL (ref 0.7–4.0)
LYMPHS PCT: 41 % (ref 12–46)
MCH: 23.8 pg — AB (ref 26.0–34.0)
MCHC: 31 g/dL (ref 30.0–36.0)
MCV: 77 fL — AB (ref 78.0–100.0)
Monocytes Absolute: 0.8 10*3/uL (ref 0.1–1.0)
Monocytes Relative: 10 % (ref 3–12)
NEUTROS PCT: 48 % (ref 43–77)
Neutro Abs: 3.6 10*3/uL (ref 1.7–7.7)
Platelets: 441 10*3/uL — ABNORMAL HIGH (ref 150–400)
RBC: 4.99 MIL/uL (ref 3.87–5.11)
RDW: 13.8 % (ref 11.5–15.5)
WBC: 7.7 10*3/uL (ref 4.0–10.5)

## 2014-11-14 LAB — COMPREHENSIVE METABOLIC PANEL
ALK PHOS: 88 U/L (ref 39–117)
ALT: 66 U/L — AB (ref 0–35)
ANION GAP: 18 — AB (ref 5–15)
AST: 56 U/L — ABNORMAL HIGH (ref 0–37)
Albumin: 3.5 g/dL (ref 3.5–5.2)
BUN: 11 mg/dL (ref 6–23)
CO2: 20 mEq/L (ref 19–32)
Calcium: 9.9 mg/dL (ref 8.4–10.5)
Chloride: 98 mEq/L (ref 96–112)
Creatinine, Ser: 0.33 mg/dL — ABNORMAL LOW (ref 0.50–1.10)
GFR calc Af Amer: >90 mL/min (ref >90)
GLUCOSE: 101 mg/dL — AB (ref 70–99)
POTASSIUM: 4.2 meq/L (ref 3.7–5.3)
SODIUM: 136 meq/L — AB (ref 137–147)
TOTAL PROTEIN: 8 g/dL (ref 6.0–8.3)
Total Bilirubin: 0.5 mg/dL (ref 0.3–1.2)

## 2014-11-14 LAB — URINALYSIS, ROUTINE W REFLEX MICROSCOPIC
BILIRUBIN URINE: NEGATIVE
Glucose, UA: NEGATIVE mg/dL
HGB URINE DIPSTICK: NEGATIVE
KETONES UR: NEGATIVE mg/dL
Leukocytes, UA: NEGATIVE
NITRITE: NEGATIVE
PROTEIN: NEGATIVE mg/dL
Specific Gravity, Urine: 1.026 (ref 1.005–1.030)
UROBILINOGEN UA: 2 mg/dL — AB (ref 0.0–1.0)
pH: 6 (ref 5.0–8.0)

## 2014-11-14 LAB — PREGNANCY, URINE: Preg Test, Ur: NEGATIVE

## 2014-11-14 MED ORDER — SODIUM CHLORIDE 0.9 % IV SOLN
INTRAVENOUS | Status: DC
  Filled 2014-11-14: qty 2.5

## 2014-11-14 MED ORDER — PANTOPRAZOLE SODIUM 40 MG PO TBEC
40.0000 mg | DELAYED_RELEASE_TABLET | Freq: Once | ORAL | Status: AC
  Administered 2014-11-14: 40 mg via ORAL
  Filled 2014-11-14: qty 1

## 2014-11-14 MED ORDER — PANTOPRAZOLE SODIUM 40 MG PO TBEC: 40.0000 mg | DELAYED_RELEASE_TABLET | Freq: Every day | ORAL | Status: DC

## 2014-11-14 MED ORDER — DEXTROSE-NACL 5-0.45 % IV SOLN: INTRAVENOUS | Status: DC

## 2014-11-14 NOTE — ED Provider Notes (Signed)
CSN: [id number]     Arrival date & time 11/14/14  1736 History   First MD Initiated Contact with Patient 11/14/14 1837     Chief Complaint  Patient presents with  . Knee Pain     (Consider location/radiation/quality/duration/timing/severity/associated sxs/prior Treatment) HPI Comments: Maria Daniel is a 33 y.o. female who presents to the Emergency Department complaining of bilateral knee pain that started 2 years ago. She reports difficulty with walking up stairs due to her weakness. States she fell this morning because her knees felt weak. Bearing weight worsens pain. Denies numbness, weakness. Pt is also complaining of abdominal pain that started one week ago. Palpation and laying on her side worsen the pain. States she only had to take insulin when she was pregnant but no longer has to. She has taken tylenol and left over oxycodone with some relief. Pt took a pregnancy test 3-4 days ago due to emesis over the last 20 days but states it was negative. Emesis is only after she eats but states she is eating and drinking normally. States she had chest pain with associated mild SOB last night that she describes as a pressure but states it is now resolved. Denies chest pain or SOB currently, vaginal discharge, vaginal bleeding, vaginal pain.   Patient is a 33 y.o. female presenting with knee pain. The history is provided by the patient. The history is limited by a language barrier. A language interpreter was used.  Knee Pain Location:  Knee Time since incident: 2 years. Injury: no   Knee location:  R knee and L knee Pain details:    Quality:  Aching   Onset quality:  Gradual   Duration: 2 years. Chronicity:  Chronic Dislocation: no   Relieved by: motrin, narcotics. Worsened by:  Bearing weight Associated symptoms: no back pain, no fever, no swelling and no tingling     Past Medical History  Diagnosis Date  . Miscarriage   . Diabetes mellitus   . Hypertension     no meds patient  denies   Past Surgical History  Procedure Laterality Date  . Cesarean section    . Cesarean section  08/17/2012    Procedure: CESAREAN SECTION;  Surgeon: Lesly DukesKelly H Leggett, MD;  Location: [facility];  Service: Obstetrics;  Laterality: N/A;   Family History  Problem Relation Age of Onset  . Hypertension Mother   . Kidney disease Father   . Multiple sclerosis Father    History  Substance Use Topics  . Smoking status: Never Smoker   . Smokeless tobacco: Never Used  . Alcohol Use: Yes     Comment: socially   OB History    Gravida Para Term Preterm AB TAB SAB Ectopic Multiple Living   4 2 2  1  1   2      Review of Systems  Constitutional: Negative for fever.  Respiratory: Negative for cough and shortness of breath.   Gastrointestinal: Positive for vomiting (for past 20 days, after eating).  Musculoskeletal: Negative for back pain.  All other systems reviewed and are negative.     Allergies  Review of patient's allergies indicates no known allergies.  Home Medications   Prior to Admission medications   Medication Sig Start Date End Date Taking? Authorizing Provider  glyBURIDE (DIABETA) 5 MG tablet Take 1 tablet (5 mg total) by mouth 2 (two) times daily with a meal. Tome 1 tableta en la manana antes de desayuna, tome 1/2 tableta en la tarde antes de cena  Patient not taking: Reported on 11/14/2014 05/30/14 05/30/15  Minta BalsamMichael R Odom, MD  ibuprofen (ADVIL,MOTRIN) 800 MG tablet Take 1 tablet (800 mg total) by mouth every 8 (eight) hours as needed. Patient not taking: Reported on 11/14/2014 06/07/14   Mellody DrownLauren Parker, PA-C  misoprostol (CYTOTEC) 200 MCG tablet Place 4 tablets (800 mcg total) vaginally once. Patient not taking: Reported on 11/14/2014 06/07/14   Willodean Rosenthalarolyn Harraway-Smith, MD  oxyCODONE-acetaminophen (PERCOCET/ROXICET) 5-325 MG per tablet Take 1 tablet by mouth every 4 (four) hours as needed for moderate pain or severe pain. Patient not taking: Reported on 11/14/2014 06/07/14   Mellody DrownLauren  Parker, PA-C  promethazine (PHENERGAN) 25 MG tablet Take 1 tablet (25 mg total) by mouth every 6 (six) hours as needed for nausea or vomiting. Patient not taking: Reported on 11/14/2014 06/07/14   Mellody DrownLauren Parker, PA-C   BP 114/62 mmHg  Pulse 93  Temp(Src) 98.2 F (36.8 C) (Oral)  Resp 18  SpO2 99%  LMP 10/25/2014 (Approximate) Physical Exam  Constitutional: She is oriented to person, place, and time. She appears well-developed and well-nourished. No distress.  HENT:  Head: Normocephalic and atraumatic.  Mouth/Throat: Oropharynx is clear and moist.  Eyes: EOM are normal. Pupils are equal, round, and reactive to light.  Neck: Normal range of motion. Neck supple.  Cardiovascular: Normal rate and regular rhythm.  Exam reveals no friction rub.   No murmur heard. Pulmonary/Chest: Effort normal and breath sounds normal. No respiratory distress. She has no wheezes. She has no rales.  Abdominal: Soft. She exhibits no distension. There is no tenderness. There is no rebound.  Musculoskeletal: Normal range of motion. She exhibits no edema.       Right knee: Tenderness (mild, diffuse) found.       Left knee: Tenderness (mild, diffuse) found.  Neurological: She is alert and oriented to person, place, and time.  Skin: She is not diaphoretic.  Nursing note and vitals reviewed.   ED Course  Procedures (including critical care time) Labs Review Labs Reviewed  CBC WITH DIFFERENTIAL - Abnormal; Notable for the following:    Hemoglobin 11.9 (*)    MCV 77.0 (*)    MCH 23.8 (*)    Platelets 441 (*)    All other components within normal limits  URINALYSIS, ROUTINE W REFLEX MICROSCOPIC - Abnormal; Notable for the following:    Color, Urine AMBER (*)    APPearance CLOUDY (*)    Urobilinogen, UA 2.0 (*)    All other components within normal limits  COMPREHENSIVE METABOLIC PANEL  PREGNANCY, URINE  I-STAT TROPOININ, ED    Imaging Review Dg Chest 2 View  11/14/2014   CLINICAL DATA:  Bilateral  knee pain with weakness. Abdominal pain. History of diabetes.  EXAM: CHEST  2 VIEW  COMPARISON:  None.  FINDINGS: The heart size and mediastinal contours are normal. The lungs are clear. There is no pleural effusion or pneumothorax. No acute osseous findings are identified.  IMPRESSION: No active cardiopulmonary process.   Electronically Signed   By: Roxy HorsemanBill  Veazey M.D.   On: 11/14/2014 20:34     EKG Interpretation   Date/Time:  Monday November 14 2014 19:45:31 EST Ventricular Rate:  102 PR Interval:  132 QRS Duration: 90 QT Interval:  358 QTC Calculation: 466 R Axis:   11 Text Interpretation:  Sinus tachycardia RSR' in V1 or V2, right VCD or RVH  Similar to prior Confirmed by [facility]  MD, Ksenia Kunz [phone] on 11/14/2014  10:11:47 PM  MDM   Final diagnoses:  Chest discomfort  Knee pain    67F here with 2 years of knee pain. Larey Seat today due to weakness. Has difficulty with stairs. Full ROM. With 2 years of chronic pain, likely due to being overweight, she is morbidly obese. Will xray here.  Also having lateral abdominal pain, feels like when she was taking insulin shots previously. On exam, no central abdominal pain. She does have pain when palpating her lateral pannus folds. No concern for acute abdominal process, no need for CT.  Is also vomiting after eating for past 20 days. On exam is belching, will start meds for reflux.  Will check basic labs and xray her knees.  Imaging and labs ok. Stable for discharge, given PPI for one month, given resource guide.  Elwin Mocha, MD 11/14/14 [phone]

## 2014-11-14 NOTE — Discharge Instructions (Signed)
Dolor msculoesqueltico (Musculoskeletal Pain) El dolor musculoesqueltico se siente en huesos y msculos. El dolor puede ocurrir en cualquier parte del cuerpo. El profesional que lo asiste podr tratarlo sin Geologist, engineeringconocer la causa del dolor. Lo tratar Time Warneraunque las pruebas de laboratorio (sangre y Comorosorina), las radiografas y otros estudios sean normales. La causa de estos dolores puede ser un virus.  CAUSAS Generalmente no existe una causa definida para este trastorno. Tambin el Citigroupmalestar puede deberse a la Garnavilloactividad excesiva. En la actividad excesiva se incluye el hacer ejercicios fsicos muy intensos cuando no se est en buena forma. El dolor de huesos tambin puede deberse a cambios climticos. Los huesos son sensibles a los cambios en la presin atmosfrica. INSTRUCCIONES PARA EL CUIDADO DOMICILIARIO  Para proteger su privacidad, no se entregarn los The Sherwin-Williamsresultados de las pruebas por telfono. Asegrese de conseguirlos. Consulte el modo en que podr obtenerlos si no se lo han informado. Es su responsabilidad contar con los Lubrizol Corporationresultados de las pruebas.  Utilice los medicamentos de venta libre o de prescripcin para Chief Technology Officerel dolor, Environmental health practitionerel malestar o la Swantonfiebre, segn se lo indique el profesional que lo asiste. Si le han administrado medicamentos, no conduzca, no opere maquinarias ni Diplomatic Services operational officerherramientas elctricas, y tampoco firme documentos legales durante 24 horas. No beba alcohol. No tome pldoras para dormir ni otros medicamentos que Museum/gallery curatorpuedan interferir en el tratamiento.  Podr seguir con todas las actividades a menos que stas le ocasionen ms Merck & Codolor. Cuando el dolor disminuya, es importante que gradualmente reanude toda la rutina habitual. Retome las actividades comenzando lentamente. Aumente gradualmente la intensidad y la duracin de sus actividades o del ejercicio.  Durante los perodos de dolor intenso, el reposo en cama puede ser beneficioso. Recustese o sintese en la posicin que le sea ms cmoda.  Coloque hielo  sobre la zona afectada.  Ponga hielo en Lucile Shuttersuna bolsa.  Colquese una toalla entre la piel y la bolsa de hielo.  Aplique el hielo durante 10 a 20 minutos 3  4 veces por da.  Si el dolor empeora, o no desaparece puede ser Northeast Utilitiesnecesario repetir las pruebas o Education officer, environmentalrealizar nuevos exmenes. El profesional que lo asiste podr requerir investigar ms profundamente para Veterinary surgeonencontrar la causa posible. SOLICITE ATENCIN MDICA DE INMEDIATO SI:  Siente que el dolor empeora y no se alivia con los medicamentos.  Siente dolor en el pecho asociado a falta de aire, sudoracin, nuseas o vmitos.  El dolor se localiza en el abdomen.  Comienza a sentir nuevos sntomas que parecen ser diferentes o que lo preocupan. ASEGRESE DE QUE:   Comprende las instrucciones para el alta mdica.  Controlar su enfermedad.  Solicitar atencin mdica de inmediato segn las indicaciones. Document Released: 08/21/2005 Document Revised: 02/03/2012 [facility] Patient Information 2015 GreenbushExitCare, [place]LLC. This information is not intended to replace advice given to you by your health care provider. Make sure you discuss any questions you have with your health care provider.   Emergency Department Resource Guide 1) Find a Doctor and Pay Out of Pocket Although you won't have to find out who is covered by your insurance plan, it is a good idea to ask around and get recommendations. You will then need to call the office and see if the doctor you have chosen will accept you as a new patient and what types of options they offer for patients who are self-pay. Some doctors offer discounts or will set up payment plans for their patients who do not have insurance, but you will need to ask so you  aren't surprised when you get to your appointment.  2) Contact Your Local Health Department Not all health departments have doctors that can see patients for sick visits, but many do, so it is worth a call to see if yours does. If you don't know where your  local health department is, you can check in your phone book. The CDC also has a tool to help you locate your state's health department, and many state websites also have listings of all of their local health departments.  3) Find a Walk-in Clinic If your illness is not likely to be very severe or complicated, you may want to try a walk in clinic. These are popping up all over the country in pharmacies, drugstores, and shopping centers. They're usually staffed by nurse practitioners or physician assistants that have been trained to treat common illnesses and complaints. They're usually fairly quick and inexpensive. However, if you have serious medical issues or chronic medical problems, these are probably not your best option.  No Primary Care Doctor: - Call Health Connect at  [phone] - they can help you locate a primary care doctor that  accepts your insurance, provides certain services, etc. - Physician Referral Service- [phone]  Chronic Pain Problems: Organization         Address  Phone   Notes  [facility]  [phone] Patients need to be referred by their primary care doctor.   Medication Assistance: Organization         Address  Phone   Notes  [facility] Medication [facility] [address] [place], [place] [address] [phone] --Must be a resident of [facility] [place] -- Must have NO insurance coverage whatsoever (no Medicaid/ Medicare, etc.) -- The pt. MUST have a primary care doctor that directs their care regularly and follows them in the community   MedAssist  [phone]   [employer]  [phone]    Agencies that provide inexpensive medical care: Organization         Address  Phone   Notes  [facility]  [phone]   [facility]    [phone]   [facility] [address] [place], [place] [address] [phone]   [facility] [address]  [address], [place] [phone]   [facility]anned Parenthood    [phone]   [facility]    [phone]   [facility]  [address], [place] Phone:  [phone], Fax:  [phone] Hours of Operation:  9 am - 6 pm, M-F.  Also accepts Medicaid/Medicare and self-pay.  [facility]  [address], [place] Phone: [phone], Fax: [phone]. Hours of Operation:  8:30 am - 5:30 pm, M-F.  Also accepts Medicaid and self-pay.  [facility] [place] [address], [place] Phone: [phone]   [facility] [address], [place], [place] [phone], Ext. [phone] Mondays & Thursdays: 7-9 AM.  First 15 patients are seen on a first come, first serve basis.    Medicaid-accepting [facility] Providers:  Organization         Address  Phone   Notes  [facility] [address], [place] [phone] Also accepts self-pay patients.  [facility] [address], [place]  [phone]  [place], [address], [place] [phone]   [place] [address] [place] [phone]   Lucianne Lei [address], [place]   [phone] Only accepts [place] Access [place] patients after they have their name applied to their card.   Self-Pay (no insurance) in [facility]:  Organization         Address  Phone   Notes  Sickle Cell Patients, [facility] [place] [phone]   [facility] [place] [phone]   [facility] [place]  [place], [place], [place] [phone]   [facility]/Dr. Osei-Bonsu  [address], [place] or [address], [place] [phone] Phone number for both [place] and [place] locations is the same.  [facility]gent [facility] and [facility] [address] [place] [phone]   [facility] [address] [place] or [address] [phone]   [facility] [address] [place] [phone], phone; [phone], fax Sees patients 1st and 3rd Saturday of every month.  Must not qualify for public or private insurance (i.e. Medicaid, Medicare, [place] Health Choice, Veterans' Benefits)  Household income should be no more than 200% of the poverty level The clinic cannot treat you if you are pregnant or think you are pregnant  Sexually transmitted diseases are not treated at the clinic.    Dental Care: Organization         Address  Phone  Notes  [facility] Department of [facility] [place] [phone] Accepts children up to age 76 who are enrolled in [place] or [place]; pregnant women with a Medicaid card; and children who have applied for Medicaid or [place] Health Choice, but were declined, whose parents can pay a reduced fee at time of service.  [facility] Department of [facility]  [address], [place] [phone] Accepts children up to age 27 who are enrolled in [place] or [place]; pregnant women with a Medicaid card; and children who have applied for Medicaid or [place] Health Choice, but were declined, whose parents can pay a reduced fee at time of service.  [facility]  [place] [phone] Patients are seen by appointment only. Walk-ins are not accepted. [place] will see patients 45 years of age and older. Monday - Tuesday (8am-5pm) Most Wednesdays (8:30-5pm) $30 per visit, cash only  [facility] Adult Dental Access PROGRAM  [address], [facility] [phone] Patients are seen by appointment only. Walk-ins are not  accepted. [place] will see patients 67 years of age and older. One Wednesday Evening (Monthly: Volunteer Based).  $30 per visit, cash only  [place]  [phone] for adults; Children under age 65, call Graduate Pediatric Dentistry at [phone]. Children aged 63-14, please call [phone] to request a pediatric application.  Dental services are provided in all areas of dental care including fillings, crowns and bridges, complete and partial dentures, implants, gum treatment, root canals, and extractions. Preventive care is also provided. Treatment is provided to both adults and children. Patients are selected via a lottery and there is often a waiting list.  [facility] [address] [place]  [phone] [email or web address]   [facility] [address] [place], [place] [phone], Ext. [phone] Second and Fourth Thursday of each month, opens at 6:30 AM; Clinic ends at 9 AM.  Patients are seen on a first-come first-served basis, and a limited number are seen during each clinic.   [facility]  [address] Hillard Danker [place], [place] [phone]   Eligibility Requirements You must have lived in [place], [place], or [place] counties for at least the last three months.   You cannot be eligible for state or federal sponsored Apache [employer], including [employer], [place], or [employer].   You generally cannot be eligible for healthcare insurance through your employer.    How to apply: Eligibility screenings are held every Tuesday and Wednesday afternoon from 1:00 pm until 4:00 pm. You do not need an appointment for the interview!  [facility] [address], [place], [place]   [place]  [place] Department  [place]  [phone]    Behavioral Health Resources in the  Community: Intensive Outpatient Programs Organization         Address  Phone  Notes  [place]. [address] [place], [place] [phone]   [facility] [address] [place], [place]   ADS: Alcohol & Drug Svcs [address], [place], [place]   [place] [address],  [place], [place] or [phone]   Substance Abuse Resources Organization         Address  Phone  Notes  Alcohol and Drug Services  [phone]   [place]  [phone]   The [place]   Chinita Pester  [phone]   Residential & Outpatient Substance Abuse Program  [phone]   Psychological Services Organization         Address  Phone  Notes  [facility] [place]  [place]  [phone]   [place] [address], [place] or [phone]    Mobile Crisis Teams Organization         Address  Phone  Notes  [facility]atives, Mobile Crisis Care Unit  [phone]   Assertive Psychotherapeutic Services  [address] [place], [place]   [place] Levels [address] [place] [phone]    Self-Help/Support Groups Organization         Address  Phone             Notes  [place]. of [place] - variety of support groups  [place] Call for more information  Narcotics Anonymous (NA), [facility] [address], [employer] [place]  2 meetings at this location   Special educational needs teacher         Address  Phone  Notes  ASAP Residential Treatment [place],    [place]  [phone]   [facility]  [address], [place] [id number], [place], [place]   [place], [place] [phone] Admissions: 8am-3pm M-F  Incentives Substance [place] [address]    [place], [place] [phone]   [facility] [address] Jadene Pierini [place], [place]   The [place]  Barbara Cower  [place], [place]   Insight Programs - Intensive Outpatient [address], [place], [place]   [facility] [place].,  [place], [place] [phone] or [phone]   [facility] Services (RTS) [address] [place], [place] Accepts Medicaid  [facility]ship [place] [address]  Secretary [place] [phone] Substance Abuse/Addiction Treatment   [facility] Organization         Address  Phone  Notes  [facility]  [phone]   Domenic Schwab, PhD [address] Arlis Porta [place], [place]   [phone] or [phone]   [place], [place] [phone]   [address], [place], [place] [phone] Insurance/Medicaid/sponsorship through [facility] Families [address], [address] [place]                                    [place], [place] [phone] [place] [address], [place] [phone]    Dr. Adele Schilder  [phone]   [facility] Dept. 1) [address], [place] 2) [place] 3)  [place] [address], [place] [phone]  [phone]   [place] [phone] or [phone] (After Hours)

## 2014-11-14 NOTE — ED Notes (Signed)
Per patient-Previously a year and a half ago was walking and knees "couldn't support my weight and I would bend and fall." This went away for awhile. Starting three days ago, unable to walk up stairs. Also having lower bilateral abdominal pain, reports injecting insulin 7 months ago and now has pain on both sides. No bruising or swelling noted. Took tylenol for pain last night with no alleviation of pain. Larey SeatFell this morning when trying to stand because "my knees felt so weak." Also reports she has been vomiting everything she has been eating for the past 20 days. Took pregnancy test-negative. Is not currently on birth control. No other complaints/concerns. Utilized interpreter phone to assess patient.

## 2014-11-14 NOTE — ED Provider Notes (Signed)
Maria Daniel is a 33 year old female who presents the emergency Department complaining of bilateral knee pain and weakness that started one week ago. Patient reports the same pain intermittently over the past 2 years. Patient reports falling this morning because her knees felt weak. Patient reports bearing weight worsens the pain. Patient denies numbness. Patient is also complaining of bilateral abdominal pain that started 1 week ago. Patient reports palpation and lying on her side bilaterally worsens the pain. Patient reports having emesis over the past 20 days along with nausea, however reports eating and drinking normally. She reports using Tylenol and oxycodone for her abdominal pain with mild relief. Patient reports one episode of chest pain last night which was a burning sensation that radiated across her chest, and lasted approximately 20-30 minutes. She denies having any of these complaints currently. She states that chest pain and shortness of breath resolved on its own. Patient denies associated dizziness, syncope, diarrhea, dysuria, vaginal discharge, vaginal bleeding, vaginal pain. Patient reports having a similar abdominal pain when she was using insulin during her pregnancy. Patient states she isn't not currently using insulin, and does not believe she is pregnant.  PE: Constitutional: well-developed, well-nourished, morbidly obese female in no apparent distress HENT: normocephalic, atraumatic Cardiovascular: normal rate and rhythm, distal pulses intact Pulmonary/Chest: effort normal; breath sounds clear and equal bilaterally; no wheezes or rales Abdominal: soft, tenderness to palpation of patient's sides of her abdomen bilaterally with no point tenderness in her abdomen anteriorly. No obvious bruising, ecchymosis, erythema, edema. Musculoskeletal: full ROM, no edema Lymphadenopathy: no cervical adenopathy Neurological: alert with goal directed thinking Skin: warm and dry, no rash,  no diaphoresis Psychiatric: normal mood and affect, normal behavior   Patient here with multiple complaints, we will need blood work and imaging. Not appropriate for fast track, Will place orders transferred to main ED.  MSE was initiated and I personally evaluated the patient and placed orders (if any) on November 14, 2014.  The patient appears stable so that the remainder of the MSE may be completed by another provider.  Signed,  Ladona MowJoe Adrea Sherpa, PA-C 9:08 PM    Monte FantasiaJoseph W Dequavius Kuhner, PA-C 11/14/14 2108  Arby BarretteMarcy Pfeiffer, MD 11/14/14 [phone]

## 2015-09-11 ENCOUNTER — Ambulatory Visit (INDEPENDENT_AMBULATORY_CARE_PROVIDER_SITE_OTHER): Payer: Self-pay | Admitting: *Deleted

## 2015-09-11 DIAGNOSIS — Z3201 Encounter for pregnancy test, result positive: Secondary | ICD-10-CM

## 2015-09-11 LAB — POCT URINE PREGNANCY: PREG TEST UR: POSITIVE — AB

## 2015-09-13 ENCOUNTER — Encounter: Payer: Self-pay | Admitting: Advanced Practice Midwife

## 2015-09-13 ENCOUNTER — Ambulatory Visit (INDEPENDENT_AMBULATORY_CARE_PROVIDER_SITE_OTHER): Payer: Self-pay | Admitting: Advanced Practice Midwife

## 2015-09-13 DIAGNOSIS — Z3481 Encounter for supervision of other normal pregnancy, first trimester: Secondary | ICD-10-CM

## 2015-09-13 DIAGNOSIS — Z3491 Encounter for supervision of normal pregnancy, unspecified, first trimester: Secondary | ICD-10-CM

## 2015-09-13 LAB — POCT URINALYSIS DIP (DEVICE)
Glucose, UA: NEGATIVE mg/dL
Ketones, ur: NEGATIVE mg/dL
LEUKOCYTES UA: NEGATIVE
Nitrite: NEGATIVE
PH: 5.5 (ref 5.0–8.0)
PROTEIN: NEGATIVE mg/dL
SPECIFIC GRAVITY, URINE: 1.025 (ref 1.005–1.030)
UROBILINOGEN UA: 1 mg/dL (ref 0.0–1.0)

## 2015-09-13 NOTE — Progress Notes (Signed)
Pt here today for NEW OB.  Spanish interpreter Alvis.  Pt stated that her LMP was 08/13/15 making her today 5w 6d. Looking in pt's chart- pt had + pregnancy test on 09/11/15 and has not had an US.  Pt informed me that she was informed to come today because she was having back pain.  Pt informed that she would have back pain @ night and when she awakes in the am; she is having no bleeding, no other complaints.  Notified provider and recommended that we schedule an US for the pt.  UA resulted moderate blood- urine culture today; pt c/o burning with urination, 1 hr glucose today for BMI > 30 and hx of GDM, including initial prenatal labs.  Advised pt to take Tylenol for pain as instructed, not taking more than 4,000 mg a day.  Also advised pt to use heating pad, and the use of pelvic stretching.  I also informed pt that her NEW OB appt will be scheduled 6-7 weeks out due to her being so early and that we will f/u with her.  Informed pt that if she any questions to please call the office.  Pt agreed to recommendations/advisments.    US scheduled for Oct 26 @ 1500.  Pt notified.

## 2015-09-14 DIAGNOSIS — O0990 Supervision of high risk pregnancy, unspecified, unspecified trimester: Secondary | ICD-10-CM | POA: Insufficient documentation

## 2015-09-14 LAB — PRENATAL PROFILE (SOLSTAS)
Antibody Screen: NEGATIVE
BASOS ABS: 0 10*3/uL (ref 0.0–0.1)
Basophils Relative: 0 % (ref 0–1)
EOS PCT: 2 % (ref 0–5)
Eosinophils Absolute: 0.2 10*3/uL (ref 0.0–0.7)
HCT: 34.5 % — ABNORMAL LOW (ref 36.0–46.0)
HIV 1&2 Ab, 4th Generation: NONREACTIVE
Hemoglobin: 11.8 g/dL — ABNORMAL LOW (ref 12.0–15.0)
Hepatitis B Surface Ag: NEGATIVE
LYMPHS ABS: 2.9 10*3/uL (ref 0.7–4.0)
LYMPHS PCT: 38 % (ref 12–46)
MCH: 25.2 pg — ABNORMAL LOW (ref 26.0–34.0)
MCHC: 34.2 g/dL (ref 30.0–36.0)
MCV: 73.6 fL — ABNORMAL LOW (ref 78.0–100.0)
MPV: 9 fL (ref 8.6–12.4)
Monocytes Absolute: 0.7 10*3/uL (ref 0.1–1.0)
Monocytes Relative: 9 % (ref 3–12)
Neutro Abs: 3.9 10*3/uL (ref 1.7–7.7)
Neutrophils Relative %: 51 % (ref 43–77)
PLATELETS: 339 10*3/uL (ref 150–400)
RBC: 4.69 MIL/uL (ref 3.87–5.11)
RDW: 13.9 % (ref 11.5–15.5)
RH TYPE: POSITIVE
Rubella: >33 Index — ABNORMAL HIGH (ref <0.90)
WBC: 7.6 10*3/uL (ref 4.0–10.5)

## 2015-09-14 LAB — PRESCRIPTION MONITORING PROFILE (19 PANEL)
Amphetamine/Meth: NEGATIVE ng/mL
BARBITURATE SCREEN, URINE: NEGATIVE ng/mL
BUPRENORPHINE, URINE: NEGATIVE ng/mL
Benzodiazepine Screen, Urine: NEGATIVE ng/mL
CANNABINOID SCRN UR: NEGATIVE ng/mL
Carisoprodol, Urine: NEGATIVE ng/mL
Cocaine Metabolites: NEGATIVE ng/mL
Creatinine, Urine: 163.6 mg/dL (ref >20.0)
Fentanyl, Ur: NEGATIVE ng/mL
MDMA URINE: NEGATIVE ng/mL
Meperidine, Ur: NEGATIVE ng/mL
Methadone Screen, Urine: NEGATIVE ng/mL
Methaqualone: NEGATIVE ng/mL
NITRITES URINE, INITIAL: NEGATIVE ug/mL
OPIATE SCREEN, URINE: NEGATIVE ng/mL
OXYCODONE SCRN UR: NEGATIVE ng/mL
PROPOXYPHENE: NEGATIVE ng/mL
Phencyclidine, Ur: NEGATIVE ng/mL
TAPENTADOLUR: NEGATIVE ng/mL
Tramadol Scrn, Ur: NEGATIVE ng/mL
Zolpidem, Urine: NEGATIVE ng/mL
pH, Initial: 5.2 pH (ref 4.5–8.9)

## 2015-09-14 LAB — GLUCOSE TOLERANCE, 1 HOUR (50G) W/O FASTING: Glucose, 1 Hour GTT: 111 mg/dL (ref 70–140)

## 2015-09-14 LAB — CULTURE, OB URINE
Colony Count: NO GROWTH
ORGANISM ID, BACTERIA: NO GROWTH

## 2015-09-15 LAB — HEMOGLOBINOPATHY EVALUATION
HGB A2 QUANT: 3.5 % — AB (ref 2.2–3.2)
HGB A: 96.2 % — AB (ref 96.8–97.8)
Hemoglobin Other: 0 %
Hgb F Quant: 0.3 % (ref 0.0–2.0)
Hgb S Quant: 0 %

## 2015-09-18 ENCOUNTER — Encounter: Payer: Self-pay | Admitting: *Deleted

## 2015-09-20 ENCOUNTER — Ambulatory Visit (HOSPITAL_COMMUNITY)
Admission: RE | Admit: 2015-09-20 | Discharge: 2015-09-20 | Disposition: A | Discharge status: Home / Self Care | Payer: Self-pay | Source: Ambulatory Visit | Attending: Advanced Practice Midwife | Admitting: Advanced Practice Midwife

## 2015-09-20 DIAGNOSIS — Z3491 Encounter for supervision of normal pregnancy, unspecified, first trimester: Secondary | ICD-10-CM | POA: Insufficient documentation

## 2015-10-23 ENCOUNTER — Encounter: Payer: Self-pay | Admitting: Obstetrics and Gynecology

## 2015-10-24 ENCOUNTER — Encounter: Payer: Self-pay | Admitting: Certified Nurse Midwife

## 2015-10-31 ENCOUNTER — Encounter: Payer: Self-pay | Admitting: Advanced Practice Midwife

## 2015-10-31 ENCOUNTER — Ambulatory Visit (INDEPENDENT_AMBULATORY_CARE_PROVIDER_SITE_OTHER): Payer: Self-pay | Admitting: Advanced Practice Midwife

## 2015-10-31 VITALS — BP 129/72 | HR 85 | Temp 98.8°F | Wt 233.6 lb

## 2015-10-31 DIAGNOSIS — Z3481 Encounter for supervision of other normal pregnancy, first trimester: Secondary | ICD-10-CM

## 2015-10-31 DIAGNOSIS — Z98891 History of uterine scar from previous surgery: Secondary | ICD-10-CM

## 2015-10-31 DIAGNOSIS — Z113 Encounter for screening for infections with a predominantly sexual mode of transmission: Secondary | ICD-10-CM

## 2015-10-31 LAB — POCT URINALYSIS DIP (DEVICE)
Glucose, UA: NEGATIVE mg/dL
Ketones, ur: NEGATIVE mg/dL
Leukocytes, UA: NEGATIVE
NITRITE: NEGATIVE
PH: 5.5 (ref 5.0–8.0)
PROTEIN: NEGATIVE mg/dL
Specific Gravity, Urine: 1.025 (ref 1.005–1.030)
UROBILINOGEN UA: 0.2 mg/dL (ref 0.0–1.0)

## 2015-10-31 NOTE — Progress Notes (Signed)
New OB never done. See smartset  Subjective:    Maria Daniel is a [id number] [redacted]w[redacted]d being seen today for her first obstetrical visit.  Her obstetrical history is significant for advanced maternal age, obesity and Previous C/S. Patient does intend to breast feed. Pregnancy history fully reviewed.  Patient reports nausea and pressure in upper abdomen since catching a falling TV weeks ago.  Filed Vitals:   10/31/15 1557  BP: 129/72  Pulse: 85  Temp: 98.8 F (37.1 C)  Weight: 105.96 kg (233 lb 9.6 oz)    HISTORY: OB History  Gravida Para Term Preterm AB SAB TAB Ectopic Multiple Living  # Outcome Date GA Lbr Len/2nd Weight Sex Delivery Anes PTL Lv  5 Current           4 SAB 05/2014             Comments: System Generated. Please review and update pregnancy details.  3 Term 08/17/12 [redacted]w[redacted]d   M CS-LTranv Other    2 SAB 11/2002          1 Term 06/25/02 [redacted]w[redacted]d 20:00 2.948 kg (6 lb 8 oz) M CS-Unspec EPI  Y     Comments: arrest of dilation     Past Medical History  Diagnosis Date  . Miscarriage   . Hypertension     no meds patient denies  . Diabetes mellitus without complication (HCC)   . Gestational diabetes    Past Surgical History  Procedure Laterality Date  . Cesarean section    . Cesarean section  08/17/2012    Procedure: CESAREAN SECTION;  Surgeon: Lesly Dukes, MD;  Location: [facility];  Service: Obstetrics;  Laterality: N/A;   Family History  Problem Relation Age of Onset  . Hypertension Mother   . Kidney disease Father   . Multiple sclerosis Father      Exam    Uterus:  Fundal Height: 12 cm  Pelvic Exam:    Perineum: n/a   Vulva: deferred   Vagina:  deferred   pH:    Cervix: deferred   Adnexa: not evaluated   Bony Pelvis: gynecoid  System: Breast:  normal appearance, no masses or tenderness   Skin: normal coloration and turgor, no rashes    Neurologic: oriented, grossly non-focal   Extremities: normal strength, tone, and muscle  mass   HEENT neck supple with midline trachea   Mouth/Teeth mucous membranes moist, pharynx normal without lesions   Neck supple and no masses   Cardiovascular: regular rate and rhythm   Respiratory:  appears well, vitals normal, no respiratory distress, acyanotic, normal RR, ear and throat exam is normal   Abdomen: soft, non-tender; bowel sounds normal; no masses,  no organomegaly   Urinary: n/a      Assessment:    Pregnancy: [id number] Patient Active Problem List   Diagnosis Date Noted  . Supervision of normal subsequent pregnancy 09/14/2015  . Borderline hypertension 04/15/2012  . History of C-section 04/14/2012        Plan:     Initial labs drawn. Prenatal vitamins. Problem list reviewed and updated. Genetic Screening discussed First Screen: ordered.  Ultrasound discussed; fetal survey: requested.  Follow up in 4 weeks. 50% of 30 min visit spent on counseling and coordination of care.   Never seen at New OB visit due to being too early.  Other appts cancelled by our office. Reviewed plan of care. Wants C/S  to be done on the 23rd, due to other kids and FOB having been born on 23rd.  This would be postdates for her. She insists her LMP date is more accurate (05/19/16)  I told her we would use her US date.  Warned she might labor before the 23rd but she can discuss scheduling closer to time.    [facility] SurgicalWILLIAMS,Ashani Pumphrey 10/31/2015

## 2015-10-31 NOTE — Patient Instructions (Signed)
Primer trimestre de embarazo  (First Trimester of Pregnancy)  El primer trimestre de embarazo se extiende desde la semana 1 hasta el final de la semana 12 (mes 1 al mes 3). Una semana después de que un espermatozoide fecunda un óvulo, este se implantará en la pared uterina. Este embrión comenzará a desarrollarse hasta convertirse en un bebé. Sus genes y los de su pareja forman el bebé. Los genes del varón determinan si será un niño o una niña. Entre la semana 6 y la 8, se forman los ojos y el rostro, y los latidos del corazón pueden verse en la ecografía. Al final de las 12 semanas, todos los órganos del bebé están formados.   Ahora que está embarazada, querrá hacer todo lo que esté a su alcance para tener un bebé sano. Dos de las cosas más importantes son tener una buena atención prenatal y seguir las indicaciones del médico. La atención prenatal incluye toda la asistencia médica que usted recibe antes del nacimiento del bebé. Esta ayudará a prevenir, detectar y tratar cualquier problema durante el embarazo y el parto.  CAMBIOS EN EL ORGANISMO  Su organismo atraviesa por muchos cambios durante el embarazo, y estos varían de una mujer a otra.   · Al principio, puede aumentar o bajar algunos kilos.  · Puede tener malestar estomacal (náuseas) y vomitar. Si no puede controlar los vómitos, llame al médico.  · Puede cansarse con facilidad.  · Es posible que tenga dolores de cabeza que pueden aliviarse con los medicamentos que el médico le permita tomar.  · Puede orinar con mayor frecuencia. El dolor al orinar puede significar que usted tiene una infección de la vejiga.  · Debido al embarazo, puede tener acidez estomacal.  · Puede estar estreñida, ya que ciertas hormonas enlentecen los movimientos de los músculos que empujan los desechos a través de los intestinos.  · Pueden aparecer hemorroides o abultarse e hincharse las venas (venas varicosas).  · Las mamas pueden empezar a agrandarse y estar sensibles. Los pezones  pueden sobresalir más, y el tejido que los rodea (areola) tornarse más oscuro.  · Las encías pueden sangrar y estar sensibles al cepillado y al hilo dental.  · Pueden aparecer zonas oscuras o manchas (cloasma, máscara del embarazo) en el rostro que probablemente se atenuarán después del nacimiento del bebé.  · Los períodos menstruales se interrumpirán.  · Tal vez no tenga apetito.  · Puede sentir un fuerte deseo de consumir ciertos alimentos.  · Puede tener cambios a nivel emocional día a día, por ejemplo, por momentos puede estar emocionada por el embarazo y por otros preocuparse porque algo pueda salir mal con el embarazo o el bebé.  · Tendrá sueños más vívidos y extraños.  · Tal vez haya cambios en el cabello que pueden incluir su engrosamiento, crecimiento rápido y cambios en la textura. A algunas mujeres también se les cae el cabello durante o después del embarazo, o tienen el cabello seco o fino. Lo más probable es que el cabello se le normalice después del nacimiento del bebé.  QUÉ DEBE ESPERAR EN LAS CONSULTAS PRENATALES  Durante una visita prenatal de rutina:  · La pesarán para asegurarse de que usted y el bebé están creciendo normalmente.  · Le controlarán la presión arterial.  · Le medirán el abdomen para controlar el desarrollo del bebé.  · Se escucharán los latidos cardíacos a partir de la semana 10 o la 12 de embarazo, aproximadamente.  · Se analizarán los resultados de los estudios solicitados en visitas anteriores.  El   médico puede preguntarle:  · Cómo se siente.  · Si siente los movimientos del bebé.  · Si ha tenido síntomas anormales, como pérdida de líquido, sangrado, dolores de cabeza intensos o cólicos abdominales.  · Si está consumiendo algún producto que contenga tabaco, como cigarrillos, tabaco de mascar y cigarrillos electrónicos.  · Si tiene alguna pregunta.  Otros estudios que pueden realizarse durante el primer trimestre incluyen lo siguiente:  · Análisis de sangre para determinar el tipo  de sangre y detectar la presencia de infecciones previas. Además, se los usará para controlar si los niveles de hierro son bajos (anemia) y determinar los anticuerpos Rh. En una etapa más avanzada del embarazo, se harán análisis de sangre para saber si tiene diabetes, junto con otros estudios si surgen problemas.  · Análisis de orina para detectar infecciones, diabetes o proteínas en la orina.  · Una ecografía para confirmar que el bebé crece y se desarrolla correctamente.  · Una amniocentesis para diagnosticar posibles problemas genéticos.  · Estudios del feto para descartar espina bífida y síndrome de Down.  · Es posible que necesite otras pruebas adicionales.  · Prueba del VIH (virus de inmunodeficiencia humana). Los exámenes prenatales de rutina incluyen la prueba de detección del VIH, a menos que decida no realizársela.  INSTRUCCIONES PARA EL CUIDADO EN EL HOGAR   Medicamentos:  · Siga las indicaciones del médico en relación con el uso de medicamentos. Durante el embarazo, hay medicamentos que pueden tomarse y otros que no.  · Tome las vitaminas prenatales como se le indicó.  · Si está estreñida, tome un laxante suave, si el médico lo autoriza.  Dieta  · Consuma alimentos balanceados. Elija alimentos variados, como carne o proteínas de origen vegetal, pescado, leche y productos lácteos descremados, verduras, frutas y panes y cereales integrales. El médico la ayudará a determinar la cantidad de peso que puede aumentar.  · No coma carne cruda ni quesos sin cocinar. Estos elementos contienen bacterias que pueden causar defectos congénitos en el bebé.  · La ingesta diaria de cuatro o cinco comidas pequeñas en lugar de tres comidas abundantes puede ayudar a aliviar las náuseas y los vómitos. Si empieza a tener náuseas, comer algunas galletas saladas puede ser de ayuda. Beber líquidos entre las comidas en lugar de tomarlos durante las comidas también puede ayudar a calmar las náuseas y los vómitos.  · Si está  estreñida, consuma alimentos con alto contenido de fibra, como verduras y frutas frescas, y cereales integrales. Beba suficiente líquido para mantener la orina clara o de color amarillo pálido.  Actividad y ejercicios  · Haga ejercicio solamente como se lo haya indicado el médico. El ejercicio la ayudará a:    Controlar el peso.    Mantenerse en forma.    Estar preparada para el trabajo de parto y el parto.  · Los dolores, los cólicos en la parte baja del abdomen o los calambres en la cintura son un buen indicio de que debe dejar de hacer ejercicios. Consulte al médico antes de seguir haciendo ejercicios normales.  · Intente no estar de pie durante mucho tiempo. Mueva las piernas con frecuencia si debe estar de pie en un lugar durante mucho tiempo.  · Evite levantar pesos excesivos.  · Use zapatos de tacones bajos y mantenga una buena postura.  · Puede seguir teniendo relaciones sexuales, excepto que el médico le indique lo contrario.  Alivio del dolor o las molestias  · Use un sostén que le brinde buen   soporte si siente dolor a la palpación en las mamas.  · Dese baños de asiento con agua tibia para aliviar el dolor o las molestias causadas por las hemorroides. Use crema antihemorroidal si el médico se lo permite.  · Descanse con las piernas elevadas si tiene calambres o dolor de cintura.  · Si tiene venas varicosas en las piernas, use medias de descanso. Eleve los pies durante 15 minutos, 3 o 4 veces por día. Limite la cantidad de sal en su dieta.  Cuidados prenatales  · Programe las visitas prenatales para la semana 12 de embarazo. Generalmente se programan cada mes al principio y se hacen más frecuentes en los 2 últimos meses antes del parto.  · Escriba sus preguntas. Llévelas cuando concurra a las visitas prenatales.  · Concurra a todas las visitas prenatales como se lo haya indicado el médico.  Seguridad  · Colóquese el cinturón de seguridad cuando conduzca.  · Haga una lista de los números de teléfono de  emergencia, que incluya los números de teléfono de familiares, amigos, el hospital y los departamentos de policía y bomberos.  Consejos generales  · Pídale al médico que la derive a clases de educación prenatal en su localidad. Debe comenzar a tomar las clases antes de entrar en el mes 6 de embarazo.  · Pida ayuda si tiene necesidades nutricionales o de asesoramiento durante el embarazo. El médico puede aconsejarla o derivarla a especialistas para que la ayuden con diferentes necesidades.  · No se dé baños de inmersión en agua caliente, baños turcos ni saunas.  · No se haga duchas vaginales ni use tampones o toallas higiénicas perfumadas.  · No mantenga las piernas cruzadas durante mucho tiempo.  · Evite el contacto con las bandejas sanitarias de los gatos y la tierra que estos animales usan. Estos elementos contienen bacterias que pueden causar defectos congénitos al bebé y la posible pérdida del feto debido a un aborto espontáneo o muerte fetal.  · No fume, no consuma hierbas ni medicamentos que no hayan sido recetados por el médico. Las sustancias químicas que estos productos contienen afectan la formación y el desarrollo del bebé.  · No consuma ningún producto que contenga tabaco, lo que incluye cigarrillos, tabaco de mascar y cigarrillos electrónicos. Si necesita ayuda para dejar de fumar, consulte al médico. Puede recibir asesoramiento y otro tipo de recursos para dejar de fumar.  · Programe una cita con el dentista. En su casa, lávese los dientes con un cepillo dental blando y pásese el hilo dental con suavidad.  SOLICITE ATENCIÓN MÉDICA SI:   · Tiene mareos.  · Siente cólicos leves, presión en la pelvis o dolor persistente en el abdomen.  · Tiene náuseas, vómitos o diarrea persistentes.  · Tiene secreción vaginal con mal olor.  · Siente dolor al orinar.  · Tiene el rostro, las manos, las piernas o los tobillos más hinchados.  SOLICITE ATENCIÓN MÉDICA DE INMEDIATO SI:   · Tiene fiebre.  · Tiene una pérdida de  líquido por la vagina.  · Tiene sangrado o pequeñas pérdidas vaginales.  · Siente dolor intenso o cólicos en el abdomen.  · Sube o baja de peso rápidamente.  · Vomita sangre de color rojo brillante o material que parezca granos de café.  · Ha estado expuesta a la rubéola y no ha sufrido la enfermedad.  · Ha estado expuesta a la quinta enfermedad o a la varicela.  · Tiene un dolor de cabeza intenso.  · Le falta el aire.  · Sufre   cualquier tipo de traumatismo, por ejemplo, debido a una caída o un accidente automovilístico.     Esta información no tiene como fin reemplazar el consejo del médico. Asegúrese de hacerle al médico cualquier pregunta que tenga.     Document Released: 08/21/2005 Document Revised: 12/02/2014  Elsevier Interactive Patient Education ©2016 Elsevier Inc.

## 2015-10-31 NOTE — Progress Notes (Signed)
[employer] visitornterpreter Maria Daniel. C/o abdominal / hip pain. C/o feels like contractions. Denies vaginal bleeding. States had an accident at home where the tv fell and she caught it and it hit her chest and abdomen about 10 days ago. States since then no bleeding but having some nausea, and then abdominal pain. States thought she felt the baby 20 days ago, but not since then. Given pregnancy education packet.

## 2015-11-01 LAB — GC/CHLAMYDIA PROBE AMP (~~LOC~~) NOT AT ARMC
Chlamydia: NEGATIVE
Neisseria Gonorrhea: NEGATIVE

## 2015-11-09 ENCOUNTER — Encounter (HOSPITAL_COMMUNITY): Payer: Self-pay

## 2015-11-09 ENCOUNTER — Ambulatory Visit (HOSPITAL_COMMUNITY): Admission: RE | Admit: 2015-11-09 | Payer: Self-pay | Source: Ambulatory Visit

## 2015-11-09 ENCOUNTER — Ambulatory Visit (HOSPITAL_COMMUNITY)
Admission: RE | Admit: 2015-11-09 | Discharge: 2015-11-09 | Disposition: A | Discharge status: Home / Self Care | Payer: Self-pay | Source: Ambulatory Visit | Attending: Advanced Practice Midwife | Admitting: Advanced Practice Midwife

## 2015-11-09 ENCOUNTER — Other Ambulatory Visit: Payer: Self-pay | Admitting: Advanced Practice Midwife

## 2015-11-09 VITALS — BP 139/68 | HR 108 | Wt 237.4 lb

## 2015-11-09 DIAGNOSIS — O09521 Supervision of elderly multigravida, first trimester: Secondary | ICD-10-CM | POA: Insufficient documentation

## 2015-11-09 DIAGNOSIS — O99211 Obesity complicating pregnancy, first trimester: Secondary | ICD-10-CM | POA: Insufficient documentation

## 2015-11-09 DIAGNOSIS — Z8632 Personal history of gestational diabetes: Secondary | ICD-10-CM

## 2015-11-09 DIAGNOSIS — Z369 Encounter for antenatal screening, unspecified: Secondary | ICD-10-CM

## 2015-11-09 DIAGNOSIS — O09291 Supervision of pregnancy with other poor reproductive or obstetric history, first trimester: Secondary | ICD-10-CM

## 2015-11-09 DIAGNOSIS — O10011 Pre-existing essential hypertension complicating pregnancy, first trimester: Secondary | ICD-10-CM | POA: Insufficient documentation

## 2015-11-09 DIAGNOSIS — Z3481 Encounter for supervision of other normal pregnancy, first trimester: Secondary | ICD-10-CM

## 2015-11-09 DIAGNOSIS — O161 Unspecified maternal hypertension, first trimester: Secondary | ICD-10-CM

## 2015-11-09 DIAGNOSIS — Z3A13 13 weeks gestation of pregnancy: Secondary | ICD-10-CM

## 2015-11-09 DIAGNOSIS — O34219 Maternal care for unspecified type scar from previous cesarean delivery: Secondary | ICD-10-CM | POA: Insufficient documentation

## 2015-11-09 DIAGNOSIS — Z36 Encounter for antenatal screening of mother: Secondary | ICD-10-CM | POA: Insufficient documentation

## 2015-11-28 ENCOUNTER — Ambulatory Visit (INDEPENDENT_AMBULATORY_CARE_PROVIDER_SITE_OTHER): Payer: Self-pay | Admitting: Family Medicine

## 2015-11-28 VITALS — BP 96/84 | HR 119 | Temp 98.2°F | Wt 240.0 lb

## 2015-11-28 DIAGNOSIS — R309 Painful micturition, unspecified: Secondary | ICD-10-CM

## 2015-11-28 DIAGNOSIS — R300 Dysuria: Secondary | ICD-10-CM

## 2015-11-28 DIAGNOSIS — O26892 Other specified pregnancy related conditions, second trimester: Secondary | ICD-10-CM

## 2015-11-28 DIAGNOSIS — Z98891 History of uterine scar from previous surgery: Secondary | ICD-10-CM

## 2015-11-28 DIAGNOSIS — Z3482 Encounter for supervision of other normal pregnancy, second trimester: Secondary | ICD-10-CM

## 2015-11-28 LAB — POCT URINALYSIS DIP (DEVICE)
GLUCOSE, UA: 250 mg/dL — AB
KETONES UR: NEGATIVE mg/dL
NITRITE: NEGATIVE
PH: 5.5 (ref 5.0–8.0)
PROTEIN: 30 mg/dL — AB
Specific Gravity, Urine: 1.02 (ref 1.005–1.030)
Urobilinogen, UA: 1 mg/dL (ref 0.0–1.0)

## 2015-11-28 MED ORDER — NITROFURANTOIN MONOHYD MACRO 100 MG PO CAPS: 100.0000 mg | ORAL_CAPSULE | Freq: Two times a day (BID) | ORAL | Status: DC

## 2015-11-28 NOTE — Patient Instructions (Addendum)
Second Trimester of Pregnancy The second trimester is from week 13 through week 28, months 4 through 6. The second trimester is often a time when you feel your best. Your body has also adjusted to being pregnant, and you begin to feel better physically. Usually, morning sickness has lessened or quit completely, you may have more energy, and you may have an increase in appetite. The second trimester is also a time when the fetus is growing rapidly. At the end of the sixth month, the fetus is about 9 inches long and weighs about 1 pounds. You will likely begin to feel the baby move (quickening) between 18 and 20 weeks of the pregnancy. BODY CHANGES Your body goes through many changes during pregnancy. The changes vary from woman to woman.   Your weight will continue to increase. You will notice your lower abdomen bulging out.  You may begin to get stretch marks on your hips, abdomen, and breasts.  You may develop headaches that can be relieved by medicines approved by your health care provider.  You may urinate more often because the fetus is pressing on your bladder.  You may develop or continue to have heartburn as a result of your pregnancy.  You may develop constipation because certain hormones are causing the muscles that push waste through your intestines to slow down.  You may develop hemorrhoids or swollen, bulging veins (varicose veins).  You may have back pain because of the weight gain and pregnancy hormones relaxing your joints between the bones in your pelvis and as a result of a shift in weight and the muscles that support your balance.  Your breasts will continue to grow and be tender.  Your gums may bleed and may be sensitive to brushing and flossing.  Dark spots or blotches (chloasma, mask of pregnancy) may develop on your face. This will likely fade after the baby is born.  A dark line from your belly button to the pubic area (linea nigra) may appear. This will likely fade  after the baby is born.  You may have changes in your hair. These can include thickening of your hair, rapid growth, and changes in texture. Some women also have hair loss during or after pregnancy, or hair that feels dry or thin. Your hair will most likely return to normal after your baby is born. WHAT TO EXPECT AT YOUR PRENATAL VISITS During a routine prenatal visit:  You will be weighed to make sure you and the fetus are growing normally.  Your blood pressure will be taken.  Your abdomen will be measured to track your baby's growth.  The fetal heartbeat will be listened to.  Any test results from the previous visit will be discussed. Your health care provider may ask you:  How you are feeling.  If you are feeling the baby move.  If you have had any abnormal symptoms, such as leaking fluid, bleeding, severe headaches, or abdominal cramping.  If you are using any tobacco products, including cigarettes, chewing tobacco, and electronic cigarettes.  If you have any questions. Other tests that may be performed during your second trimester include:  Blood tests that check for:  Low iron levels (anemia).  Gestational diabetes (between 24 and 28 weeks).  Rh antibodies.  Urine tests to check for infections, diabetes, or protein in the urine.  An ultrasound to confirm the proper growth and development of the baby.  An amniocentesis to check for possible genetic problems.  Fetal screens for spina bifida   and Down syndrome.  HIV (human immunodeficiency virus) testing. Routine prenatal testing includes screening for HIV, unless you choose not to have this test. HOME CARE INSTRUCTIONS   Avoid all smoking, herbs, alcohol, and unprescribed drugs. These chemicals affect the formation and growth of the baby.  Do not use any tobacco products, including cigarettes, chewing tobacco, and electronic cigarettes. If you need help quitting, ask your health care provider. You may receive  counseling support and other resources to help you quit.  Follow your health care provider's instructions regarding medicine use. There are medicines that are either safe or unsafe to take during pregnancy.  Exercise only as directed by your health care provider. Experiencing uterine cramps is a good sign to stop exercising.  Continue to eat regular, healthy meals.  Wear a good support bra for breast tenderness.  Do not use hot tubs, steam rooms, or saunas.  Wear your seat belt at all times when driving.  Avoid raw meat, uncooked cheese, cat litter boxes, and soil used by cats. These carry germs that can cause birth defects in the baby.  Take your prenatal vitamins.  Take 1500-2000 mg of calcium daily starting at the 20th week of pregnancy until you deliver your baby.  Try taking a stool softener (if your health care provider approves) if you develop constipation. Eat more high-fiber foods, such as fresh vegetables or fruit and whole grains. Drink plenty of fluids to keep your urine clear or pale yellow.  Take warm sitz baths to soothe any pain or discomfort caused by hemorrhoids. Use hemorrhoid cream if your health care provider approves.  If you develop varicose veins, wear support hose. Elevate your feet for 15 minutes, 3-4 times a day. Limit salt in your diet.  Avoid heavy lifting, wear low heel shoes, and practice good posture.  Rest with your legs elevated if you have leg cramps or low back pain.  Visit your dentist if you have not gone yet during your pregnancy. Use a soft toothbrush to brush your teeth and be gentle when you floss.  A sexual relationship may be continued unless your health care provider directs you otherwise.  Continue to go to all your prenatal visits as directed by your health care provider. SEEK MEDICAL CARE IF:   You have dizziness.  You have mild pelvic cramps, pelvic pressure, or nagging pain in the abdominal area.  You have persistent nausea,  vomiting, or diarrhea.  You have a bad smelling vaginal discharge.  You have pain with urination. SEEK IMMEDIATE MEDICAL CARE IF:   You have a fever.  You are leaking fluid from your vagina.  You have spotting or bleeding from your vagina.  You have severe abdominal cramping or pain.  You have rapid weight gain or loss.  You have shortness of breath with chest pain.  You notice sudden or extreme swelling of your face, hands, ankles, feet, or legs.  You have not felt your baby move in over an hour.  You have severe headaches that do not go away with medicine.  You have vision changes.   This information is not intended to replace advice given to you by your health care provider. Make sure you discuss any questions you have with your health care provider.   Document Released: 11/05/2001 Document Revised: 12/02/2014 Document Reviewed: 01/12/2013 Elsevier Interactive Patient Education 2016 [employer].  [employer] medicinas seguras para tomar durante el embarazo  Safe Medications in Pregnancy  Acn:  Benzoyl Peroxide (Perxido de benzolo)  Salicylic Acid (cido saliclico)  Dolor de espalda/Dolor de cabeza:  Tylenol: 2 pastillas de concentracin regular cada 4 horas O 2 pastillas de concentracin fuerte cada 6 horas  Resfriados/Tos/Alergias:  Benadryl (sin alcohol) 25 mg cada 6 horas segn lo necesite Breath Right strips (Tiras para respirar correctamente)  Claritin  Cepacol (pastillas de chupar para la garganta)  Chloraseptic (aerosol para la garganta)  Cold-Eeze- hasta tres veces por da  Cough drops (pastillas de chupar para la tos, sin alcohol)  Flonase (con receta mdica solamente)  Guaifenesin  Mucinex  Robitussin DM (simple solamente, sin alcohol)  Saline nasal spray/drops (Aerosol nasal salino/gotas) Sudafed (pseudoephedrine) y  Actifed * utilizar slo despus de 12 semanas de gestacin y si no tiene la presin arterial alta.  Tylenol Vicks  VapoRub  Zinc  lozenges (pastillas para la garganta)  Zyrtec  Estreimiento:  Colace  Ducolax (supositorios)  Fleet enema (lavado intestinal rectal)  Glycerin (supositorios)  Metamucil  Milk of magnesia (leche de magnesia)  Miralax  Senokot  Smooth Move (t)  Diarrea:  Kaopectate Imodium A-D  *NO tome Pepto-Bismol  Hemorroides:  Anusol  Anusol HC  Preparation H  Tucks  Indigestin:  Tums  Maalox  Mylanta  Zantac  Pepcid  Insomnia:  Benadryl (sin alcohol) 25mg  cada 6 horas segn lo necesite  Tylenol PM  Unisom, no Gelcaps  Calambres en las piernas:  Tums  MagGel Nuseas/Vmitos:  Bonine  Dramamine  Emetrol  Ginger (extracto)  Sea-Bands  Meclizine  Medicina para las nuseas que puede tomar durante el embarazo: Unisom (doxylamine succinate, pastillas de 25 mg) Tome una pastilla al da al [place]ostarse. Si los sntomas no estn adecuadamente controlados, la dosis puede aumentarse hasta una dosis mxima recomendada de Liberty Mutualdos pastillas al da (1/2 pastilla por la [place], 1/2 pastilla a media tarde y Neomia Dearuna pastilla al Strumacostarse). Pastillas de Vitamina B6 de 100mg . Tome ConAgra Foodsuna pastilla dos veces al da (hasta 200 mg por da).   Erupciones en la piel:  Productos de Aveeno  Benadryl cream (crema o una dosis de 25mg  cada 6 horas segn lo necesite)  Calamine Lotion (locin)  1% cortisone cream (crema de cortisona de 1%)   Infeccin vaginal por hongos (candidiasis):  Gyne-lotrimin 7  Monistat 7   **Si est tomando varias medicinas, por favor revise las etiquetas para Art gallery managerevitar duplicar los mismos ingredientes Ventressactivos. **Tome la medicina segn lo indicado en la etiqueta. **No tome ms de 400 mg de Tylenol en 24 horas. **No tome medicinas que contengan aspirina o ibuprofeno.

## 2015-11-28 NOTE — Progress Notes (Signed)
Subjective:  Maria Daniel is a 35 y.o. [phone] at 1223w3d being seen today for ongoing prenatal care.  She is currently monitored for the following issues for this low-risk pregnancy and has History of C-section; Borderline hypertension; and Supervision of normal subsequent pregnancy on her problem list.  Patient reports Dysuria for 7 days, burning with urination and some itching. Also reports pain in her mid-abdomen/suprapubic. Marland Kitchen.Denies vaginal discharge.   Contractions: Not present. Vag. Bleeding: None.  Movement: Absent. Denies leaking of fluid.   The following portions of the patient's history were reviewed and updated as appropriate: allergies, current medications, past family history, past medical history, past social history, past surgical history and problem list. Problem list updated.  Objective:   Filed Vitals:   11/28/15 1555  BP: 96/84  Pulse: 119  Temp: 98.2 F (36.8 C)  Weight: 240 lb (108.863 kg)    Fetal Status: Fetal Heart Rate (bpm): 156   Movement: Absent     General:  Alert, oriented and cooperative. Patient is in no acute distress.  Skin: Skin is warm and dry. No rash noted.   Cardiovascular: Normal heart rate noted  Respiratory: Normal respiratory effort, no problems with respiration noted  Abdomen: Soft, gravid, appropriate for gestational age. + TTP in suprapubic region.  Pain/Pressure: Present     Pelvic: Vag. Bleeding: None Vag D/C Character: Yellow   Cervical exam deferred        Extremities: Normal range of motion.  Edema: Trace  Mental Status: Normal mood and affect. Normal behavior. Normal judgment and thought content.   Urinalysis: Urine Protein: 3+ Urine Glucose: 1+  Assessment and Plan:  Pregnancy: [id number] at 2823w3d  1. Encounter for supervision of other normal pregnancy in second trimester - Updated box - US MFM OB COMP + 14 WK; Future - Culture, OB Urine - AFP, Quad Screen  2. History of C-section -Repeat CS  3. Dysuria during  pregnancy, antepartum, second trimester - classic sx and exam. No discharge. Will treat like UTI and send urine for culture -empirically treat with nitrofurantoin, macrocrystal-monohydrate, (MACROBID) 100 MG capsule; Take 1 capsule (100 mg total) by mouth 2 (two) times daily.  Dispense: 14 capsule; Refill: 0   Preterm labor symptoms and general obstetric precautions including but not limited to vaginal bleeding, contractions, leaking of fluid and fetal movement were reviewed in detail with the patient. Please refer to After Visit Summary for other counseling recommendations.  Return in about 4 weeks (around 12/26/2015) for Routine prenatal care.  Future Appointments Date Time Provider Department Center  12/19/2015 3:00 PM [facility] US 4 [facility]  12/26/2015 3:20 PM Aviva SignsMarie L Williams, CNM [facility]   Federico FlakeKimberly Niles Rakiyah Esch, MD

## 2015-11-28 NOTE — Progress Notes (Signed)
Patient reports burning with urination and difficulty emptying her bladder at times for past week-please review UA  Patient also reports yellow d/c with itching  Patient reports a lot of hip pain &  Pelvic pressure Dori used for interpreter

## 2015-11-29 LAB — CULTURE, OB URINE
Colony Count: NO GROWTH
Organism ID, Bacteria: NO GROWTH

## 2015-11-30 LAB — AFP, QUAD SCREEN
AFP: 27.5 ng/mL
Age Alone: 1:205 {titer}
CURR GEST AGE: 16.3 wks.days
Down Syndrome Scr Risk Est: 1:6990 {titer}
HCG TOTAL: 15.35 [IU]/mL
INH: 91.5 pg/mL
INTERPRETATION-AFP: NEGATIVE
MOM FOR HCG: 0.67
MOM FOR INH: 0.83
MoM for AFP: 1.15
OPEN SPINA BIFIDA: NEGATIVE
Osb Risk: 1:8070 {titer}
Tri 18 Scr Risk Est: NEGATIVE
Trisomy 18 (Edward) Syndrome Interp.: 1:3810 {titer}
UE3 MOM: 0.78
uE3 Value: 0.69 ng/mL

## 2015-12-07 ENCOUNTER — Ambulatory Visit (INDEPENDENT_AMBULATORY_CARE_PROVIDER_SITE_OTHER): Payer: Self-pay

## 2015-12-07 DIAGNOSIS — O2342 Unspecified infection of urinary tract in pregnancy, second trimester: Secondary | ICD-10-CM

## 2015-12-07 LAB — POCT URINALYSIS DIPSTICK
Glucose, UA: NEGATIVE
Ketones, UA: NEGATIVE
NITRITE UA: NEGATIVE
PH UA: 5.5
PROTEIN UA: 100
Spec Grav, UA: >=1.03
Urobilinogen, UA: 1

## 2015-12-07 NOTE — Progress Notes (Signed)
Pt comes into clinic as a nurse visit for UTI symptoms. She states she was just finish up antibiotic for UTI but symptoms have returned and now patient has concern about her bleeding in her stools. Urine culture has been obtained and sent to lab. Per Dr.Newton go ahead and treat. After talking with manager and provider she been scheduled an appt on 0/16/2017@ 3:00. Pt verbalizes understanding with the help of interpreter from Language resources.

## 2015-12-08 ENCOUNTER — Encounter: Payer: Self-pay | Admitting: Obstetrics & Gynecology

## 2015-12-09 LAB — CULTURE, OB URINE

## 2015-12-11 ENCOUNTER — Encounter: Payer: Self-pay | Admitting: Obstetrics & Gynecology

## 2015-12-19 ENCOUNTER — Other Ambulatory Visit: Payer: Self-pay | Admitting: Family Medicine

## 2015-12-19 ENCOUNTER — Ambulatory Visit (HOSPITAL_COMMUNITY)
Admission: RE | Admit: 2015-12-19 | Discharge: 2015-12-19 | Disposition: A | Discharge status: Home / Self Care | Payer: Self-pay | Source: Ambulatory Visit | Attending: Family Medicine | Admitting: Family Medicine

## 2015-12-19 DIAGNOSIS — Z3689 Encounter for other specified antenatal screening: Secondary | ICD-10-CM

## 2015-12-19 DIAGNOSIS — Z3A19 19 weeks gestation of pregnancy: Secondary | ICD-10-CM

## 2015-12-19 DIAGNOSIS — O34219 Maternal care for unspecified type scar from previous cesarean delivery: Secondary | ICD-10-CM

## 2015-12-19 DIAGNOSIS — R030 Elevated blood-pressure reading, without diagnosis of hypertension: Secondary | ICD-10-CM

## 2015-12-19 DIAGNOSIS — Z36 Encounter for antenatal screening of mother: Secondary | ICD-10-CM | POA: Insufficient documentation

## 2015-12-19 DIAGNOSIS — Z3482 Encounter for supervision of other normal pregnancy, second trimester: Secondary | ICD-10-CM

## 2015-12-26 ENCOUNTER — Ambulatory Visit (INDEPENDENT_AMBULATORY_CARE_PROVIDER_SITE_OTHER): Payer: Self-pay | Admitting: Advanced Practice Midwife

## 2015-12-26 ENCOUNTER — Encounter: Payer: Self-pay | Admitting: Advanced Practice Midwife

## 2015-12-26 VITALS — BP 114/62 | HR 97 | Temp 98.4°F | Wt 246.4 lb

## 2015-12-26 DIAGNOSIS — Z98891 History of uterine scar from previous surgery: Secondary | ICD-10-CM

## 2015-12-26 DIAGNOSIS — Z3482 Encounter for supervision of other normal pregnancy, second trimester: Secondary | ICD-10-CM

## 2015-12-26 LAB — POCT URINALYSIS DIP (DEVICE)
Glucose, UA: NEGATIVE mg/dL
KETONES UR: NEGATIVE mg/dL
LEUKOCYTES UA: NEGATIVE
Nitrite: NEGATIVE
PH: 6 (ref 5.0–8.0)
Protein, ur: 30 mg/dL — AB
Urobilinogen, UA: 1 mg/dL (ref 0.0–1.0)

## 2015-12-26 NOTE — Patient Instructions (Addendum)
Second Trimester of Pregnancy The second trimester is from week 13 through week 28, months 4 through 6. The second trimester is often a time when you feel your best. Your body has also adjusted to being pregnant, and you begin to feel better physically. Usually, morning sickness has lessened or quit completely, you may have more energy, and you may have an increase in appetite. The second trimester is also a time when the fetus is growing rapidly. At the end of the sixth month, the fetus is about 9 inches long and weighs about 1 pounds. You will likely begin to feel the baby move (quickening) between 18 and 20 weeks of the pregnancy. BODY CHANGES Your body goes through many changes during pregnancy. The changes vary from woman to woman.   Your weight will continue to increase. You will notice your lower abdomen bulging out.  You may begin to get stretch marks on your hips, abdomen, and breasts.  You may develop headaches that can be relieved by medicines approved by your health care provider.  You may urinate more often because the fetus is pressing on your bladder.  You may develop or continue to have heartburn as a result of your pregnancy.  You may develop constipation because certain hormones are causing the muscles that push waste through your intestines to slow down.  You may develop hemorrhoids or swollen, bulging veins (varicose veins).  You may have back pain because of the weight gain and pregnancy hormones relaxing your joints between the bones in your pelvis and as a result of a shift in weight and the muscles that support your balance.  Your breasts will continue to grow and be tender.  Your gums may bleed and may be sensitive to brushing and flossing.  Dark spots or blotches (chloasma, mask of pregnancy) may develop on your face. This will likely fade after the baby is born.  A dark line from your belly button to the pubic area (linea nigra) may appear. This will likely fade  after the baby is born.  You may have changes in your hair. These can include thickening of your hair, rapid growth, and changes in texture. Some women also have hair loss during or after pregnancy, or hair that feels dry or thin. Your hair will most likely return to normal after your baby is born. WHAT TO EXPECT AT YOUR PRENATAL VISITS During a routine prenatal visit:  You will be weighed to make sure you and the fetus are growing normally.  Your blood pressure will be taken.  Your abdomen will be measured to track your baby's growth.  The fetal heartbeat will be listened to.  Any test results from the previous visit will be discussed. Your health care provider may ask you:  How you are feeling.  If you are feeling the baby move.  If you have had any abnormal symptoms, such as leaking fluid, bleeding, severe headaches, or abdominal cramping.  If you are using any tobacco products, including cigarettes, chewing tobacco, and electronic cigarettes.  If you have any questions. Other tests that may be performed during your second trimester include:  Blood tests that check for:  Low iron levels (anemia).  Gestational diabetes (between 24 and 28 weeks).  Rh antibodies.  Urine tests to check for infections, diabetes, or protein in the urine.  An ultrasound to confirm the proper growth and development of the baby.  An amniocentesis to check for possible genetic problems.  Fetal screens for spina bifida   and Down syndrome.  HIV (human immunodeficiency virus) testing. Routine prenatal testing includes screening for HIV, unless you choose not to have this test. HOME CARE INSTRUCTIONS   Avoid all smoking, herbs, alcohol, and unprescribed drugs. These chemicals affect the formation and growth of the baby.  Do not use any tobacco products, including cigarettes, chewing tobacco, and electronic cigarettes. If you need help quitting, ask your health care provider. You may receive  counseling support and other resources to help you quit.  Follow your health care provider's instructions regarding medicine use. There are medicines that are either safe or unsafe to take during pregnancy.  Exercise only as directed by your health care provider. Experiencing uterine cramps is a good sign to stop exercising.  Continue to eat regular, healthy meals.  Wear a good support bra for breast tenderness.  Do not use hot tubs, steam rooms, or saunas.  Wear your seat belt at all times when driving.  Avoid raw meat, uncooked cheese, cat litter boxes, and soil used by cats. These carry germs that can cause birth defects in the baby.  Take your prenatal vitamins.  Take 1500-2000 mg of calcium daily starting at the 20th week of pregnancy until you deliver your baby.  Try taking a stool softener (if your health care provider approves) if you develop constipation. Eat more high-fiber foods, such as fresh vegetables or fruit and whole grains. Drink plenty of fluids to keep your urine clear or pale yellow.  Take warm sitz baths to soothe any pain or discomfort caused by hemorrhoids. Use hemorrhoid cream if your health care provider approves.  If you develop varicose veins, wear support hose. Elevate your feet for 15 minutes, 3-4 times a day. Limit salt in your diet.  Avoid heavy lifting, wear low heel shoes, and practice good posture.  Rest with your legs elevated if you have leg cramps or low back pain.  Visit your dentist if you have not gone yet during your pregnancy. Use a soft toothbrush to brush your teeth and be gentle when you floss.  A sexual relationship may be continued unless your health care provider directs you otherwise.  Continue to go to all your prenatal visits as directed by your health care provider. SEEK MEDICAL CARE IF:   You have dizziness.  You have mild pelvic cramps, pelvic pressure, or nagging pain in the abdominal area.  You have persistent nausea,  vomiting, or diarrhea.  You have a bad smelling vaginal discharge.  You have pain with urination. SEEK IMMEDIATE MEDICAL CARE IF:   You have a fever.  You are leaking fluid from your vagina.  You have spotting or bleeding from your vagina.  You have severe abdominal cramping or pain.  You have rapid weight gain or loss.  You have shortness of breath with chest pain.  You notice sudden or extreme swelling of your face, hands, ankles, feet, or legs.  You have not felt your baby move in over an hour.  You have severe headaches that do not go away with medicine.  You have vision changes.   This information is not intended to replace advice given to you by your health care provider. Make sure you discuss any questions you have with your health care provider.   Document Released: 11/05/2001 Document Revised: 12/02/2014 Document Reviewed: 01/12/2013 Elsevier Interactive Patient Education 2016 [employer]. [place] trimestre de Psychiatrist (Second Trimester of Pregnancy) El segundo trimestre va desde la semana13 hasta la 28, desde el cuarto [place]  el sexto mes, y suele ser el momento en el que mejor se siente. Su organismo se ha adaptado a Charity fundraiser y comienza a Diplomatic Services operational officer. En general, las nuseas matutinas han disminuido o han desaparecido completamente, puede tener ms energa y un aumento de apetito. El segundo trimestre es tambin la poca en la que el feto se desarrolla rpidamente. Hacia el final del sexto mes, el feto mide aproximadamente 9pulgadas (23cm) y pesa alrededor de 1 libras (700g). Es probable que sienta que el beb se Teacher, English as a foreign language (da pataditas) entre las 18 y 20semanas del Psychiatrist. CAMBIOS EN EL ORGANISMO Su organismo atraviesa por muchos cambios durante el [place], y estos varan de Neomia Dear mujer a Educational psychologist.   Seguir [employer]. Notar que la parte baja del abdomen sobresale.  Podrn aparecer las primeras [employer] caderas, el abdomen y  las Thurman.  Es posible que tenga dolores de cabeza que pueden aliviarse con los medicamentos que el mdico le permita tomar.  Tal vez tenga necesidad de orinar con ms frecuencia porque el feto est ejerciendo presin Ambulance person.  Debido al Vanetta Mulders podr sentir Anthoney Harada estomacal con frecuencia.  Puede estar estreida, ya que ciertas hormonas enlentecen los movimientos de los msculos que [employer] desechos a travs de los intestinos.  Pueden aparecer hemorroides o abultarse e hincharse las venas (venas varicosas).  Puede tener dolor de espalda que se debe al [employer] de peso y a que las hormonas del Management consultant las articulaciones entre los huesos de la pelvis, y Public librarian consecuencia de la modificacin del peso y los msculos que mantienen el equilibrio.  Las [employer] seguirn creciendo y Development worker, community.  Las Veterinary surgeon y estar sensibles al cepillado y al hilo dental.  Pueden aparecer zonas oscuras o manchas (cloasma, mscara del Psychiatrist) en el rostro que probablemente se atenuar despus del nacimiento del beb.  Es posible que se forme una lnea oscura desde el ombligo hasta la zona del pubis (linea nigra) que probablemente se atenuar despus del nacimiento del beb.  Tal vez haya cambios en el cabello que pueden incluir su engrosamiento, crecimiento rpido y cambios en la textura. Adems, a algunas mujeres se les cae el cabello durante o despus del embarazo, o tienen el cabello seco o fino. Lo ms probable es que el cabello se le normalice despus del nacimiento del beb. QU DEBE ESPERAR EN LAS CONSULTAS PRENATALES Durante una visita prenatal de rutina:  La pesarn para asegurarse de que usted y el feto estn creciendo normalmente.  Le tomarn la presin arterial.  Le medirn el abdomen para controlar el desarrollo del beb.  Se escucharn los latidos cardacos fetales.  Se evaluarn los resultados de los estudios solicitados en visitas anteriores. El mdico puede  preguntarle lo siguiente:  Cmo se siente.  Si siente los movimientos del beb.  Si ha tenido sntomas anormales, como prdida de lquido, [place], dolores de cabeza intensos o clicos abdominales.  Si est consumiendo algn producto que contenga tabaco, como cigarrillos, tabaco de Theatre manager y Administrator, Civil Service.  Si tiene [employer]. Otros estudios que podrn realizarse durante el segundo trimestre incluyen lo siguiente:  Anlisis de sangre para detectar lo siguiente:  Concentraciones de hierro bajas (anemia).  Diabetes gestacional (entre la semana 24 y la 28).  Anticuerpos Rh.  Anlisis de orina para detectar infecciones, diabetes o protenas en la orina.  Una ecografa para confirmar que el beb crece y se desarrolla correctamente.  Una amniocentesis para diagnosticar posibles problemas  genticos.  Estudios del feto para descartar espina bfida y sndrome de Down.  Prueba del VIH (virus de inmunodeficiencia humana). Los exmenes prenatales de rutina incluyen la prueba de deteccin del VIH, a menos que decida no Futures trader. INSTRUCCIONES PARA EL CUIDADO EN EL HOGAR   Evite fumar, consumir hierbas, beber alcohol y tomar frmacos que no le hayan recetado. Estas sustancias qumicas afectan la formacin y el desarrollo del beb.  No consuma ningn producto que contenga tabaco, lo que incluye cigarrillos, tabaco de Theatre manager y Administrator, Civil Service. Si necesita ayuda para dejar de fumar, consulte al [employer]. Puede recibir asesoramiento y otro tipo de recursos para dejar de fumar.  Siga las indicaciones del mdico en relacin con el uso de medicamentos. Durante el embarazo, hay medicamentos que son seguros de tomar y otros que no.  Haga ejercicio solamente como se lo haya indicado el mdico. Sentir clicos uterinos es un buen signo para Restaurant manager, fast food actividad fsica.  Contine comiendo alimentos sanos con regularidad.  Use un sostn que le brinde buen soporte si le  [employer].  No se d baos de inmersin en agua caliente, baos turcos ni saunas.  Use el cinturn de seguridad en todo momento mientras conduce.  No coma carne cruda ni queso sin cocinar; evite el contacto con las bandejas sanitarias de los gatos y la tierra que estos animales usan. Estos elementos contienen grmenes que pueden causar defectos congnitos en el beb.  Tome las vitaminas prenatales.  Tome entre 1500 y 2000mg  de calcio diariamente comenzando en la semana20 del embarazo [place].  Si est estreida, pruebe un laxante suave (si el mdico lo autoriza). Consuma ms alimentos ricos en fibra, como vegetales y frutas frescos y Radiation protection practitioner. Beba gran cantidad de lquido para mantener la orina de tono claro o color amarillo plido.  Dese baos de asiento con agua tibia para Engineer, materials o las molestias causadas por las hemorroides. Use una crema para las hemorroides si el mdico la autoriza.  Si tiene venas varicosas, use medias de descanso. Eleve los pies durante , 3 o 4veces por da. Limite el consumo de sal en su dieta.  No levante objetos pesados, use zapatos de tacones bajos y [address].  Descanse con las piernas elevadas si tiene calambres o dolor de cintura.  Visite a su dentista si an no lo ha Occupational hygienist. Use un cepillo de dientes blando para higienizarse los dientes y psese el hilo dental con suavidad.  Puede seguir [employer], a menos que el mdico le indique lo contrario.  Concurra a todas las visitas prenatales segn las indicaciones de su mdico. SOLICITE ATENCIN MDICA SI:   [place].  Siente clicos leves, presin en la pelvis o dolor persistente en el abdomen.  Tiene nuseas, vmitos o diarrea persistentes.  Brett Fairy secrecin vaginal con mal olor.  Siente dolor al [employer]. SOLICITE ATENCIN MDICA DE INMEDIATO SI:   Tiene fiebre.  Tiene una prdida de  lquido por la vagina.  Tiene sangrado o pequeas prdidas vaginales.  Siente dolor intenso o clicos en el abdomen.  Sube o baja de peso rpidamente.  Tiene dificultad para respirar y siente dolor de pecho.  Sbitamente se le hinchan mucho el rostro, las [place], los tobillos, los pies o las piernas.  No ha sentido los movimientos del beb durante Georgianne Fick.  Siente un dolor de cabeza intenso que no se alivia con medicamentos.  Su visin se modifica.  Esta informacin no tiene Theme park manager el consejo del mdico. Asegrese de hacerle al mdico cualquier pregunta que tenga.   Document Released: 08/21/2005 Document Revised: 12/02/2014 Elsevier Interactive Patient Education [employer]

## 2015-12-26 NOTE — Progress Notes (Signed)
Subjective:  Maria Daniel is a 35 y.o. [phone] at [redacted]w[redacted]d being seen today for ongoing prenatal care.  She is currently monitored for the following issues for this low-risk pregnancy and has History of C-section; Borderline hypertension; and Supervision of normal subsequent pregnancy on her problem list.  Patient reports no complaints.  Contractions: Not present. Vag. Bleeding: None.  Movement: Present. Denies leaking of fluid.   The following portions of the patient's history were reviewed and updated as appropriate: allergies, current medications, past family history, past medical history, past social history, past surgical history and problem list. Problem list updated.  Objective:   Filed Vitals:   12/26/15 1539  BP: 114/62  Pulse: 97  Temp: 98.4 F (36.9 C)  Weight: 246 lb 6.4 oz (111.766 kg)    Fetal Status: Fetal Heart Rate (bpm): 151   Movement: Present     General:  Alert, oriented and cooperative. Patient is in no acute distress.  Skin: Skin is warm and dry. No rash noted.   Cardiovascular: Normal heart rate noted  Respiratory: Normal respiratory effort, no problems with respiration noted  Abdomen: Soft, gravid, appropriate for gestational age. Pain/Pressure: Present     Pelvic: Vag. Bleeding: None     Cervical exam deferred        Extremities: Normal range of motion.  Edema: None  Mental Status: Normal mood and affect. Normal behavior. Normal judgment and thought content.   Urinalysis: Urine Protein: 1+ Urine Glucose: Negative  Assessment and Plan:  Pregnancy: [id number] at [redacted]w[redacted]d  1. Encounter for supervision of other normal pregnancy in second trimester    Doing well  2. History of C-section       Preterm labor symptoms and general obstetric precautions including but not limited to vaginal bleeding, contractions, leaking of fluid and fetal movement were reviewed in detail with the patient. Please refer to After Visit Summary for other counseling  recommendations.   RTC 4 weeks Wants to space out visits due to costs. Encouraged her to revisit application for discount. May go 5 weeks for next visit  Aviva Signs, CNM

## 2015-12-26 NOTE — Progress Notes (Signed)
Spanish interpreter Marly Adams  

## 2016-01-18 ENCOUNTER — Telehealth: Payer: Self-pay

## 2016-01-18 NOTE — Telephone Encounter (Signed)
Pt called and stated that she has been using a nasal spray that is not effective because it is hard for her to breath is there something else she can take?   Pt called with Spanish interpreter, Lorinda Creed, and pt informed me that she is having congestion, dry mouth, cough.  Pt also states that she has been using Afrin nasal spray at least q 2 hours.  I explained to pt that it sounds like she has allergies to try to use Zyrtec or Claritin once daily and to also get Sudafed otc.  I advised to take those recommended medicines and that she will be evaluated at her next visit on 01/25/16.  Pt agreed with no further questions.

## 2016-01-25 ENCOUNTER — Ambulatory Visit (INDEPENDENT_AMBULATORY_CARE_PROVIDER_SITE_OTHER): Payer: Self-pay | Admitting: Advanced Practice Midwife

## 2016-01-25 VITALS — BP 133/66 | HR 85 | Wt 251.1 lb

## 2016-01-25 DIAGNOSIS — Z98891 History of uterine scar from previous surgery: Secondary | ICD-10-CM

## 2016-01-25 DIAGNOSIS — Z23 Encounter for immunization: Secondary | ICD-10-CM

## 2016-01-25 DIAGNOSIS — Z3482 Encounter for supervision of other normal pregnancy, second trimester: Secondary | ICD-10-CM

## 2016-01-25 LAB — POCT URINALYSIS DIP (DEVICE)
BILIRUBIN URINE: NEGATIVE
Glucose, UA: NEGATIVE mg/dL
HGB URINE DIPSTICK: NEGATIVE
KETONES UR: NEGATIVE mg/dL
LEUKOCYTES UA: NEGATIVE
NITRITE: NEGATIVE
PH: 6 (ref 5.0–8.0)
Protein, ur: 30 mg/dL — AB
Urobilinogen, UA: 1 mg/dL (ref 0.0–1.0)

## 2016-01-25 NOTE — Patient Instructions (Addendum)
Alergias °(Allergies) °Una alergia es una reacción anormal del sistema de defensa del cuerpo (sistema inmunitario) ante una sustancia. Las alergias pueden aparecer a cualquier edad. °¿CUÁLES SON LAS CAUSAS DE LAS ALERGIAS? °La reacción alérgica se produce cuando el sistema inmunitario, por equivocación, reacciona ante una sustancia normalmente inocua, llamada alérgeno, como si fuera perjudicial. El sistema inmunitario libera anticuerpos para combatir la sustancia. Con el tiempo, los anticuerpos liberan una sustancia química llamada histamina en el torrente sanguíneo. La liberación de histamina tiene como fin proteger al cuerpo de la infección, pero también causa molestias. °Cualquiera de estas acciones puede desencadenar una reacción alérgica: °· Comer un alérgeno. °· Inhalar un alérgeno. °· Tocar un alérgeno. °¿CUÁLES SON LAS CLASES DE ALERGIAS? °Hay muchas clases de alergias. Entre las más frecuentes, se incluyen las siguientes: °· Alergias estacionales. Por lo general, esta clase de alergia se produce por sustancias que solo están presentes durante determinadas estaciones, por ejemplo, el moho y el polen. °· Alergias a los alimentos. °· Alergias a los medicamentos. °· Alergias a los insectos. °· Alergias a la caspa de los animales. °¿CUÁLES SON LOS SÍNTOMAS DE LAS ALERGIAS? °Entre los posibles síntomas de la alergia, se incluyen los siguientes: °· Hinchazón de los labios, la cara, la lengua, la boca o la garganta. °· Estornudos, tos o sibilancias. °· Congestión nasal. °· Hormigueo en la boca. °· Erupción cutánea. °· Picazón. °· Zonas de piel hinchadas, rojas y que producen picazón (ronchas). °· Lagrimeo. °· Vómitos. °· Diarrea. °· Mareos. °· Sensación de desvanecimiento. °· Desmayos. °· Problemas para respirar o tragar. °· Opresión en el pecho. °· Latidos cardíacos rápidos. °¿CÓMO SE DIAGNOSTICAN LAS ALERGIAS? °Las alergias se diagnostican en función de los antecedentes médicos y familiares, y mediante uno o más  de estos elementos: °· Pruebas cutáneas. °· Análisis de sangre. °· Un registro de alimentos. Un registro de alimentos incluye todos los alimentos y las bebidas que usted consume en un día, y todos los síntomas que experimenta. °· Los resultados de una dieta de eliminación. Una dieta de eliminación implica eliminar alimentos de la dieta y luego incorporarlos nuevamente, uno a la vez, para averiguar si hay uno en particular que le cause una reacción alérgica. °¿CÓMO SE TRATAN LAS ALERGIAS? °No hay una cura para las alergias, pero las reacciones alérgicas pueden tratarse con medicamentos. Generalmente, las reacciones graves deben tratarse en un hospital. °¿CÓMO PUEDEN PREVENIRSE LAS REACCIONES? °La mejor manera de prevenir una reacción alérgica es evitar la sustancia que le causa alergia. Las vacunas y los medicamentos para la alergia también pueden ayudar a prevenir las reacciones en algunos casos. Las personas con reacciones alérgicas graves pueden prevenir una reacción potencialmente mortal llamada anafilaxis con la administración inmediata de un medicamento después de la exposición al alérgeno. °  °Esta información no tiene como fin reemplazar el consejo del médico. Asegúrese de hacerle al médico cualquier pregunta que tenga. °  °Document Released: 11/11/2005 Document Revised: 12/02/2014 °Elsevier Interactive Patient Education ©2016 Elsevier Inc. ° °

## 2016-01-25 NOTE — Progress Notes (Signed)
Subjective:  Maria Daniel is a 35 y.o. [phone] at [redacted]w[redacted]d being seen today for ongoing prenatal care.  She is currently monitored for the following issues for this low-risk pregnancy and has History of C-section; Borderline hypertension; and Supervision of normal subsequent pregnancy on her problem list.  Patient reports brown spotting two days ago and this morning, no cramps.  Denies recent IC.  ALso c/o nasal congestion x 15 days.   Using Afrin and sudafed. Tried Claritin but stopped it.    Contractions: Not present. Vag. Bleeding: None.  Movement: Present. Denies leaking of fluid.   The following portions of the patient's history were reviewed and updated as appropriate: allergies, current medications, past family history, past medical history, past social history, past surgical history and problem list. Problem list updated.  Objective:   Filed Vitals:   01/25/16 1537  BP: 133/66  Pulse: 85  Weight: 251 lb 1.6 oz (113.898 kg)    Fetal Status: Fetal Heart Rate (bpm): 140   Movement: Present     General:  Alert, oriented and cooperative. Patient is in no acute distress.  Skin: Skin is warm and dry. No rash noted.   Cardiovascular: Normal heart rate noted  Respiratory: Normal respiratory effort, no problems with respiration noted  Abdomen: Soft, gravid, appropriate for gestational age. Pain/Pressure: Present     Pelvic: Vag. Bleeding: None     Cervical exam performed        Speculum exam done. No blood in vault. Cervix long and closed.  Extremities: Normal range of motion.  Edema: None  Mental Status: Normal mood and affect. Normal behavior. Normal judgment and thought content.   Urinalysis: Urine Protein: 1+ Urine Glucose: Negative  Assessment and Plan:  Pregnancy: [id number] at [redacted]w[redacted]d  1. History of C-section       - POCT urinalysis dip (device)  2. Needs flu shot    Given today - Flu Vaccine QUAD 36+ mos IM (Fluarix, Quad PF)  3. Encounter for supervision of other  normal pregnancy in second trimester      Glucola next visit  Preterm labor symptoms and general obstetric precautions including but not limited to vaginal bleeding, contractions, leaking of fluid and fetal movement were reviewed in detail with the patient. Please refer to After Visit Summary for other counseling recommendations.  Return in about 3 weeks (around 02/15/2016) for [facility].   Aviva Signs, CNM

## 2016-02-15 ENCOUNTER — Encounter: Payer: Self-pay | Admitting: Certified Nurse Midwife

## 2016-02-20 ENCOUNTER — Ambulatory Visit (INDEPENDENT_AMBULATORY_CARE_PROVIDER_SITE_OTHER): Payer: Self-pay | Admitting: Family Medicine

## 2016-02-20 VITALS — BP 117/75 | HR 102 | Temp 98.5°F | Wt 253.0 lb

## 2016-02-20 DIAGNOSIS — O09293 Supervision of pregnancy with other poor reproductive or obstetric history, third trimester: Secondary | ICD-10-CM

## 2016-02-20 DIAGNOSIS — Z23 Encounter for immunization: Secondary | ICD-10-CM

## 2016-02-20 DIAGNOSIS — E669 Obesity, unspecified: Secondary | ICD-10-CM

## 2016-02-20 DIAGNOSIS — O99213 Obesity complicating pregnancy, third trimester: Secondary | ICD-10-CM

## 2016-02-20 DIAGNOSIS — Z98891 History of uterine scar from previous surgery: Secondary | ICD-10-CM

## 2016-02-20 DIAGNOSIS — Z3483 Encounter for supervision of other normal pregnancy, third trimester: Secondary | ICD-10-CM

## 2016-02-20 DIAGNOSIS — Z8632 Personal history of gestational diabetes: Secondary | ICD-10-CM

## 2016-02-20 LAB — CBC
HEMATOCRIT: 33.5 % — AB (ref 36.0–46.0)
Hemoglobin: 10.8 g/dL — ABNORMAL LOW (ref 12.0–15.0)
MCH: 22.7 pg — ABNORMAL LOW (ref 26.0–34.0)
MCHC: 32.2 g/dL (ref 30.0–36.0)
MCV: 70.4 fL — AB (ref 78.0–100.0)
MPV: 9.1 fL (ref 8.6–12.4)
Platelets: 396 10*3/uL (ref 150–400)
RBC: 4.76 MIL/uL (ref 3.87–5.11)
RDW: 15.3 % (ref 11.5–15.5)
WBC: 9.9 10*3/uL (ref 4.0–10.5)

## 2016-02-20 LAB — POCT URINALYSIS DIP (DEVICE)
Glucose, UA: NEGATIVE mg/dL
Hgb urine dipstick: NEGATIVE
Ketones, ur: 15 mg/dL — AB
Nitrite: NEGATIVE
Protein, ur: 100 mg/dL — AB
Specific Gravity, Urine: 1.025 (ref 1.005–1.030)
Urobilinogen, UA: 2 mg/dL — ABNORMAL HIGH (ref 0.0–1.0)
pH: 6 (ref 5.0–8.0)

## 2016-02-20 MED ORDER — TETANUS-DIPHTH-ACELL PERTUSSIS 5-2.5-18.5 LF-MCG/0.5 IM SUSP
0.5000 mL | Freq: Once | INTRAMUSCULAR | Status: AC
  Administered 2016-02-20: 0.5 mL via INTRAMUSCULAR

## 2016-02-20 NOTE — Progress Notes (Signed)
Pt reports increased swelling of feet and pain in Lt hip.  1 Hr GTT today.  [employer] video interpreter Gillis SantaGabriela [phone] used for encounter.

## 2016-02-20 NOTE — Patient Instructions (Addendum)
Leg Cramps: Tums MagGel   Third Trimester of Pregnancy The third trimester is from week 29 through week 42, months 7 through 9. The third trimester is a time when the fetus is growing rapidly. At the end of the ninth month, the fetus is about 20 inches in length and weighs 6-10 pounds.  BODY CHANGES Your body goes through many changes during pregnancy. The changes vary from woman to woman.   Your weight will continue to increase. You can expect to gain 25-35 pounds (11-16 kg) by the end of the pregnancy.  You may begin to get stretch marks on your hips, abdomen, and breasts.  You may urinate more often because the fetus is moving lower into your pelvis and pressing on your bladder.  You may develop or continue to have heartburn as a result of your pregnancy.  You may develop constipation because certain hormones are causing the muscles that push waste through your intestines to slow down.  You may develop hemorrhoids or swollen, bulging veins (varicose veins).  You may have pelvic pain because of the weight gain and pregnancy hormones relaxing your joints between the bones in your pelvis. Backaches may result from overexertion of the muscles supporting your posture.  You may have changes in your hair. These can include thickening of your hair, rapid growth, and changes in texture. Some women also have hair loss during or after pregnancy, or hair that feels dry or thin. Your hair will most likely return to normal after your baby is born.  Your breasts will continue to grow and be tender. A yellow discharge may leak from your breasts called colostrum.  Your belly button may stick out.  You may feel short of breath because of your expanding uterus.  You may notice the fetus "dropping," or moving lower in your abdomen.  You may have a bloody mucus discharge. This usually occurs a few days to a week before labor begins.  Your cervix becomes thin and soft (effaced) near your due  date. WHAT TO EXPECT AT YOUR PRENATAL EXAMS  You will have prenatal exams every 2 weeks until week 36. Then, you will have weekly prenatal exams. During a routine prenatal visit:  You will be weighed to make sure you and the fetus are growing normally.  Your blood pressure is taken.  Your abdomen will be measured to track your baby's growth.  The fetal heartbeat will be listened to.  Any test results from the previous visit will be discussed.  You may have a cervical check near your due date to see if you have effaced. At around 36 weeks, your caregiver will check your cervix. At the same time, your caregiver will also perform a test on the secretions of the vaginal tissue. This test is to determine if a type of bacteria, Group B streptococcus, is present. Your caregiver will explain this further. Your caregiver may ask you:  What your birth plan is.  How you are feeling.  If you are feeling the baby move.  If you have had any abnormal symptoms, such as leaking fluid, bleeding, severe headaches, or abdominal cramping.  If you are using any tobacco products, including cigarettes, chewing tobacco, and electronic cigarettes.  If you have any questions. Other tests or screenings that may be performed during your third trimester include:  Blood tests that check for low iron levels (anemia).  Fetal testing to check the health, activity level, and growth of the fetus. Testing is done if you  have certain medical conditions or if there are problems during the pregnancy.  HIV (human immunodeficiency virus) testing. If you are at high risk, you may be screened for HIV during your third trimester of pregnancy. FALSE LABOR You may feel small, irregular contractions that eventually go away. These are called Braxton Hicks contractions, or false labor. Contractions may last for hours, days, or even weeks before true labor sets in. If contractions come at regular intervals, intensify, or become  painful, it is best to be seen by your caregiver.  SIGNS OF LABOR   Menstrual-like cramps.  Contractions that are 5 minutes apart or less.  Contractions that start on the top of the uterus and spread down to the lower abdomen and back.  A sense of increased pelvic pressure or back pain.  A watery or bloody mucus discharge that comes from the vagina. If you have any of these signs before the 37th week of pregnancy, call your caregiver right away. You need to go to the hospital to get checked immediately. HOME CARE INSTRUCTIONS   Avoid all smoking, herbs, alcohol, and unprescribed drugs. These chemicals affect the formation and growth of the baby.  Do not use any tobacco products, including cigarettes, chewing tobacco, and electronic cigarettes. If you need help quitting, ask your health care provider. You may receive counseling support and other resources to help you quit.  Follow your caregiver's instructions regarding medicine use. There are medicines that are either safe or unsafe to take during pregnancy.  Exercise only as directed by your caregiver. Experiencing uterine cramps is a good sign to stop exercising.  Continue to eat regular, healthy meals.  Wear a good support bra for breast tenderness.  Do not use hot tubs, steam rooms, or saunas.  Wear your seat belt at all times when driving.  Avoid raw meat, uncooked cheese, cat litter boxes, and soil used by cats. These carry germs that can cause birth defects in the baby.  Take your prenatal vitamins.  Take 1500-2000 mg of calcium daily starting at the 20th week of pregnancy until you deliver your baby.  Try taking a stool softener (if your caregiver approves) if you develop constipation. Eat more high-fiber foods, such as fresh vegetables or fruit and whole grains. Drink plenty of fluids to keep your urine clear or pale yellow.  Take warm sitz baths to soothe any pain or discomfort caused by hemorrhoids. Use hemorrhoid  cream if your caregiver approves.  If you develop varicose veins, wear support hose. Elevate your feet for 15 minutes, 3-4 times a day. Limit salt in your diet.  Avoid heavy lifting, wear low heal shoes, and practice good posture.  Rest a lot with your legs elevated if you have leg cramps or low back pain.  Visit your dentist if you have not gone during your pregnancy. Use a soft toothbrush to brush your teeth and be gentle when you floss.  A sexual relationship may be continued unless your caregiver directs you otherwise.  Do not travel far distances unless it is absolutely necessary and only with the approval of your caregiver.  Take prenatal classes to understand, practice, and ask questions about the labor and delivery.  Make a trial run to the hospital.  Pack your hospital bag.  Prepare the baby's nursery.  Continue to go to all your prenatal visits as directed by your caregiver. SEEK MEDICAL CARE IF:  You are unsure if you are in labor or if your water has broken.  You have dizziness.  You have mild pelvic cramps, pelvic pressure, or nagging pain in your abdominal area.  You have persistent nausea, vomiting, or diarrhea.  You have a bad smelling vaginal discharge.  You have pain with urination. SEEK IMMEDIATE MEDICAL CARE IF:   You have a fever.  You are leaking fluid from your vagina.  You have spotting or bleeding from your vagina.  You have severe abdominal cramping or pain.  You have rapid weight loss or gain.  You have shortness of breath with chest pain.  You notice sudden or extreme swelling of your face, hands, ankles, feet, or legs.  You have not felt your baby move in over an hour.  You have severe headaches that do not go away with medicine.  You have vision changes.   This information is not intended to replace advice given to you by your health care provider. Make sure you discuss any questions you have with your health care provider.    Document Released: 11/05/2001 Document Revised: 12/02/2014 Document Reviewed: 01/12/2013 Elsevier Interactive Patient Education [employer]2016 [employer]

## 2016-02-20 NOTE — Progress Notes (Signed)
Subjective:  Maria Daniel is a 35 y.o. [phone] at 7855w3d being seen today for ongoing prenatal care.  She is currently monitored for the following issues for this low-risk pregnancy and has History of C-section; Borderline hypertension; Supervision of normal subsequent pregnancy; History of gestational diabetes in prior pregnancy, currently pregnant in third trimester; and Obesity affecting pregnancy in third trimester, antepartum on her problem list.  Patient reports pain waist, legs, feet.  Contractions: Not present. Vag. Bleeding: None.  Movement: Present. Denies leaking of fluid.   The following portions of the patient's history were reviewed and updated as appropriate: allergies, current medications, past family history, past medical history, past social history, past surgical history and problem list. Problem list updated.  Objective:   Filed Vitals:   02/20/16 1509  BP: 117/75  Pulse: 102  Temp: 98.5 F (36.9 C)  Weight: 253 lb (114.76 kg)    Fetal Status: Fetal Heart Rate (bpm): 140 Fundal Height: 31 cm Movement: Present     General:  Alert, oriented and cooperative. Patient is in no acute distress.  Skin: Skin is warm and dry. No rash noted.   Cardiovascular: Normal heart rate noted  Respiratory: Normal respiratory effort, no problems with respiration noted  Abdomen: Soft, gravid, appropriate for gestational age. Pain/Pressure: Present     Pelvic: Vag. Bleeding: None     Cervical exam deferred        Extremities: Normal range of motion.  Edema: Mild pitting, slight indentation  Mental Status: Normal mood and affect. Normal behavior. Normal judgment and thought content.   Urinalysis: Urine Protein: 2+ Urine Glucose: Negative  Assessment and Plan:  Pregnancy: [id number] at 5055w3d  1. Encounter for supervision of other normal pregnancy in third trimester - added  - Glucose Tolerance, 1 HR (50g) - RPR - HIV antibody - CBC  2. History of C-sectiona Lengthy  discussion about due date today. I have reiterated with the patient that her due date is 6/17 and that waiting until 6/23 would be unsafe. She insists her due date cannot be 6/17 because she was using natural family planning and knows exactly when she delivered.   3. History of gestational diabetes in prior pregnancy, currently pregnant in third trimester  4. Obesity affecting pregnancy in third trimester, antepartum   Preterm labor symptoms and general obstetric precautions including but not limited to vaginal bleeding, contractions, leaking of fluid and fetal movement were reviewed in detail with the patient. Please refer to After Visit Summary for other counseling recommendations.   Return in about 3 weeks (around 03/12/2016) for Routine prenatal care.   Federico FlakeKimberly Niles Race Latour, MD

## 2016-02-21 ENCOUNTER — Telehealth: Payer: Self-pay | Admitting: General Practice

## 2016-02-21 LAB — HIV ANTIBODY (ROUTINE TESTING W REFLEX): HIV 1&2 Ab, 4th Generation: NONREACTIVE

## 2016-02-21 LAB — RPR

## 2016-02-21 LAB — GLUCOSE TOLERANCE, 1 HOUR (50G) W/O FASTING: GLUCOSE, 1 HR, GESTATIONAL: 224 mg/dL — AB (ref <140)

## 2016-02-21 NOTE — Telephone Encounter (Signed)
I have spoken with patient using Spanish interpreter. Pt has agree to come in for appointment to meet with Diabetes Educator.

## 2016-02-21 NOTE — Telephone Encounter (Signed)
Per Dr Alvester MorinNewton, patient failed 1 hr gtt of 224. Needs to come in asap for diabetes ed and nutrition. Called patient with pacific interpreter [phone], no answer- left message stating we are trying to reach you with results, please call us back at the clinics

## 2016-02-26 ENCOUNTER — Ambulatory Visit: Payer: Self-pay | Admitting: *Deleted

## 2016-02-26 ENCOUNTER — Encounter: Payer: Self-pay | Attending: Family Medicine | Admitting: *Deleted

## 2016-02-26 DIAGNOSIS — Z029 Encounter for administrative examinations, unspecified: Secondary | ICD-10-CM | POA: Insufficient documentation

## 2016-02-26 DIAGNOSIS — O24419 Gestational diabetes mellitus in pregnancy, unspecified control: Secondary | ICD-10-CM

## 2016-02-26 NOTE — Progress Notes (Signed)
  Patient was seen on 02/26/16 for Gestational Diabetes self-management . Patient has history of insulin need with prior pregnancy. She is unable to make visits on Monday. She does not drive and husband works until 3:30. I have asked her to call the office Thursday to report her glucose readings for evaluation of medication needs.  . The following learning objectives were met by the patient :   States the definition of Gestational Diabetes  States when to check blood glucose levels  Demonstrates proper blood glucose monitoring techniques  States the effect of stress and exercise on blood glucose levels  States the importance of limiting caffeine and abstaining from alcohol and smoking  Plan:  Consider  increasing your activity level by walking daily as tolerated Begin checking BG before breakfast and 2 hours after first bit of breakfast, lunch and dinner after  as directed by MD  Take medication  as directed by MD  Blood glucose monitor given: [employer] Lot # [id number] Exp: 2018-02-06 Blood glucose reading:  FBS '131mg'$ /dl  Patient instructed to monitor glucose levels: FBS: 60 - <90 2 hour: <120  Patient received the following handouts:  Nutrition Diabetes and Pregnancy  Carbohydrate Counting List  Meal Planning worksheet  Patient will be seen for follow-up as needed.

## 2016-03-04 ENCOUNTER — Telehealth: Payer: Self-pay | Admitting: *Deleted

## 2016-03-04 DIAGNOSIS — O24410 Gestational diabetes mellitus in pregnancy, diet controlled: Secondary | ICD-10-CM

## 2016-03-04 MED ORDER — GLYBURIDE 2.5 MG PO TABS
2.5000 mg | ORAL_TABLET | Freq: Two times a day (BID) | ORAL | Status: DC
Start: 2016-03-04 — End: 2016-03-12

## 2016-03-04 NOTE — Telephone Encounter (Signed)
Pt called to give blood sugar readings as directed by Harriett SineNancy.  Monday 02/26/16: f131, 288, 282 Tuesday 02/27/16: f-145, 295, 298, 215 Wednesday 02/28/16: f-136, 220, 270, 280 Thursday 02/29/16:f:118, 222, 265, 312 Friday: 03/01/16: F129, 219, 297, 299 Saturday 03/02/16: F129, 224, 288, 312 Sunday 03/03/16: F145, 199, 210, 300 Monday 03/04/16: 136  Dr. Macon LargeAnyanwu reviewed sugars and advised patient to start Glyburide 2.5 mg bid. She should return on Monday for review of blood sugars.

## 2016-03-04 NOTE — Telephone Encounter (Signed)
Pt informed of advice and new rx. Pt unable to come on Monday so she will bring reading to visit on Tuesday.

## 2016-03-12 ENCOUNTER — Ambulatory Visit (INDEPENDENT_AMBULATORY_CARE_PROVIDER_SITE_OTHER): Payer: Self-pay | Admitting: Student

## 2016-03-12 VITALS — BP 132/70 | HR 84 | Wt 253.5 lb

## 2016-03-12 DIAGNOSIS — O24415 Gestational diabetes mellitus in pregnancy, controlled by oral hypoglycemic drugs: Secondary | ICD-10-CM

## 2016-03-12 DIAGNOSIS — Z3483 Encounter for supervision of other normal pregnancy, third trimester: Secondary | ICD-10-CM

## 2016-03-12 LAB — POCT URINALYSIS DIP (DEVICE)
GLUCOSE, UA: 500 mg/dL — AB
HGB URINE DIPSTICK: NEGATIVE
Ketones, ur: 15 mg/dL — AB
NITRITE: NEGATIVE
Protein, ur: 30 mg/dL — AB
Specific Gravity, Urine: >=1.03 (ref 1.005–1.030)
UROBILINOGEN UA: 4 mg/dL — AB (ref 0.0–1.0)
pH: 6 (ref 5.0–8.0)

## 2016-03-12 MED ORDER — GLYBURIDE 5 MG PO TABS: 5.0000 mg | ORAL_TABLET | Freq: Two times a day (BID) | ORAL | Status: DC

## 2016-03-12 NOTE — Patient Instructions (Addendum)
Informacin sobre Government social research officer  (Preterm Labor Information) El parto prematuro comienza antes de la semana 37 de [place]. La duracin de un embarazo normal es de 39 a 41 semanas.  CAUSAS  Generalmente no hay una causa que pueda identificarse del motivo por el que una mujer comienza un trabajo de parto prematuro. Sin embargo, una de las causas conocidas ms frecuentes son las infecciones. Las infecciones del tero, el cuello, la vagina, el lquido [place], la vejiga, los riones y Teacher, adult education de los pulmones (neumona) pueden hacer que el trabajo de parto se inicie. Otras causas que pueden sospecharse son:   Infecciones urogenitales, como infecciones por hongos y vaginosis bacteriana.   Anormalidades uterinas (forma del tero, sptum uterino, fibromas, hemorragias en la placenta).   Un cuello que ha sido operado (puede ser que no permanezca cerrado).   Malformaciones del feto.   Gestaciones mltiples (mellizos, trillizos y ms).   Ruptura del saco amnitico.  FACTORES DE RIESGO   Historia previa de parto prematuro.   Tener ruptura prematura de las membranas (RPM).   La placenta cubre la abertura del cuello (placenta previa).   La placenta se separa del tero (abrupcin placentaria).   El cuello es demasiado dbil para contener al beb en el tero (cuello incompetente).   Hay mucho lquido en el saco amnitico (polihidramnios).   Consumo de drogas o hbito de fumar durante Firefighter.   No aumentar de peso lo suficiente durante el [employer].   Mujeres menores de 18 aos o mayores de [address] [place].   Nivel socioeconmico bajo.   Pertenecer a Engineer, production. SNTOMAS  Los signos y sntomas del trabajo de parto prematuro son:   Public librarian similares a los Designer, jewellery, dolor abdominal o dolor de espalda.  Contracciones uterinas regulares, tan frecuentes como seis por hora, sin importar su intensidad (pueden ser suaves o dolorosas).  Contracciones que comienzan  en la parte superior del tero y se expanden hacia abajo, a la zona inferior del abdomen y la espalda.   Sensacin de aumento de presin en la pelvis.   Aparece una secrecin acuosa o sanguinolenta por la vagina.  TRATAMIENTO  Segn el tiempo del embarazo y otras [place], el mdico puede indicar reposo en cama. Si es necesario, le indicarn medicamentos para TEFL teacher las contracciones y para Customer service manager los pulmones del feto. Si el trabajo de parto se inicia antes de las 34 semanas de [place], se recomienda la hospitalizacin. El tratamiento depende de las condiciones en que se encuentren usted y el feto.  QU DEBE HACER SI PIENSA QUE EST EN TRABAJO DE PARTO PREMATURO?  Comunquese con su mdico inmediatamente. Debe concurrir al hospital para ser controlada inmediatamente.  CMO PUEDE EVITAR EL TRABAJO DE PARTO PREMATURO EN FUTUROS EMBARAZOS?  Usted debe:   Si fuma, abandonar el hbito.  Mantener un peso saludable y evitar sustancias qumicas y drogas innecesarias.  Controlar todo tipo de infeccin.  Informe a su mdico si tiene una historia conocida de trabajo de parto prematuro.   Esta informacin no tiene Theme park manager el consejo del mdico. Asegrese de hacerle al mdico cualquier pregunta que tenga.   Document Released: 02/18/2008 Document Revised: 07/14/2013 Elsevier Interactive Patient Education 2016 [employer].  Diabetes mellitus gestacional (Gestational Diabetes Mellitus) La diabetes mellitus gestacional, ms comnmente conocida como diabetes gestacional es un tipo de diabetes que desarrollan algunas mujeres durante el [place]. En la diabetes gestacional, el pncreas no produce suficiente insulina (una hormona) o las clulas son menos sensibles a  la insulina producida (resistencia a la insulina), o ambas cosas. Normalmente, la Johnson Controlsinsulina mueve los azcares de los alimentos a las clulas de los tejidos. Las clulas de los tejidos Cendant Corporationutilizan los azcares para Environmental health practitionerobtener  energa. La falta de insulina o la falta de una respuesta normal a la insulina hace que el exceso de azcar se acumule en la sangre en lugar de Customer service managerpenetrar en las clulas de los tejidos. Como resultado, se producen niveles altos de Bankerazcar en la sangre (hiperglucemia). El [address] de los niveles altos de International aid/development workerazcar (glucosa) puede causar muchos problemas.  FACTORES DE RIESGO Usted tiene mayor probabilidad de desarrollar diabetes gestacional si tiene antecedentes familiares de diabetes y tambin si tiene uno o ms de los siguientes factores de riesgo:  ndice de masa corporal superior a 30 (obesidad).  Embarazo previo con diabetes gestacional.  La edad avanzada en el momento del embarazo. Si se mantienen los niveles de glucosa en la sangre en un rango normal durante el [place], las mujeres pueden tener un embarazo saludable. Si los niveles de glucosa en la sangre no estn bien controlados, puede haber riesgos para usted, el feto o el recin nacido, o durante el Stockvilletrabajo de parto y Franktonel parto.  SNTOMAS  Si se presentan sntomas, stos son similares a los sntomas que normalmente experimentar durante el embarazo. Los sntomas de la diabetes gestacional son:   Wyvonnia Duskyumento de la sed (polidipsia).  Aumento de la miccin (poliuria).  Orina con ms frecuencia durante la noche (nocturia).  Prdida de peso. La prdida de peso puede ser muy rpida.  Infecciones frecuentes y recurrentes.  Cansancio (fatiga).  Debilidad.  Cambios en la visin, como visin borrosa.  Olor a Water quality scientistfruta en el aliento.  Dolor abdominal. DIAGNSTICO La diabetes se diagnostica cuando hay aumento de los niveles de glucosa en la [place]. El nivel de glucosa en la sangre puede controlarse en uno o ms de los siguientes anlisis de sangre:  Medicin de glucosa en la sangre en [place]ley[place]unas. No se le permitir comer durante al menos 8 horas antes de que se tome Colombiauna muestra de Wentworthsangre.  Pruebas al azar de glucosa en la sangre. El nivel de glucosa en  la sangre se controla en cualquier momento del da sin importar el momento en que haya comido.  Prueba de tolerancia a la glucosa oral (PTGO). La glucosa en la sangre se mide despus de no haber comido (ayunas) durante una a tres horas y despus de beber una bebida que contenga glucosa. Dado que las hormonas que causan la resistencia a la insulina son ms altas alrededor Countrywide Financialde las semanas 24 a 28 de Psychiatristembarazo, generalmente se realiza una PTGO durante ese tiempo. Si tiene factores de riesgo, en la primera visita prenatal pueden hacerle pruebas de deteccin de diabetes tipo 2 no diagnosticada. TRATAMIENTO  La diabetes gestacional debe controlarse en primer lugar con dieta y ejercicios. Pueden agregarse medicamentos, pero solo si son necesarios.  Usted tendr que tomar medicamentos para la diabetes o insulina diariamente para Pharmacologistmantener los niveles de glucosa en la sangre en el rango deseado.  Usted tendr American Expressque combinar la dosis de insulina con la actividad fsica y la eleccin de alimentos saludables. Si tiene diabetes gestacional, el objetivo del tratamiento ser State Street Corporationmantener los siguientes niveles sanguneos de glucosa:  Antes de las comidas (preprandial): valor de 95 mg/dl o inferior.  Despus de las comidas (posprandial):  Una hora despus de la comida: valor de 140 mg/dl o inferior.  Dos horas despus de la comida: Licensed conveyancervalor  de 120 mg/dl o inferior. Si tiene diabetes tipo 1 o tipo 2 preexistente, el objetivo del tratamiento ser [employer] siguientes niveles sanguneos de glucosa:  Antes de las comidas, a la hora de acostarse y durante la noche: de 60 a 99 mg/dl.  Despus de las comidas: valor mximo de 100 a 129 mg/dl. INSTRUCCIONES PARA EL CUIDADO EN EL HOGAR   Controle su nivel de hemoglobina A1c dos veces al ao.  Contrlese a diario el nivel de glucosa en la sangre segn las indicaciones de su mdico. Es comn Education officer, environmental controles frecuentes de la glucosa en la [place].  Supervise las cetonas en  la orina cuando est enferma y segn las indicaciones de su mdico.  Tome el medicamento para la diabetes y adminstrese insulina segn las indicaciones de su mdico para Radio producer nivel de glucosa en la sangre en el rango deseado.  Nunca se quede sin medicamento para la diabetes o sin insulina. Es necesario que la reciba [employer].  Ajuste la insulina segn la ingesta de hidratos de carbono. Los hidratos de carbono pueden aumentar los niveles de glucosa en la sangre, pero deben incluirse en su dieta. Los hidratos de carbono aportan vitaminas, minerales y Kellogg que son [place] parte esencial de una dieta saludable. Los hidratos de carbono se encuentran en frutas, verduras, cereales integrales, productos lcteos, legumbres y alimentos que contienen azcares aadidos.  Consuma alimentos saludables. Alterne 3 comidas con 3 colaciones.  Aumente de peso saludablemente. El aumento del peso total vara de acuerdo con el ndice de masa corporal que tena antes del embarazo [facility]).  Lleve una tarjeta de alerta mdica o use una pulsera o medalla de alerta mdica.  Lleve con usted una colacin de 15gramos de hidratos de carbono en todo momento para controlar los niveles bajos de glucosa en la sangre (hipoglucemia). Algunos ejemplos de colaciones de 15gramos de hidratos de carbono son los siguientes:  Tabletas de glucosa, 3 o 4.  Gel de glucosa, tubo de 15 gramos.  Pasas de uva, 2 cucharadas (24 g).  Caramelos de goma, 6.  Galletas de [place], 8.  Jugo de fruta, gaseosa comn, o [place], 4 onzas (120 ml).  Pastillas de goma, 9.  Reconocer la hipoglucemia. Durante el embarazo la hipoglucemia se produce cuando hay niveles de glucosa en la sangre de 60 mg/dl o menos. El riesgo de hipoglucemia aumenta durante el ayuno o cuando se saltea las comidas, durante o despus de Education officer, environmental ejercicio intenso y [place] duerme. Los sntomas de hipoglucemia son:  Temblores o sacudidas.  Disminucin  de la capacidad de concentracin.  Sudoracin.  Aumento de la frecuencia cardaca.  Dolor de [place].  Sequedad en la boca.  Hambre.  Irritabilidad.  Ansiedad.  Sueo agitado.  Alteracin del habla o de la coordinacin.  Confusin.  Tratar la hipoglucemia rpidamente. Si usted est alerta y puede tragar con seguridad, siga la regla de 15/15 que consiste en:  [employer] 15 y 20gramos de glucosa de accin rpida o carbohidratos. Las opciones de accin rpida son un gel de glucosa, tabletas de glucosa, o 4 onzas (120 ml) de jugo de frutas, gaseosa comn, o leche baja en grasa.  Compruebe su nivel de glucosa en la sangre 15 minutos despus de tomar la glucosa.  Tome entre 15 y 20 gramos ms de glucosa si el nivel de glucosa en la sangre todava es de 70mg /dl o inferior.  Ingiera una comida o una colacin en el lapso de 1 hora una vez  que los niveles de glucosa en la sangre vuelven a la normalidad.  Est atento a la poliuria (miccin excesiva) y la polidipsia (sensacin de mucha sed), que son los primeros signos de la hiperglucemia. El reconocimiento temprano de la hiperglucemia permite un tratamiento oportuno. Trate la hiperglucemia segn le indic su mdico.  Haga actividad fsica por lo menos al da o como lo indique su mdico. Se recomienda que 30 minutos despus de cada comida, realice diez minutos de actividad fsica para controlar los niveles de glucosa postprandial en la [place].  Ajuste su dosis de insulina y la ingesta de alimentos, segn sea necesario, si inicia un nuevo ejercicio o deporte.  Siga su plan para los [address] de enfermedad cuando no pueda comer o beber como de [place].  Evite el tabaco y el alcohol.  Concurra a todas las visitas de control como se lo haya indicado el mdico.  Siga el consejo del mdico respecto a los controles prenatales y posteriores al parto (postparto), las visitas, la planificacin de las comidas, el ejercicio, los  medicamentos, las vitaminas, los anlisis de [place], otras pruebas mdicas y [place] fsicas.  Realice diariamente el cuidado de la piel y de los pies. Examine su piel y los pies diariamente para ver si tiene cortes, moretones, enrojecimiento, problemas en las uas, sangrado, ampollas o Advertising account planner.  Cepllese los dientes y encas por lo menos dos veces al da y use hilo dental al menos una vez por da. Concurra regularmente a las visitas de control con el dentista.  Programe un examen de vista durante el primer trimestre de su embarazo o como lo indique su mdico.  Comparta su plan de control de diabetes en el trabajo o en la escuela.  Mantngase al da con las vacunas.  Aprenda a Dealer.  Obtenga la mayor cantidad posible de informacin sobre la diabetes y solicite ayuda siempre que sea necesario.  Obtenga informacin sobre el amamantamiento y analice esta posibilidad.  Debe controlar el nivel de azcar en la sangre de 6a 12semanas despus del parto. Esto se hace con una prueba de tolerancia a la glucosa oral (PTGO). SOLICITE ATENCIN MDICA SI:   No puede comer alimentos o beber por ms de 6 horas.  Tuvo nuseas o ha vomitado durante ms de 6 horas.  Tiene un nivel de glucosa en la sangre de 200 mg/dl y cetonas en la orina.  Presenta algn cambio en el estado mental.  Desarrolla problemas de visin.  Sufre un dolor persistente de Training and development officer.  Siente dolor o molestias en la parte superior del abdomen.  Desarrolla una enfermedad grave adicional.  Tuvo diarrea durante ms de 6 horas.  Ha estado enfermo o ha tenido fiebre durante un par [address] y no mejora. SOLICITE ATENCIN MDICA DE INMEDIATO SI:   Tiene dificultad para respirar.  Ya no siente los movimientos del beb.  Est sangrando o tiene flujo vaginal.  Comienza a tener contracciones o trabajo de [place] prematuro. ASEGRESE DE QUE:  Comprende estas instrucciones.  Controlar su afeccin.  Recibir  ayuda de inmediato si no mejora o si empeora.   Esta informacin no tiene Theme park manager el consejo del mdico. Asegrese de hacerle al mdico cualquier pregunta que tenga.   Document Released: 08/21/2005 Document Revised: 12/02/2014 Elsevier Interactive Patient Education [employer]

## 2016-03-12 NOTE — Progress Notes (Signed)
Subjective:  Maria Daniel is a 35 y.o. [id number] at 394w3d being seen today for ongoing prenatal care.  She is currently monitored for the following issues for this high-risk pregnancy and has History of C-section; Borderline hypertension; Supervision of normal subsequent pregnancy; History of gestational diabetes in prior pregnancy, currently pregnant in third trimester; Obesity affecting pregnancy in third trimester, antepartum; and Gestational diabetes mellitus (GDM) in third trimester controlled on oral hypoglycemic drug on her problem list.  Patient reports no complaints.  Contractions: Not present.  .  Movement: Present. Denies leaking of fluid.  Pt started on glyburide 1 week ago. Brought blood sugar readings today; down from prior to medication but still elevated. Patient reports eating milk & peanut butter toast for breakfast & a cookie for lunch. States unable to come to clinic on Monday mornings for diabetes education.   The following portions of the patient's history were reviewed and updated as appropriate: allergies, current medications, past family history, past medical history, past social history, past surgical history and problem list. Problem list updated.  Objective:   Filed Vitals:   03/12/16 1451  BP: 132/70  Pulse: 84  Weight: 253 lb 8 oz (114.987 kg)    Fetal Status: Fetal Heart Rate (bpm): 154   Movement: Present     General:  Alert, oriented and cooperative. Patient is in no acute distress.  Skin: Skin is warm and dry. No rash noted.   Cardiovascular: Normal heart rate noted  Respiratory: Normal respiratory effort, no problems with respiration noted  Abdomen: Soft, gravid, appropriate for gestational age. Pain/Pressure: Present     Pelvic:       Cervical exam deferred        Extremities: Normal range of motion.     Mental Status: Normal mood and affect. Normal behavior. Normal judgment and thought content.   Urinalysis:      Assessment and Plan:   Pregnancy: [id number] at 754w3d  1. Encounter for supervision of other normal pregnancy in third trimester  - US MFM OB FOLLOW UP; Future  2. Gestational diabetes mellitus (GDM) in third trimester controlled on oral hypoglycemic drug  - US MFM OB FOLLOW UP; Future - glyBURIDE (DIABETA) 5 MG tablet; Take 1 tablet (5 mg total) by mouth 2 (two) times daily with a meal.  Dispense: 60 tablet; Refill: 1 -twice weekly testing to begin next week    Preterm labor symptoms and general obstetric precautions including but not limited to vaginal bleeding, contractions, leaking of fluid and fetal movement were reviewed in detail with the patient. Please refer to After Visit Summary for other counseling recommendations.  Return in about 1 week (around 03/19/2016) for twice [facility].   Judeth HornErin Teon Hudnall, NP

## 2016-03-17 ENCOUNTER — Encounter (HOSPITAL_COMMUNITY): Payer: Self-pay

## 2016-03-17 ENCOUNTER — Inpatient Hospital Stay (HOSPITAL_COMMUNITY)
Admission: AD | Admit: 2016-03-17 | Discharge: 2016-03-17 | Disposition: A | Discharge status: Home / Self Care | Payer: Self-pay | Source: Ambulatory Visit | Attending: Obstetrics & Gynecology | Admitting: Obstetrics & Gynecology

## 2016-03-17 DIAGNOSIS — O26893 Other specified pregnancy related conditions, third trimester: Secondary | ICD-10-CM | POA: Insufficient documentation

## 2016-03-17 DIAGNOSIS — Z3A32 32 weeks gestation of pregnancy: Secondary | ICD-10-CM | POA: Insufficient documentation

## 2016-03-17 DIAGNOSIS — O26899 Other specified pregnancy related conditions, unspecified trimester: Secondary | ICD-10-CM

## 2016-03-17 DIAGNOSIS — O99891 Other specified diseases and conditions complicating pregnancy: Secondary | ICD-10-CM

## 2016-03-17 DIAGNOSIS — M549 Dorsalgia, unspecified: Secondary | ICD-10-CM

## 2016-03-17 DIAGNOSIS — O163 Unspecified maternal hypertension, third trimester: Secondary | ICD-10-CM | POA: Insufficient documentation

## 2016-03-17 DIAGNOSIS — O9989 Other specified diseases and conditions complicating pregnancy, childbirth and the puerperium: Secondary | ICD-10-CM

## 2016-03-17 DIAGNOSIS — R102 Pelvic and perineal pain: Secondary | ICD-10-CM | POA: Insufficient documentation

## 2016-03-17 DIAGNOSIS — O24419 Gestational diabetes mellitus in pregnancy, unspecified control: Secondary | ICD-10-CM | POA: Insufficient documentation

## 2016-03-17 DIAGNOSIS — R109 Unspecified abdominal pain: Secondary | ICD-10-CM

## 2016-03-17 LAB — URINALYSIS, ROUTINE W REFLEX MICROSCOPIC
BILIRUBIN URINE: NEGATIVE
GLUCOSE, UA: NEGATIVE mg/dL
Hgb urine dipstick: NEGATIVE
KETONES UR: NEGATIVE mg/dL
LEUKOCYTES UA: NEGATIVE
Nitrite: NEGATIVE
PROTEIN: NEGATIVE mg/dL
Specific Gravity, Urine: 1.015 (ref 1.005–1.030)
pH: 5.5 (ref 5.0–8.0)

## 2016-03-17 MED ORDER — OXYCODONE-ACETAMINOPHEN 5-325 MG PO TABS
2.0000 | ORAL_TABLET | Freq: Once | ORAL | Status: AC
  Administered 2016-03-17: 2 via ORAL
  Filled 2016-03-17: qty 2

## 2016-03-17 MED ORDER — OXYCODONE-ACETAMINOPHEN 5-325 MG PO TABS: 2.0000 | ORAL_TABLET | Freq: Once | ORAL | Status: DC

## 2016-03-17 NOTE — MAU Provider Note (Signed)
History   [id number] @ 32.1 wks in with low abd pain and and back pain. Pt is previous c/s x 2 for repeat. Pain has been going on since last night.   CSN: [id number]  Arrival date & time 03/17/16  1911   First Provider Initiated Contact with Patient 03/17/16 2000      Chief Complaint  Patient presents with  . Pelvic Pain  . Back Pain    HPI  Past Medical History  Diagnosis Date  . Miscarriage   . Hypertension     no meds patient denies  . Diabetes mellitus without complication (HCC)   . Gestational diabetes   . Knee pain     Past Surgical History  Procedure Laterality Date  . Cesarean section    . Cesarean section  08/17/2012    Procedure: CESAREAN SECTION;  Surgeon: Lesly DukesKelly H Leggett, MD;  Location: [facility];  Service: Obstetrics;  Laterality: N/A;    Family History  Problem Relation Age of Onset  . Hypertension Mother   . Kidney disease Father   . Multiple sclerosis Father     Social History  Substance Use Topics  . Smoking status: Never Smoker   . Smokeless tobacco: Never Used  . Alcohol Use: Yes     Comment: socially    OB History    Gravida Para Term Preterm AB TAB SAB Ectopic Multiple Living   5 2 2  2  2   2       Review of Systems  Constitutional: Negative.   HENT: Negative.   Eyes: Negative.   Respiratory: Negative.   Cardiovascular: Negative.   Gastrointestinal: Positive for abdominal pain.  Musculoskeletal: Positive for back pain.  Skin: Negative.   Allergic/Immunologic: Negative.   Neurological: Negative.   Hematological: Negative.   Psychiatric/Behavioral: Negative.     Allergies  Review of patient's allergies indicates no known allergies.  Home Medications  No current outpatient prescriptions on file.  BP 132/81 mmHg  Pulse 80  Temp(Src) 98.1 F (36.7 C) (Oral)  Resp 18  Ht 5\' 1"  (1.549 m)  Wt 260 lb (117.935 kg)  BMI 49.15 kg/m2  SpO2 100%  LMP 08/13/2015  Physical Exam  Constitutional: She is oriented to person, place, and  time. She appears well-developed and well-nourished.  HENT:  Head: Normocephalic.  Eyes: Pupils are equal, round, and reactive to light.  Neck: Normal range of motion.  Cardiovascular: Normal rate, regular rhythm, normal heart sounds and intact distal pulses.   Pulmonary/Chest: Effort normal and breath sounds normal.  Abdominal: Soft. Bowel sounds are normal.  Genitourinary: Vagina normal and uterus normal.  Musculoskeletal: Normal range of motion.  Neurological: She is alert and oriented to person, place, and time. She has normal reflexes.  Skin: Skin is warm and dry.  Psychiatric: She has a normal mood and affect. Her behavior is normal. Judgment and thought content normal.    MAU Course  Procedures (including critical care time)  Labs Reviewed  URINALYSIS, ROUTINE W REFLEX MICROSCOPIC (NOT AT [facility])   No results found.   No diagnosis found.    MDM  SVE cl/th/post/high. No uc's. Discussed comfort measures at home and will give Rx for percocet for rest and pain management at night.

## 2016-03-17 NOTE — MAU Note (Signed)
Pt c/o pelvic pain and back pain. Pelvic pain started this morning and back pain that started last night. Feels like contractions. Rates 10/10. Denies vaginal bleeding or LOF. +FM. Denies hx of PTL. Denies urinary s/s.

## 2016-03-17 NOTE — Discharge Instructions (Signed)
Dolor abdominal en el embarazo (Abdominal Pain During Pregnancy) El dolor abdominal es frecuente durante el embarazo. Generalmente no causa ningn dao. El dolor abdominal puede tener numerosas causas. Algunas causas son ms graves que otras. Ciertas causas de dolor abdominal durante el embarazo se diagnostican fcilmente. A veces, se tarda un tiempo para llegar al diagnstico. Otras veces la causa no se conoce. El dolor abdominal puede estar relacionado con [place] alteracin del Frannie, o puede deberse a una causa totalmente diferente. Por este motivo, siempre consulte a su mdico cuando sienta molestias abdominales. INSTRUCCIONES PARA EL CUIDADO EN EL HOGAR  Est atenta al dolor para ver si hay cambios. Las siguientes indicaciones ayudarn a Psychologist, educational [employer] pueda sentir:  No Chiropodist sexuales y no coloque nada dentro de la vagina hasta que los sntomas hayan desaparecido completamente.  Descanse todo lo que pueda [employer] dolor se le haya calmado.  Si siente nuseas, beba lquidos claros. Evite los alimentos slidos mientras sienta malestar o tenga nuseas.  Tome slo medicamentos de venta libre o recetados, segn las indicaciones del mdico.  Cumpla con todas las visitas de control, segn le indique su mdico. SOLICITE ATENCIN MDICA DE INMEDIATO SI:  Tiene un sangrado, prdida de lquidos o elimina tejidos por la vagina.  El dolor o los clicos [place].  Tiene vmitos persistentes.  Comienza a Financial risk analyst al orinar u [employer].  Tiene fiebre.  Nota que los movimientos del beb disminuyen.  Siente intensa debilidad o se marea.  Tiene dificultad para respirar con o sin dolor abdominal.  Siente un dolor de cabeza intenso junto al dolor abdominal.  Shelle Iron secrecin vaginal anormal con dolor abdominal.  Tiene diarrea persistente.  El dolor abdominal sigue o empeora an despus de Field seismologist. ASEGRESE DE QUE:   Comprende estas  instrucciones.  Controlar su afeccin.  Recibir ayuda de inmediato si no mejora o si empeora.   Esta informacin no tiene Theme park manager el consejo del mdico. Asegrese de hacerle al mdico cualquier pregunta que tenga.   Document Released: 11/11/2005 Document Revised: 09/01/2013 Elsevier Interactive Patient Education 2016 [employer]. Dolor de espalda durante el embarazo  (Back Pain in Pregnancy)  El dolor de espalda es habitual durante el [place]. Ocurre en aproximadamente la mitad de todos los [place]. Es importante para usted y su beb que permanezca activa durante el [place].Si siente que Chief Technology Officer de espalda es lo que no le permite mantenerse activa o dormir bien, Scientist, clinical (histocompatibility and immunogenetics) a su mdico. La causa del dolor de espalda puede deberse a varios factores relacionados con los cambios durante el [place].Afortunadamente, excepto que haya tenido problemas de espalda antes del [place], es probable que el dolor mejore despus del [place]. El dolor lumbar por lo general ocurre entre el quinto y sptimo mes del Psychiatrist. Sin embargo, puede ocurrir [employer] primeros meses. Otros factores que aumentan el riesgo son:   Problemas previos en la espalda.  Lesiones en la espalda.  Tener gemelos o embarazos mltiples.  Tos persistente.  El estrs.  Movimientos repetitivos relacionados con Kathie Dike.  Enfermedad muscular o de la columna vertebral en la espalda.  Antecedentes familiares de problemas de espalda, rotura (hernia) de discos u osteoporosis.  Depresin, ansiedad y crisis de [place]. CAUSAS   En las embarazadas, el cuerpo produce una hormona llamada relaxina. Esta hormona hace que los ligamentos que conectan la zona lumbar y los huesos del pubis sean ms flexibles. Esta flexibilidad permite que el beb nazca  con ms facilidad. Cuando los ligamentos estn relajados, los msculos tienen que trabajar ms para apoyar la espalda. El dolor en la espalda puede deberse al  cansancio muscular. El dolor tambin puede tener su causa en la irritacin de los tejidos de a espalda que se irritan ya que estn recibiendo menos apoyo.  A medida que el beb crece, ejerce presin United Stationerssobre los nervios y los vasos sanguneos de la pelvis. Esto causa dolor de espalda.  A medida que el beb crece y [address] el peso durante el [place], el tero presiona los msculos del estmago hacia adelante y [place] su centro de gravedad. Esto hace que los msculos de la espalda deban trabajar ms para mantener una buena [place]postura. SNTOMAS  Dolor lumbar durante el embarazo Generalmente se produce en la zona o por arriba de la cintura en el centro de la espalda. Puede haber dolor y entumecimiento que se irradia hacia la pierna o el pie. Es similar al dolor de espalda baja experimentada por las mujeres no embarazadas. Por lo general, aumenta al UnitedHealthpermanecer de pie o sentada por largos perodos de [place]iempo, o con levantamientos repetitivos Tambin puede haber sensibilidad en los msculos en la zona superior de la espalda .  Dolor plvico posterior Environmental consultantdurante el embarazo Dolor en la parte posterior de la pelvis es ms frecuente que el dolor lumbar en el embarazo. Se trata de un dolor profundo que se siente a un lado en la cintura, o a travs del cxis (sacro), o en ambos lugares. Puede sentir dolor en uno o ambos lados Este dolor tambin puede sentirse en las nalgas y el dorso de los muslos Tambin puede haber dolor pbico y en la ingle. El dolor no se mejora rpidamente con el reposo, y [place] Republictambin puede haber rigidez matutina. Muchas actividades pueden causarlo. Un buen estado fsico antes y [address]ante el [address] puede o no prevenir este problema. Las contracciones del parto suelen aparecer cada 1 a 2 minutos, tienen una duracin de aproximadamente 1 minuto, e implica una sensacin de empujar o presin en la pelvis. Sin embargo, si usted est a trmino con Firefighterel embarazo, Chief Technology Officerel dolor constante en la zona lumbar puede indicar el  comienzo de un parto prematuro, y usted debe ser consciente de ello.  DIAGNSTICO  No se deben tomar radiografas de la El [place] Corporationespalda durante las primeras 12 a 14 semanas del embarazo y durante el resto del Psychiatristembarazo, slo cuando sea absolutamente necesario. La resonancia magntica no emite radiacin y es un estudio seguro durante el Psychiatristembarazo. Pero tambin se deben hacer solamente cuando sea absolutamente necesario.  INSTRUCCIONES PARA EL CUIDADO EN EL HOGAR   Realice actividad fsica segn las indicaciones del mdico. El ejercicio es la manera ms eficaz para prevenir o tratar Chief Technology Officerel dolor de espalda. Si tiene un problema en la espalda, es especialmente importante evitar los deportes que requieran de movimientos corporales rpidos. La natacin y las caminatas son las mejores [address]actividades.  No permanezca sentada o de pie en el mismo lugar durante largos perodos.  No use tacos altos.  Sintese en la silla con una buena postura. Use una almohada en su espalda baja si es necesario. Asegrese de que su cabeza descansa sobre sus hombros y no est colgando hacia delante.  Trate de dormir de lado, de preferencia el lado izquierdo, con una o The PNC Financialdos almohadas entre las piernas. Si est dolorida despus de una noche de descanso, la cama puede ser OGE Energydemasiado blanda.Trate de colocar una tabla entre el colchn y el somier.  Prstele atencin a su cuerpo cuando se levante.Si siente dolor,pida ayuda o trate de doblar las rodillas ms para [employer] de las piernas en lugar de los msculos de la espalda. Pngase en cuclillas al levantar algo del suelo. No se doble.  Consuma una dieta saludable. Trate de aumentar de peso dentro de las recomendaciones de su mdico.  Utilice compresas de calor o fro de 3 a 4 veces al da durante 15 minutos para Primary school teacher.  Solo tome medicamentos que se pueden comprar sin receta o recetados para Chief Technology Officer, Dentist o fiebre, como le indica el mdico. Dolor de espaldas  repentino (agudo).  Haga reposo en cama slo en caso de los episodios ms extremos y agudos de Engineer, mining. El reposo prolongado en cama de ms de 48 horas agravar su trastorno.  El hielo es muy efectivo en los problemas agudos.  Ponga el hielo en una bolsa plstica.  Colquese una toalla entre la piel y la bolsa de hielo.  Deje el hielo durante 10 a 20 minutos cada 2 horas o segn lo nesecite, mientras se encuentre despierta.  Las compresas de calor durante 30 minutos antes de las actividades tambin puede ayudar. Dolor crnico en la espalda. Consulte a su mdico si el dolor es continuo. El mdico podr ayudarla o derivarla para que realice los ejercicios y trabajos de fortalecimiento apropiados. Con un buen entrenamiento fsico, podr evitar la mayor parte de los [place]. En algunos casos, la causa es un problema ms grave. Debe ser controlada inmediatamente si aparecen nuevos problemas. El mdico tambin podr recomendar:   Una faja de maternidad.  Un arns elstico.  Un cors para la espalda.  Un masajista o acupuntura. SOLICITE ATENCIN MDICA SI:   No puede Careers information officer de sus actividades diarias, an tomando los medicamentos para Psychologist, occupational.  Beverlee Nims ser derivada a un fisioterapeuta o quiroprxico.  Beverlee Nims intentar con acupuntura. SOLICITE ATENCIN MDICA DE INMEDIATO SI:   Siente entumecimiento, hormigueo, debilidad o problemas con el uso de los brazos o las piernas.  Siente un dolor de espalda muy intenso que no se alivia con medicamentos.  Tiene modificaciones repentinas en el control de la vejiga o el intestino.  Aumenta el dolor en otras partes del cuerpo.  Siente que le falta el aire, se marea o sufre un [place].  Tiene nuseas, vmitos o sudoracin.  Siente un dolor en la espalda similar al del [place] de [place].  Cuando aparece [employer], rompe la bolsa de aguas o tiene un sangrado vaginal.  El dolor o el adormecimiento se  extienden hacia la pierna.  El dolor aparece despus de una cada.  Siente dolor de un solo lado. Podra tener clculos renales.  Observa sangre en la orina. Podra tener una infeccin en la vejiga o clculos renales.  Siente dolor y aparecen ronchas. Podra tener culebrilla. El dolor de espalda es bastante frecuente durante el embarazo pero no debe aceptarse slo como parte del [place]. Siempre debe tratarse lo ms rpidamente posible. Har que su embarazo sea lo ms placentero posible.    Esta informacin no tiene Theme park manager el consejo del mdico. Asegrese de hacerle al mdico cualquier pregunta que tenga.   Document Released: 07/24/2011 Document Revised: 02/03/2012 Elsevier Interactive Patient Education [employer]

## 2016-03-18 ENCOUNTER — Other Ambulatory Visit: Payer: Self-pay

## 2016-03-21 ENCOUNTER — Other Ambulatory Visit: Payer: Self-pay

## 2016-03-21 ENCOUNTER — Encounter: Payer: Self-pay | Admitting: Family Medicine

## 2016-03-21 ENCOUNTER — Ambulatory Visit (HOSPITAL_COMMUNITY): Payer: Self-pay

## 2016-03-25 ENCOUNTER — Ambulatory Visit (INDEPENDENT_AMBULATORY_CARE_PROVIDER_SITE_OTHER): Payer: Self-pay | Admitting: Advanced Practice Midwife

## 2016-03-25 VITALS — BP 138/62 | HR 83 | Wt 260.0 lb

## 2016-03-25 DIAGNOSIS — O26893 Other specified pregnancy related conditions, third trimester: Secondary | ICD-10-CM

## 2016-03-25 DIAGNOSIS — O24415 Gestational diabetes mellitus in pregnancy, controlled by oral hypoglycemic drugs: Secondary | ICD-10-CM

## 2016-03-25 DIAGNOSIS — R102 Pelvic and perineal pain: Secondary | ICD-10-CM

## 2016-03-25 LAB — POCT URINALYSIS DIP (DEVICE)
BILIRUBIN URINE: NEGATIVE
Glucose, UA: 250 mg/dL — AB
Hgb urine dipstick: NEGATIVE
KETONES UR: NEGATIVE mg/dL
Nitrite: NEGATIVE
PH: 6 (ref 5.0–8.0)
Protein, ur: NEGATIVE mg/dL
Specific Gravity, Urine: 1.025 (ref 1.005–1.030)
Urobilinogen, UA: 4 mg/dL — ABNORMAL HIGH (ref 0.0–1.0)

## 2016-03-25 NOTE — Progress Notes (Signed)
Subjective:  Jackquline Deidre AlaSerrano Maldonado is a 35 y.o. [phone] at 1119w2d being seen today for ongoing prenatal care.  She is currently monitored for the following issues for this high-risk pregnancy and has History of C-section; Borderline hypertension; Supervision of normal subsequent pregnancy; History of gestational diabetes in prior pregnancy, currently pregnant in third trimester; Obesity affecting pregnancy in third trimester, antepartum; and Gestational diabetes mellitus (GDM) in third trimester controlled on oral hypoglycemic drug on her problem list.  Patient reports constant pelvic pressure and intermittent LLQ sharp pain when standing up from sitting position.  Contractions: Not present. Vag. Bleeding: None.  Movement: Present. Denies leaking of fluid.   The following portions of the patient's history were reviewed and updated as appropriate: allergies, current medications, past family history, past medical history, past social history, past surgical history and problem list. Problem list updated.  Objective:   Filed Vitals:   03/25/16 1622  BP: 138/62  Pulse: 83  Weight: 260 lb (117.935 kg)    Fetal Status: Fetal Heart Rate (bpm): NST-R   Movement: Present     General:  Alert, oriented and cooperative. Patient is in no acute distress.  Skin: Skin is warm and dry. No rash noted.   Cardiovascular: Normal heart rate noted  Respiratory: Normal respiratory effort, no problems with respiration noted  Abdomen: Soft, gravid, appropriate for gestational age. Pain/Pressure: Present     Pelvic: Vag. Bleeding: None     Cervical exam deferred        Extremities: Normal range of motion.  Edema: Trace  Mental Status: Normal mood and affect. Normal behavior. Normal judgment and thought content.   Urinalysis: Urine Protein: 2+ Urine Glucose: Negative  Assessment and Plan:  Pregnancy: [id number] at 219w2d  1. Gestational diabetes mellitus (GDM) in third trimester controlled on oral hypoglycemic  drug  - Fetal nonstress test  2. Pelvic pain affecting pregnancy in third trimester, antepartum --Pregnancy support belt/rest/ice/heat/Tylenol  Preterm labor symptoms and general obstetric precautions including but not limited to vaginal bleeding, contractions, leaking of fluid and fetal movement were reviewed in detail with the patient. Please refer to After Visit Summary for other counseling recommendations.  Return in about 3 days (around 03/28/2016) for NST.   Hurshel PartyLisa A Leftwich-Kirby, CNM

## 2016-03-25 NOTE — Progress Notes (Signed)
Mariel used for interpreter  Patient feels the baby doesn't move as much when she takes Microbiologistpercocet

## 2016-03-28 ENCOUNTER — Other Ambulatory Visit: Payer: Self-pay

## 2016-04-01 ENCOUNTER — Ambulatory Visit (HOSPITAL_COMMUNITY)
Admission: RE | Admit: 2016-04-01 | Discharge: 2016-04-01 | Disposition: A | Discharge status: Home / Self Care | Payer: Self-pay | Source: Ambulatory Visit | Attending: Student | Admitting: Student

## 2016-04-01 ENCOUNTER — Other Ambulatory Visit: Payer: Self-pay | Admitting: Student

## 2016-04-01 ENCOUNTER — Encounter (HOSPITAL_COMMUNITY): Payer: Self-pay

## 2016-04-01 VITALS — BP 120/60 | HR 78 | Wt 259.2 lb

## 2016-04-01 DIAGNOSIS — O99213 Obesity complicating pregnancy, third trimester: Secondary | ICD-10-CM

## 2016-04-01 DIAGNOSIS — Z3A34 34 weeks gestation of pregnancy: Secondary | ICD-10-CM | POA: Insufficient documentation

## 2016-04-01 DIAGNOSIS — Z3483 Encounter for supervision of other normal pregnancy, third trimester: Secondary | ICD-10-CM

## 2016-04-01 DIAGNOSIS — O24415 Gestational diabetes mellitus in pregnancy, controlled by oral hypoglycemic drugs: Secondary | ICD-10-CM | POA: Insufficient documentation

## 2016-04-01 DIAGNOSIS — O09523 Supervision of elderly multigravida, third trimester: Secondary | ICD-10-CM

## 2016-04-01 DIAGNOSIS — O3663X0 Maternal care for excessive fetal growth, third trimester, not applicable or unspecified: Secondary | ICD-10-CM

## 2016-04-01 DIAGNOSIS — O34219 Maternal care for unspecified type scar from previous cesarean delivery: Secondary | ICD-10-CM | POA: Insufficient documentation

## 2016-04-01 DIAGNOSIS — O3660X0 Maternal care for excessive fetal growth, unspecified trimester, not applicable or unspecified: Secondary | ICD-10-CM | POA: Insufficient documentation

## 2016-04-02 ENCOUNTER — Ambulatory Visit (INDEPENDENT_AMBULATORY_CARE_PROVIDER_SITE_OTHER): Payer: Self-pay | Admitting: Advanced Practice Midwife

## 2016-04-02 VITALS — BP 122/73 | HR 80 | Wt 260.0 lb

## 2016-04-02 DIAGNOSIS — O24415 Gestational diabetes mellitus in pregnancy, controlled by oral hypoglycemic drugs: Secondary | ICD-10-CM

## 2016-04-02 DIAGNOSIS — Z98891 History of uterine scar from previous surgery: Secondary | ICD-10-CM

## 2016-04-02 DIAGNOSIS — O34219 Maternal care for unspecified type scar from previous cesarean delivery: Secondary | ICD-10-CM

## 2016-04-02 DIAGNOSIS — M7918 Myalgia, other site: Secondary | ICD-10-CM

## 2016-04-02 LAB — POCT URINALYSIS DIP (DEVICE)
GLUCOSE, UA: 500 mg/dL — AB
Hgb urine dipstick: NEGATIVE
KETONES UR: NEGATIVE mg/dL
Nitrite: NEGATIVE
PROTEIN: 30 mg/dL — AB
SPECIFIC GRAVITY, URINE: 1.02 (ref 1.005–1.030)
Urobilinogen, UA: 1 mg/dL (ref 0.0–1.0)
pH: 5.5 (ref 5.0–8.0)

## 2016-04-02 MED ORDER — METFORMIN HCL 1000 MG PO TABS: 1000.0000 mg | ORAL_TABLET | Freq: Two times a day (BID) | ORAL | Status: DC

## 2016-04-02 NOTE — Progress Notes (Signed)
   Subjective:  Maria Daniel is a 35 y.o. [id number] at 4965w3d being seen today for ongoing prenatal care.  She is currently monitored for the following issues for this high-risk pregnancy and has History of C-section; Borderline hypertension; Supervision of normal subsequent pregnancy; History of gestational diabetes in prior pregnancy, currently pregnant in third trimester; Obesity affecting pregnancy in third trimester, antepartum; Gestational diabetes mellitus (GDM) in third trimester controlled on oral hypoglycemic drug; and LGA (large for gestational age) fetus affecting management of mother on her problem list.  Patient reports occasional right mid abdominal pain, increased with movement.  .  Contractions: Not present. Vag. Bleeding: None.  Movement: (!) Decreased. Denies leaking of fluid.   The following portions of the patient's history were reviewed and updated as appropriate: allergies, current medications, past family history, past medical history, past social history, past surgical history and problem list. Problem list updated.  Objective:   Filed Vitals:   04/02/16 1518  BP: 122/73  Pulse: 80  Weight: 260 lb (117.935 kg)    Fetal Status: Fetal Heart Rate (bpm): NST   Movement: (!) Decreased     General:  Alert, oriented and cooperative. Patient is in no acute distress.  Skin: Skin is warm and dry. No rash noted.   Cardiovascular: Normal heart rate noted  Respiratory: Normal respiratory effort, no problems with respiration noted  Abdomen: Soft, gravid, appropriate for gestational age. Pain/Pressure: Present     Pelvic: Vag. Bleeding: None     Cervical exam deferred        Extremities: Normal range of motion.  Edema: Trace  Mental Status: Normal mood and affect. Normal behavior. Normal judgment and thought content.   Urinalysis: Urine Protein: 1+ Urine Glucose: 3+  Assessment and Plan:  Pregnancy: [id number] at 165w3d  1. Gestational diabetes mellitus (GDM) in third  trimester controlled on oral hypoglycemic drug  -- BS Log Fastings 76-101, most in 90s.  PP 127 to 206, all above 120. Pt does not want to do insulin again. Consult Dr Macon LargeAnyanwu.   -- Start Metformin 1000 mg BID.  [facility]  - US MFM FETAL BPP WO NON STRESS; Future - US MFM FETAL BPP WO NON STRESS; Future - US MFM FETAL BPP WO NON STRESS; Future  2. History of C-section --Per chart, patient insists on C/S to be done on 05/17/16 (other two born on 23rd).  Discussed scheduled C/S at 39 weeks, on June 10 with pt and s/o.  Dating based on 6 week US so accurate.  Do NOT recommend delivery on 6/23 as pt desires. Discussed risks of pregnancy post 39 weeks with diabetes, including fetal demise.  Pt states understanding and agrees with plan of care for delivery on June 10.  3. Musculoskeletal pain --Rest/ice/heat/warm bath/Tylenol for pain.  Warning signs of pregnancy reviewed.  Preterm labor symptoms and general obstetric precautions including but not limited to vaginal bleeding, contractions, leaking of fluid and fetal movement were reviewed in detail with the patient. Please refer to After Visit Summary for other counseling recommendations.  No Follow-up on file. F/U with weekly testing as scheduled.  Hurshel PartyLisa A Leftwich-Kirby, CNM

## 2016-04-02 NOTE — Progress Notes (Signed)
Pt states she is taking Glyburide  - 1 tablet TID.  US done yesterday. Pt states she can only come to weekly appts due to her husband's work schedule and transportation. Pt to be scheduled for weekly BPP/NST and Ob fu all on Mondays.

## 2016-04-03 ENCOUNTER — Encounter (HOSPITAL_COMMUNITY): Payer: Self-pay | Admitting: *Deleted

## 2016-04-06 ENCOUNTER — Other Ambulatory Visit: Payer: Self-pay | Admitting: Family Medicine

## 2016-04-08 ENCOUNTER — Ambulatory Visit (HOSPITAL_COMMUNITY)
Admission: RE | Admit: 2016-04-08 | Discharge: 2016-04-08 | Disposition: A | Discharge status: Home / Self Care | Payer: Self-pay | Source: Ambulatory Visit | Attending: Advanced Practice Midwife | Admitting: Advanced Practice Midwife

## 2016-04-08 ENCOUNTER — Ambulatory Visit (INDEPENDENT_AMBULATORY_CARE_PROVIDER_SITE_OTHER): Payer: Self-pay | Admitting: Obstetrics and Gynecology

## 2016-04-08 ENCOUNTER — Encounter: Payer: Self-pay | Admitting: Obstetrics and Gynecology

## 2016-04-08 VITALS — BP 124/62 | HR 72 | Wt 262.9 lb

## 2016-04-08 DIAGNOSIS — Z3483 Encounter for supervision of other normal pregnancy, third trimester: Secondary | ICD-10-CM

## 2016-04-08 DIAGNOSIS — O99213 Obesity complicating pregnancy, third trimester: Secondary | ICD-10-CM | POA: Insufficient documentation

## 2016-04-08 DIAGNOSIS — Z8632 Personal history of gestational diabetes: Secondary | ICD-10-CM

## 2016-04-08 DIAGNOSIS — O09523 Supervision of elderly multigravida, third trimester: Secondary | ICD-10-CM | POA: Insufficient documentation

## 2016-04-08 DIAGNOSIS — O34219 Maternal care for unspecified type scar from previous cesarean delivery: Secondary | ICD-10-CM | POA: Insufficient documentation

## 2016-04-08 DIAGNOSIS — Z98891 History of uterine scar from previous surgery: Secondary | ICD-10-CM

## 2016-04-08 DIAGNOSIS — O24415 Gestational diabetes mellitus in pregnancy, controlled by oral hypoglycemic drugs: Secondary | ICD-10-CM

## 2016-04-08 DIAGNOSIS — Z3A35 35 weeks gestation of pregnancy: Secondary | ICD-10-CM | POA: Insufficient documentation

## 2016-04-08 DIAGNOSIS — O09293 Supervision of pregnancy with other poor reproductive or obstetric history, third trimester: Secondary | ICD-10-CM

## 2016-04-08 DIAGNOSIS — E669 Obesity, unspecified: Secondary | ICD-10-CM

## 2016-04-08 LAB — POCT URINALYSIS DIP (DEVICE)
Glucose, UA: NEGATIVE mg/dL
KETONES UR: NEGATIVE mg/dL
NITRITE: NEGATIVE
Protein, ur: 30 mg/dL — AB
Specific Gravity, Urine: 1.02 (ref 1.005–1.030)
Urobilinogen, UA: 1 mg/dL (ref 0.0–1.0)
pH: 5.5 (ref 5.0–8.0)

## 2016-04-08 MED ORDER — GLYBURIDE 2.5 MG PO TABS: 2.5000 mg | ORAL_TABLET | Freq: Two times a day (BID) | ORAL | Status: DC

## 2016-04-08 NOTE — Progress Notes (Signed)
Subjective:  Maria Daniel is a [age] y.oBaird Daniel. [id number] at 1859w2d being seen today for ongoing prenatal care.  She is currently monitored for the following issues for this high-risk pregnancy and has History of C-section; Borderline hypertension; Supervision of normal subsequent pregnancy; History of gestational diabetes in prior pregnancy, currently pregnant in third trimester; Obesity affecting pregnancy in third trimester, antepartum; Gestational diabetes mellitus (GDM) in third trimester controlled on oral hypoglycemic drug; and LGA (large for gestational age) fetus affecting management of mother on her problem list.  Patient reports no complaints.  Contractions: Not present. Vag. Bleeding: None.  Movement: Present. Denies leaking of fluid.   The following portions of the patient's history were reviewed and updated as appropriate: allergies, current medications, past family history, past medical history, past social history, past surgical history and problem list. Problem list updated.  Objective:   Filed Vitals:   04/08/16 1452  BP: 124/62  Pulse: 72  Weight: 262 lb 14.4 oz (119.251 kg)    Fetal Status: Fetal Heart Rate (bpm): NST   Movement: Present     General:  Alert, oriented and cooperative. Patient is in no acute distress.  Skin: Skin is warm and dry. No rash noted.   Cardiovascular: Normal heart rate noted  Respiratory: Normal respiratory effort, no problems with respiration noted  Abdomen: Soft, gravid, appropriate for gestational age. Pain/Pressure: Present     Pelvic: Vag. Bleeding: None     Cervical exam deferred        Extremities: Normal range of motion.  Edema: Mild pitting, slight indentation  Mental Status: Normal mood and affect. Normal behavior. Normal judgment and thought content.   Urinalysis:      Assessment and Plan:  Pregnancy: [id number] at 6059w2d  1. Gestational diabetes mellitus (GDM) in third trimester controlled on oral hypoglycemic drug CBGs reviewed  and all out of range. Fasting as high as 106 and pp as high as 176. Will add glyburide 2.5 mg BID - Fetal nonstress test- NST reviewed and reactive   2. History of C-section Patient will be scheduled for a repeat  3. Encounter for supervision of other normal pregnancy in third trimester Continue current care  4. History of gestational diabetes in prior pregnancy, currently pregnant in third trimester   5. Obesity affecting pregnancy in third trimester, antepartum   Preterm labor symptoms and general obstetric precautions including but not limited to vaginal bleeding, contractions, leaking of fluid and fetal movement were reviewed in detail with the patient. Please refer to After Visit Summary for other counseling recommendations.  Return in about 7 days (around 04/15/2016) for as scheduled.   Catalina AntiguaPeggy Crew Goren, MD

## 2016-04-08 NOTE — Progress Notes (Signed)
BPP scheduled today and weekly - pt can only come to weekly appts due to husband's work schedule

## 2016-04-15 ENCOUNTER — Encounter: Payer: Self-pay | Admitting: Obstetrics & Gynecology

## 2016-04-15 ENCOUNTER — Other Ambulatory Visit: Payer: Self-pay | Admitting: Advanced Practice Midwife

## 2016-04-15 ENCOUNTER — Ambulatory Visit (HOSPITAL_COMMUNITY)
Admission: RE | Admit: 2016-04-15 | Discharge: 2016-04-15 | Disposition: A | Discharge status: Home / Self Care | Payer: Self-pay | Source: Ambulatory Visit | Attending: Advanced Practice Midwife | Admitting: Advanced Practice Midwife

## 2016-04-15 ENCOUNTER — Ambulatory Visit (INDEPENDENT_AMBULATORY_CARE_PROVIDER_SITE_OTHER): Payer: Self-pay | Admitting: Obstetrics & Gynecology

## 2016-04-15 VITALS — BP 145/70 | HR 79 | Wt 264.9 lb

## 2016-04-15 DIAGNOSIS — O09523 Supervision of elderly multigravida, third trimester: Secondary | ICD-10-CM

## 2016-04-15 DIAGNOSIS — O34219 Maternal care for unspecified type scar from previous cesarean delivery: Secondary | ICD-10-CM

## 2016-04-15 DIAGNOSIS — Z3A36 36 weeks gestation of pregnancy: Secondary | ICD-10-CM

## 2016-04-15 DIAGNOSIS — O24415 Gestational diabetes mellitus in pregnancy, controlled by oral hypoglycemic drugs: Secondary | ICD-10-CM

## 2016-04-15 DIAGNOSIS — O99213 Obesity complicating pregnancy, third trimester: Secondary | ICD-10-CM

## 2016-04-15 DIAGNOSIS — Z98891 History of uterine scar from previous surgery: Secondary | ICD-10-CM

## 2016-04-15 DIAGNOSIS — Z113 Encounter for screening for infections with a predominantly sexual mode of transmission: Secondary | ICD-10-CM

## 2016-04-15 LAB — POCT URINALYSIS DIP (DEVICE)
Glucose, UA: NEGATIVE mg/dL
HGB URINE DIPSTICK: NEGATIVE
Ketones, ur: 15 mg/dL — AB
NITRITE: NEGATIVE
PH: 6 (ref 5.0–8.0)
PROTEIN: 30 mg/dL — AB
Specific Gravity, Urine: >=1.03 (ref 1.005–1.030)
UROBILINOGEN UA: 2 mg/dL — AB (ref 0.0–1.0)

## 2016-04-15 NOTE — Progress Notes (Signed)
NST reactive today Subjective: discussed indication for scheduled cesarean 39 weeks  Maria Daniel is a 35 y.o. [id number] at 4469w2d being seen today for ongoing prenatal care.  She is currently monitored for the following issues for this high-risk pregnancy and has History of C-section; Borderline hypertension; Supervision of normal subsequent pregnancy; History of gestational diabetes in prior pregnancy, currently pregnant in third trimester; Obesity affecting pregnancy in third trimester, antepartum; Gestational diabetes mellitus (GDM) in third trimester controlled on oral hypoglycemic drug; and LGA (large for gestational age) fetus affecting management of mother on her problem list.  Patient reports pelvic pressure.  Contractions: Not present. Vag. Bleeding: None.  Movement: (!) Decreased. Denies leaking of fluid.   The following portions of the patient's history were reviewed and updated as appropriate: allergies, current medications, past family history, past medical history, past social history, past surgical history and problem list. Problem list updated.  Objective:   Filed Vitals:   04/15/16 1556  BP: 145/70  Pulse: 79  Weight: 264 lb 14.4 oz (120.158 kg)    Fetal Status:     Movement: (!) Decreased     General:  Alert, oriented and cooperative. Patient is in no acute distress.  Skin: Skin is warm and dry. No rash noted.   Cardiovascular: Normal heart rate noted  Respiratory: Normal respiratory effort, no problems with respiration noted  Abdomen: Soft, gravid, appropriate for gestational age. Pain/Pressure: Present     Pelvic: Vag. Bleeding: None     Cervical exam performed        Extremities: Normal range of motion.  Edema: Mild pitting, slight indentation  Mental Status: Normal mood and affect. Normal behavior. Normal judgment and thought content.   Urinalysis:      Assessment and Plan:  Pregnancy: [id number] at 5469w2d  1. Gestational diabetes mellitus (GDM) in third  trimester controlled on oral hypoglycemic drug NST reactive and AFI nl, state CBG in range, no record to review - Fetal nonstress test - Culture, beta strep (group b only) - GC[facility]Chlamydia probe amp [facility]  2. History of C-section Repeat at 39 weeks   Preterm labor symptoms and general obstetric precautions including but not limited to vaginal bleeding, contractions, leaking of fluid and fetal movement were reviewed in detail with the patient. Please refer to After Visit Summary for other counseling recommendations.  Return in about 8 days (around 04/23/2016) for as scheduled.   Adam PhenixJames G Cordelle Dahmen, MD

## 2016-04-15 NOTE — Progress Notes (Signed)
BPP done today - 8/8 per verbal report.  Pt is only able to come for once weekly fetal testing appointments. US for growth/BPP scheduled 5/30

## 2016-04-15 NOTE — Patient Instructions (Signed)
Parto por cesárea - Cuidados posteriores  °(Cesarean Delivery, Care After) °Siga estas instrucciones durante las próximas semanas. Estas indicaciones le proporcionan información general acerca de cómo deberá cuidarse después del procedimiento. El médico también podrá darle instrucciones más específicas. El tratamiento se ha planificado de acuerdo a las prácticas médicas actuales, pero a veces se producen problemas. Comuníquese con el médico si tiene algún problema o tiene dudas cuando vuelva a su casa.  °INSTRUCCIONES PARA EL CUIDADO EN EL HOGAR  °· Tome sólo medicamentos de venta libre o recetados, según las indicaciones del médico. °· No beba alcohol, especialmente si está amamantando o toma analgésicos. °· Nomastique tabaco ni fume. °· Continúe con un adecuado cuidado perineal. El buen cuidado perineal incluye: °¨ Higienizarse de adelante hacia atrás. °¨ Mantener la zona perineal limpia. °· Controlar diariamente el corte (incisión) y observar si aumenta el enrojecimiento, si supura, se hincha o se separa la piel. °· Limpie la incisión suavemente con jabón y agua todos los días, y luego séquela dando golpecitos. Si el médico la autoriza, deje la incisión al descubierto. Use un apósito (vendaje) si drena líquido o la incisión parece irritada. Si las pequeñas tiras adhesivas que cruzan la incisión no se caen dentro de los 7 días, retírelas suavemente. °· Abrace una almohada al toser o estornudar hasta que la incisión se cure. Esto ayuda a aliviar el dolor. °· No conduzca vehículos ni opere maquinarias hasta que el médico la autorice. °· Dúchese, lávese el cabello y tome baños de inmersión según las indicaciones de su médico. °· Utilice un sostén que le ajuste bien y que brinde buen soporte a sus mamas. °· Limite el uso de bombachas de sostén o medias panty. °· Beba suficiente líquido para mantener la orina clara o de color amarillo pálido. °· Consuma todos los días alimentos ricos en fibra como cereales y panes  integrales, arroz, frijoles y frutas frescas y verduras. Estos alimentos pueden ayudarla a prevenir o aliviar el estreñimiento. °· Reanude las actividades como subir escaleras, conducir automóviles, levantar objetos pesados, hacer ejercicios o viajar cuando le indique su médico. °· Hable con su médico acerca de reanudar la actividad sexual. Volver a la actividad sexual depende del riesgo de infección, la velocidad de la curación y la comodidad y su deseo de reanudarla. °· Trate de que alguien la ayude con las actividades del hogar y con el recién nacido al menos durante algunos días después de salir del hospital. °· Descanse todo lo que pueda. Trate de descansar o tomar una siesta mientras el bebé está durmiendo. °· Aumente sus actividades gradualmente. °· Cumpla con todos los controles programados para después del parto. Es muy importante asistir a todas las visitas de control programadas. En estas visitas, su médico va a controlarla para asegurarse de que esté sanando física y emocionalmente. °SOLICITE ATENCIÓN MÉDICA SI:  °· Elimina coágulos grandes por la vagina. Guarde algunos coágulos para mostrarle al médico. °· Tiene una secreción con feo olor que proviene de la vagina. °· Tiene dificultad para orinar. °· Orina con frecuencia. °· Siente dolor al orinar. °· Nota un cambio en sus movimientos intestinales. °· Aumenta el enrojecimiento, el dolor o la hinchazón en la zona de la incisión. °· Observa que supura pus en la incisión. °· La incisión se abre. °· Sus mamas le duelen, están duras o enrojecidas. °· Sufre un dolor intenso de cabeza. °· Tiene visión borrosa o ve manchas. °· Se siente triste o deprimida. °· Tiene pensamientos acerca de lastimarse o dañar al   recién nacido. °· Tiene preguntas acerca de su cuidado, la atención del recién nacido o acerca de los medicamentos. °· Se siente mareada o sufre un desmayo. °· Tiene una erupción. °· Siente dolor u observa enrojecimiento o hinchazón en el sitio en que  estaba la vía intravenosa (IV). °· Tiene náuseas o vómitos. °· Usted dejó de amamantar al bebé y no ha tenido su período menstrual dentro de las 12 semanas siguientes. °· No amamanta al bebé y no tuvo su período menstrual en las últimas 12 semanas. °· Tiene fiebre. °SOLICITE ATENCIÓN MÉDICA DE INMEDIATO SI:  °· Siente dolor persistente. °· Siente dolor en el pecho. °· Le falta el aire. °· Se desmaya. °· Siente dolor en la pierna. °· Siente dolor en el estómago. °· El sangrado vaginal satura dos o más apósitos en 1 hora. °ASEGÚRESE DE QUE:  °· Comprende estas instrucciones. °· Controlará su enfermedad. °· Recibirá ayuda de inmediato si no mejora o si empeora. °  °Esta información no tiene como fin reemplazar el consejo del médico. Asegúrese de hacerle al médico cualquier pregunta que tenga. °  °Document Released: 11/11/2005 Document Revised: 12/02/2014 °Elsevier Interactive Patient Education ©2016 [employer] ° °

## 2016-04-16 LAB — GC/CHLAMYDIA PROBE AMP (~~LOC~~) NOT AT ARMC
CHLAMYDIA, DNA PROBE: NEGATIVE
NEISSERIA GONORRHEA: NEGATIVE

## 2016-04-17 LAB — CULTURE, BETA STREP (GROUP B ONLY)

## 2016-04-19 ENCOUNTER — Encounter (HOSPITAL_COMMUNITY): Payer: Self-pay

## 2016-04-19 NOTE — Pre-Procedure Instructions (Signed)
[id number] interpreter number

## 2016-04-23 ENCOUNTER — Encounter (HOSPITAL_COMMUNITY)
Admission: AD | Disposition: A | Discharge status: Home / Self Care | Payer: Self-pay | Source: Ambulatory Visit | Attending: Obstetrics and Gynecology

## 2016-04-23 ENCOUNTER — Ambulatory Visit (HOSPITAL_COMMUNITY)
Admission: RE | Admit: 2016-04-23 | Discharge: 2016-04-23 | Disposition: A | Discharge status: Home / Self Care | Payer: Medicaid Other | Source: Ambulatory Visit | Attending: Student | Admitting: Student

## 2016-04-23 ENCOUNTER — Other Ambulatory Visit (HOSPITAL_COMMUNITY): Payer: Self-pay | Admitting: Maternal and Fetal Medicine

## 2016-04-23 ENCOUNTER — Ambulatory Visit (INDEPENDENT_AMBULATORY_CARE_PROVIDER_SITE_OTHER): Payer: Self-pay | Admitting: Family

## 2016-04-23 ENCOUNTER — Inpatient Hospital Stay (HOSPITAL_COMMUNITY): Payer: Medicaid Other | Admitting: Anesthesiology

## 2016-04-23 ENCOUNTER — Encounter (HOSPITAL_COMMUNITY): Payer: Self-pay

## 2016-04-23 ENCOUNTER — Ambulatory Visit (HOSPITAL_COMMUNITY): Payer: Self-pay

## 2016-04-23 ENCOUNTER — Other Ambulatory Visit: Payer: Self-pay | Admitting: Advanced Practice Midwife

## 2016-04-23 ENCOUNTER — Inpatient Hospital Stay (HOSPITAL_COMMUNITY)
Admission: AD | Admit: 2016-04-23 | Discharge: 2016-04-26 | DRG: 765 | Disposition: A | Discharge status: Home / Self Care | Payer: Medicaid Other | Source: Ambulatory Visit | Attending: Obstetrics and Gynecology | Admitting: Obstetrics and Gynecology

## 2016-04-23 VITALS — BP 147/72 | HR 79 | Wt 266.5 lb

## 2016-04-23 DIAGNOSIS — O288 Other abnormal findings on antenatal screening of mother: Secondary | ICD-10-CM

## 2016-04-23 DIAGNOSIS — O0993 Supervision of high risk pregnancy, unspecified, third trimester: Secondary | ICD-10-CM

## 2016-04-23 DIAGNOSIS — O321XX0 Maternal care for breech presentation, not applicable or unspecified: Secondary | ICD-10-CM | POA: Diagnosis present

## 2016-04-23 DIAGNOSIS — Z3A37 37 weeks gestation of pregnancy: Secondary | ICD-10-CM

## 2016-04-23 DIAGNOSIS — O24425 Gestational diabetes mellitus in childbirth, controlled by oral hypoglycemic drugs: Secondary | ICD-10-CM | POA: Diagnosis present

## 2016-04-23 DIAGNOSIS — O3663X0 Maternal care for excessive fetal growth, third trimester, not applicable or unspecified: Secondary | ICD-10-CM | POA: Diagnosis present

## 2016-04-23 DIAGNOSIS — O24415 Gestational diabetes mellitus in pregnancy, controlled by oral hypoglycemic drugs: Secondary | ICD-10-CM

## 2016-04-23 DIAGNOSIS — O09523 Supervision of elderly multigravida, third trimester: Secondary | ICD-10-CM

## 2016-04-23 DIAGNOSIS — O34219 Maternal care for unspecified type scar from previous cesarean delivery: Secondary | ICD-10-CM

## 2016-04-23 DIAGNOSIS — O134 Gestational [pregnancy-induced] hypertension without significant proteinuria, complicating childbirth: Secondary | ICD-10-CM | POA: Diagnosis present

## 2016-04-23 DIAGNOSIS — Z841 Family history of disorders of kidney and ureter: Secondary | ICD-10-CM | POA: Diagnosis not present

## 2016-04-23 DIAGNOSIS — O99214 Obesity complicating childbirth: Secondary | ICD-10-CM | POA: Diagnosis present

## 2016-04-23 DIAGNOSIS — Z8249 Family history of ischemic heart disease and other diseases of the circulatory system: Secondary | ICD-10-CM | POA: Diagnosis not present

## 2016-04-23 DIAGNOSIS — O99213 Obesity complicating pregnancy, third trimester: Secondary | ICD-10-CM | POA: Diagnosis present

## 2016-04-23 DIAGNOSIS — Z6841 Body Mass Index (BMI) 40.0 and over, adult: Secondary | ICD-10-CM | POA: Diagnosis not present

## 2016-04-23 DIAGNOSIS — O139 Gestational [pregnancy-induced] hypertension without significant proteinuria, unspecified trimester: Secondary | ICD-10-CM | POA: Diagnosis present

## 2016-04-23 DIAGNOSIS — O34211 Maternal care for low transverse scar from previous cesarean delivery: Secondary | ICD-10-CM | POA: Diagnosis present

## 2016-04-23 DIAGNOSIS — Z98891 History of uterine scar from previous surgery: Secondary | ICD-10-CM

## 2016-04-23 DIAGNOSIS — Z820 Family history of epilepsy and other diseases of the nervous system: Secondary | ICD-10-CM

## 2016-04-23 DIAGNOSIS — O3661X1 Maternal care for excessive fetal growth, first trimester, fetus 1: Secondary | ICD-10-CM

## 2016-04-23 LAB — POCT URINALYSIS DIP (DEVICE)
Bilirubin Urine: NEGATIVE
GLUCOSE, UA: 100 mg/dL — AB
Hgb urine dipstick: NEGATIVE
Ketones, ur: NEGATIVE mg/dL
Leukocytes, UA: NEGATIVE
Nitrite: NEGATIVE
PH: 6 (ref 5.0–8.0)
PROTEIN: 100 mg/dL — AB
Specific Gravity, Urine: 1.025 (ref 1.005–1.030)
UROBILINOGEN UA: 4 mg/dL — AB (ref 0.0–1.0)

## 2016-04-23 LAB — GLUCOSE, CAPILLARY
GLUCOSE-CAPILLARY: 111 mg/dL — AB (ref 65–99)
GLUCOSE-CAPILLARY: 74 mg/dL (ref 65–99)
GLUCOSE-CAPILLARY: 89 mg/dL (ref 65–99)
Glucose-Capillary: 51 mg/dL — ABNORMAL LOW (ref 65–99)
Glucose-Capillary: 55 mg/dL — ABNORMAL LOW (ref 65–99)
Glucose-Capillary: 113 mg/dL — ABNORMAL HIGH (ref 65–99)

## 2016-04-23 LAB — CBC
HEMATOCRIT: 33.4 % — AB (ref 36.0–46.0)
HEMOGLOBIN: 10.5 g/dL — AB (ref 12.0–15.0)
MCH: 21.7 pg — AB (ref 26.0–34.0)
MCHC: 31.4 g/dL (ref 30.0–36.0)
MCV: 69.2 fL — AB (ref 78.0–100.0)
Platelets: 399 10*3/uL (ref 150–400)
RBC: 4.83 MIL/uL (ref 3.87–5.11)
RDW: 16.1 % — ABNORMAL HIGH (ref 11.5–15.5)
WBC: 12.9 10*3/uL — ABNORMAL HIGH (ref 4.0–10.5)

## 2016-04-23 LAB — COMPREHENSIVE METABOLIC PANEL
ALBUMIN: 2.7 g/dL — AB (ref 3.5–5.0)
ALK PHOS: 238 U/L — AB (ref 38–126)
ALT: 19 U/L (ref 14–54)
AST: 22 U/L (ref 15–41)
Anion gap: 9 (ref 5–15)
BUN: 8 mg/dL (ref 6–20)
CALCIUM: 8.5 mg/dL — AB (ref 8.9–10.3)
CHLORIDE: 108 mmol/L (ref 101–111)
CO2: 21 mmol/L — AB (ref 22–32)
CREATININE: 0.46 mg/dL (ref 0.44–1.00)
GFR calc Af Amer: >60 mL/min (ref >60)
GFR calc non Af Amer: >60 mL/min (ref >60)
GLUCOSE: 56 mg/dL — AB (ref 65–99)
Potassium: 3.5 mmol/L (ref 3.5–5.1)
SODIUM: 138 mmol/L (ref 135–145)
Total Bilirubin: 0.6 mg/dL (ref 0.3–1.2)
Total Protein: 7.1 g/dL (ref 6.5–8.1)

## 2016-04-23 LAB — PREPARE RBC (CROSSMATCH)

## 2016-04-23 SURGERY — Surgical Case
Anesthesia: Spinal | Site: Abdomen | Wound class: Clean Contaminated

## 2016-04-23 MED ORDER — FENTANYL CITRATE (PF) 100 MCG/2ML IJ SOLN: 25.0000 ug | INTRAMUSCULAR | Status: DC | PRN

## 2016-04-23 MED ORDER — METOCLOPRAMIDE HCL 5 MG/ML IJ SOLN: 10.0000 mg | Freq: Once | INTRAMUSCULAR | Status: DC | PRN

## 2016-04-23 MED ORDER — HYDRALAZINE HCL 20 MG/ML IJ SOLN: 10.0000 mg | Freq: Once | INTRAMUSCULAR | Status: DC | PRN

## 2016-04-23 MED ORDER — FENTANYL CITRATE (PF) 100 MCG/2ML IJ SOLN
INTRAMUSCULAR | Status: DC | PRN
  Administered 2016-04-23: 12.5 ug via INTRATHECAL

## 2016-04-23 MED ORDER — BUPIVACAINE IN DEXTROSE 0.75-8.25 % IT SOLN
INTRATHECAL | Status: DC | PRN
  Administered 2016-04-23: 1.5 mL via INTRATHECAL

## 2016-04-23 MED ORDER — OXYTOCIN BOLUS FROM INFUSION: 500.0000 mL | INTRAVENOUS | Status: DC

## 2016-04-23 MED ORDER — LACTATED RINGERS IV SOLN
INTRAVENOUS | Status: DC
  Administered 2016-04-23: 21:00:00 via INTRAVENOUS

## 2016-04-23 MED ORDER — SCOPOLAMINE 1 MG/3DAYS TD PT72
MEDICATED_PATCH | TRANSDERMAL | Status: DC | PRN
  Administered 2016-04-23: 1 via TRANSDERMAL

## 2016-04-23 MED ORDER — SOD CITRATE-CITRIC ACID 500-334 MG/5ML PO SOLN
30.0000 mL | ORAL | Status: DC | PRN
  Filled 2016-04-23: qty 15

## 2016-04-23 MED ORDER — LACTATED RINGERS IV SOLN: 500.0000 mL | INTRAVENOUS | Status: DC | PRN

## 2016-04-23 MED ORDER — DEXTROSE-NACL 5-0.45 % IV SOLN
INTRAVENOUS | Status: DC
  Administered 2016-04-23 (×2): via INTRAVENOUS

## 2016-04-23 MED ORDER — OXYTOCIN 40 UNITS IN LACTATED RINGERS INFUSION - SIMPLE MED: 2.5000 [IU]/h | INTRAVENOUS | Status: DC

## 2016-04-23 MED ORDER — OXYTOCIN 10 UNIT/ML IJ SOLN
40.0000 [IU] | INTRAVENOUS | Status: DC | PRN
  Administered 2016-04-23: 40 [IU] via INTRAVENOUS

## 2016-04-23 MED ORDER — LABETALOL HCL 5 MG/ML IV SOLN: 20.0000 mg | INTRAVENOUS | Status: DC | PRN

## 2016-04-23 MED ORDER — ACETAMINOPHEN 325 MG PO TABS: 650.0000 mg | ORAL_TABLET | ORAL | Status: DC | PRN

## 2016-04-23 MED ORDER — MORPHINE SULFATE (PF) 0.5 MG/ML IJ SOLN
INTRAMUSCULAR | Status: DC | PRN
  Administered 2016-04-23: .1 mg via INTRATHECAL

## 2016-04-23 MED ORDER — PHENYLEPHRINE 8 MG IN D5W 100 ML (0.08MG/ML) PREMIX OPTIME
INJECTION | INTRAVENOUS | Status: DC | PRN
  Administered 2016-04-23: 30 ug/min via INTRAVENOUS

## 2016-04-23 MED ORDER — SODIUM CHLORIDE 0.9 % IR SOLN
Status: DC | PRN
  Administered 2016-04-23: 1000 mL

## 2016-04-23 MED ORDER — SOD CITRATE-CITRIC ACID 500-334 MG/5ML PO SOLN
30.0000 mL | ORAL | Status: AC
  Administered 2016-04-23: 30 mL via ORAL

## 2016-04-23 MED ORDER — LIDOCAINE HCL (PF) 1 % IJ SOLN: 30.0000 mL | INTRAMUSCULAR | Status: DC | PRN

## 2016-04-23 MED ORDER — DEXTROSE 50 % IV SOLN
25.0000 mL | Freq: Once | INTRAVENOUS | Status: AC
  Administered 2016-04-23: 25 mL via INTRAVENOUS
  Filled 2016-04-23: qty 50

## 2016-04-23 MED ORDER — LACTATED RINGERS IV SOLN: INTRAVENOUS | Status: DC

## 2016-04-23 MED ORDER — CEFAZOLIN SODIUM-DEXTROSE 2-4 GM/100ML-% IV SOLN
2.0000 g | INTRAVENOUS | Status: AC
  Administered 2016-04-23: 2 g via INTRAVENOUS
  Filled 2016-04-23: qty 100

## 2016-04-23 MED ORDER — ONDANSETRON HCL 4 MG/2ML IJ SOLN
4.0000 mg | Freq: Four times a day (QID) | INTRAMUSCULAR | Status: DC | PRN
  Administered 2016-04-23: 4 mg via INTRAVENOUS

## 2016-04-23 MED ORDER — MEPERIDINE HCL 25 MG/ML IJ SOLN: 6.2500 mg | INTRAMUSCULAR | Status: DC | PRN

## 2016-04-23 MED ORDER — LABETALOL HCL 5 MG/ML IV SOLN
INTRAVENOUS | Status: AC
  Filled 2016-04-23: qty 4

## 2016-04-23 SURGICAL SUPPLY — 30 items
CLAMP CORD UMBIL (MISCELLANEOUS) IMPLANT
CONTAINER PREFILL 10% NBF 15ML (MISCELLANEOUS) IMPLANT
DRESSING DISP NPWT PICO 4X12 (MISCELLANEOUS) ×3 IMPLANT
DRSG OPSITE POSTOP 4X10 (GAUZE/BANDAGES/DRESSINGS) ×3 IMPLANT
DURAPREP 26ML APPLICATOR (WOUND CARE) ×3 IMPLANT
ELECT REM PT RETURN 9FT ADLT (ELECTROSURGICAL) ×3
ELECTRODE REM PT RTRN 9FT ADLT (ELECTROSURGICAL) ×1 IMPLANT
EXTRACTOR VACUUM M CUP 4 TUBE (SUCTIONS) IMPLANT
EXTRACTOR VACUUM M CUP 4' TUBE (SUCTIONS)
GLOVE BIOGEL PI IND STRL 6.5 (GLOVE) ×1 IMPLANT
GLOVE BIOGEL PI IND STRL 7.0 (GLOVE) ×1 IMPLANT
GLOVE BIOGEL PI INDICATOR 6.5 (GLOVE) ×2
GLOVE BIOGEL PI INDICATOR 7.0 (GLOVE) ×2
GLOVE SURG SS PI 6.0 STRL IVOR (GLOVE) ×3 IMPLANT
GOWN STRL REUS W/TWL LRG LVL3 (GOWN DISPOSABLE) ×6 IMPLANT
KIT ABG SYR 3ML LUER SLIP (SYRINGE) ×3 IMPLANT
NEEDLE HYPO 25X5/8 SAFETYGLIDE (NEEDLE) ×3 IMPLANT
NS IRRIG 1000ML POUR BTL (IV SOLUTION) ×3 IMPLANT
PACK C SECTION WH (CUSTOM PROCEDURE TRAY) ×3 IMPLANT
PAD OB MATERNITY 4.3X12.25 (PERSONAL CARE ITEMS) ×3 IMPLANT
PENCIL SMOKE EVAC W/HOLSTER (ELECTROSURGICAL) ×3 IMPLANT
RTRCTR C-SECT PINK 25CM LRG (MISCELLANEOUS) ×3 IMPLANT
SEPRAFILM MEMBRANE 5X6 (MISCELLANEOUS) IMPLANT
SUT PLAIN 0 NONE (SUTURE) IMPLANT
SUT PLAIN 2 0 XLH (SUTURE) ×3 IMPLANT
SUT VIC AB 0 CT1 36 (SUTURE) ×12 IMPLANT
SUT VIC AB 4-0 KS 27 (SUTURE) ×3 IMPLANT
SUT VICRYL 0 TIES 12 18 (SUTURE) ×3 IMPLANT
TOWEL OR 17X24 6PK STRL BLUE (TOWEL DISPOSABLE) ×3 IMPLANT
TRAY FOLEY CATH SILVER 14FR (SET/KITS/TRAYS/PACK) ×3 IMPLANT

## 2016-04-23 NOTE — Transfer of Care (Signed)
Immediate Anesthesia Transfer of Care Note  Patient: [name]  Procedure(s) Performed: Procedure(s): CESAREAN SECTION (N/A)  Patient Location: PACU  Anesthesia Type:Spinal  Level of Consciousness: awake, alert  and oriented  Airway & Oxygen Therapy: Patient Spontanous Breathing  Post-op Assessment: Report given to RN and Post -op Vital signs reviewed and stable  Post vital signs: Reviewed and stable  Last Vitals:  Filed Vitals:   04/23/16 1927  Temp: 36.9 C    Last Pain:  Filed Vitals:   04/23/16 2131  PainSc: 5       Patients Stated Pain Goal: 6 (04/23/16 1907)  Complications: No apparent anesthesia complications

## 2016-04-23 NOTE — Progress Notes (Signed)
[employer] video interpreter Cephus Slaterelly # [id number] used for encounter. US for growth and BPP today.  Pt reports further decreased FM since last visit - including 2 days when it was only once or twice.  Repeat C/S scheduled 6/10

## 2016-04-23 NOTE — Progress Notes (Signed)
I assisted ArchitectN Amber with admission questions, by Orlan LeavensViria Alvarez Spanish Interpreter.

## 2016-04-23 NOTE — Op Note (Signed)
Maria Daniel PROCEDURE DATE: 04/23/2016  PREOPERATIVE DIAGNOSIS: Intrauterine pregnancy at  5958w3d weeks gestation; with gestational hypertension  POSTOPERATIVE DIAGNOSIS: The same  PROCEDURE:     Cesarean Section  SURGEON:  Dr. Catalina AntiguaPeggy Emit Kuenzel  ASSISTANT: Dr. Shonna ChockNoah Wouk  INDICATIONS: Maria Daniel is a 35 y.o. [id number] at 2558w3d scheduled for cesarean section secondary to gestational hypertension.  The risks of cesarean section discussed with the patient included but were not limited to: bleeding which may require transfusion or reoperation; infection which may require antibiotics; injury to bowel, bladder, ureters or other surrounding organs; injury to the fetus; need for additional procedures including hysterectomy in the event of a life-threatening hemorrhage; placental abnormalities wth subsequent pregnancies, incisional problems, thromboembolic phenomenon and other postoperative/anesthesia complications. The patient concurred with the proposed plan, giving informed written consent for the procedure.    FINDINGS:  Viable female infant in frank breech presentation.  Clear amniotic fluid.  Intact placenta, three vessel cord.  Normal uterus, fallopian tubes and ovaries bilaterally.  ANESTHESIA:    Spinal INTRAVENOUS FLUIDS:1700 ml ESTIMATED BLOOD LOSS: 600 ml URINE OUTPUT:  100 ml SPECIMENS: Placenta sent to pathology COMPLICATIONS: None immediate  PROCEDURE IN DETAIL:  The patient received intravenous antibiotics and had sequential compression devices applied to her lower extremities while in the preoperative area.  She was then taken to the operating room where anesthesia was induced and was found to be adequate. A foley catheter was placed into her bladder and attached to Liborio Saccente gravity. She was then placed in a dorsal supine position with a leftward tilt, and prepped and draped in a sterile manner. After an adequate timeout was performed, a Pfannenstiel skin incision was  made with scalpel and carried through to the underlying layer of fascia. The fascia was incised in the midline and this incision was extended bilaterally using the Mayo scissors. Kocher clamps were applied to the superior aspect of the fascial incision and the underlying rectus muscles were dissected off bluntly. A similar process was carried out on the inferior aspect of the facial incision. The rectus muscles were separated in the midline bluntly and the peritoneum was entered bluntly. Omental adhesions noted to the anterior abdominal wall overlying the lower uterine segment. Lysis of adhesions was carried out with 2 Kelly clamps, transection of omental adhesion and suture ligation. Excellent hemostasis was noted. The Alexis self-retaining retractor was introduced into the abdominal cavity. Attention was turned to the lower uterine segment where a bladder flap was created, and a transverse hysterotomy was made with a scalpel and extended bilaterally bluntly. The infant was successfully delivered, and cord was clamped and cut and infant was handed over to awaiting neonatology team. Uterine massage was then administered and the placenta delivered intact with three-vessel cord. The uterus was cleared of clot and debris.  The hysterotomy was closed with 0 Vicryl in a running locked fashion, and an imbricating layer was also placed with a 0 Vicryl. Overall, excellent hemostasis was noted. The pelvis copiously irrigated and cleared of all clot and debris. Hemostasis was confirmed on all surfaces.  The peritoneum and the muscles were reapproximated using 0 vicryl interrupted stitches. The fascia was then closed using 0 Vicryl in a running fashion.  The subcutaneous layer was reapproximated with plain gut and the skin was closed in a subcuticular fashion using 3.0 Vicryl. The patient tolerated the procedure well. Sponge, lap, instrument and needle counts were correct x 2. She was taken to the recovery room in stable  condition. A  PICO wound vac was applied over the incision.   Advit Trethewey,PEGGYMD  04/23/2016 9:51 PM

## 2016-04-23 NOTE — Progress Notes (Signed)
Fasting PPB PPL PPD  72-105, 2/8 abnl 115-141, 7/8 abnl all abnl 119-161, all abnl   Subjective:  Baird Lyonsrma Serrano-Maldonado is a 35 y.o. [id number] at 2763w3d being seen today for ongoing prenatal care.  She is currently monitored for the following issues for this low-risk pregnancy and has History of C-section; Borderline hypertension; Supervision of high risk pregnancy, antepartum; History of gestational diabetes in prior pregnancy, currently pregnant in third trimester; Obesity affecting pregnancy in third trimester, antepartum; Gestational diabetes mellitus (GDM) in third trimester controlled on oral hypoglycemic drug; and LGA (large for gestational age) fetus affecting management of mother on her problem list.  Patient reports decreased fetal movement.  Contractions: Not present. Vag. Bleeding: None.  Movement: (!) Decreased. Denies leaking of fluid.   The following portions of the patient's history were reviewed and updated as appropriate: allergies, current medications, past family history, past medical history, past social history, past surgical history and problem list. Problem list updated.  Objective:   Filed Vitals:   04/23/16 1414  BP: 147/72  Pulse: 79  Weight: 266 lb 8 oz (120.884 kg)    Fetal Status: Fetal Heart Rate (bpm): NST-NR   Movement: (!) Decreased     General:  Alert, oriented and cooperative. Patient is in no acute distress.  Skin: Skin is warm and dry. No rash noted.   Cardiovascular: Normal heart rate noted  Respiratory: Normal respiratory effort, no problems with respiration noted  Abdomen: Soft, gravid, appropriate for gestational age. Pain/Pressure: Present     Pelvic: Vag. Bleeding: None     Cervical exam deferred        Extremities: Normal range of motion.  Edema: Mild pitting, slight indentation  Mental Status: Normal mood and affect. Normal behavior. Normal judgment and thought content.   Urinalysis: Urine Protein: 2+ Urine Glucose: 2+  Assessment and Plan:   Pregnancy: [id number] at 7163w3d  1. Gestational diabetes mellitus (GDM) in third trimester controlled on oral hypoglycemic drug - Fetal nonstress test > NR - US MFM FETAL BPP WO NON STRESS; Future - Growth US today  2. Supervision of high risk pregnancy, antepartum, third trimester - Possible csection today or tomorrow pending BPP results today  3. Obesity affecting pregnancy in third trimester, antepartum - Growth ultrasound today  4. LGA (large for gestational age) fetus affecting management of mother, first trimester, fetus 1 - Growth ultrasound today  5. History of C-section - Possible csection today or tomorrow pending BPP result  6.  Gestational Hypertension - Reviewed record with Dr. Jolayne Pantheronstant; due to history of two prior csections, if BPP wnl schedule repeat csection tomorrow; if abnormal will do today.   - MFM to call Dr. Jolayne Pantheronstant at [phone] with result of BPP   Please refer to After Visit Summary for other counseling recommendations.   Eino FarberWalidah Kennith GainN Karim, CNM

## 2016-04-23 NOTE — Anesthesia Preprocedure Evaluation (Signed)
Anesthesia Evaluation  Patient identified by MRN, date of birth, ID band Patient awake    Reviewed: Allergy & Precautions, NPO status , Patient's Chart, lab work & pertinent test results  Airway Mallampati: II  TM Distance: >3 FB Neck ROM: Full    Dental no notable dental hx.    Pulmonary neg pulmonary ROS,    Pulmonary exam normal breath sounds clear to auscultation       Cardiovascular hypertension, Normal cardiovascular exam Rhythm:Regular Rate:Normal     Neuro/Psych negative neurological ROS  negative psych ROS   GI/Hepatic negative GI ROS, Neg liver ROS,   Endo/Other  diabetes, Type 2, Oral Hypoglycemic AgentsMorbid obesity  Renal/GU negative Renal ROS  negative genitourinary   Musculoskeletal negative musculoskeletal ROS (+)   Abdominal   Peds negative pediatric ROS (+)  Hematology negative hematology ROS (+)   Anesthesia Other Findings   Reproductive/Obstetrics (+) Pregnancy                             Anesthesia Physical Anesthesia Plan  ASA: III  Anesthesia Plan: Spinal   Post-op Pain Management:    Induction:   Airway Management Planned: Natural Airway  Additional Equipment:   Intra-op Plan:   Post-operative Plan:   Informed Consent: I have reviewed the patients History and Physical, chart, labs and discussed the procedure including the risks, benefits and alternatives for the proposed anesthesia with the patient or authorized representative who has indicated his/her understanding and acceptance.   Dental advisory given  Plan Discussed with: CRNA  Anesthesia Plan Comments:         Anesthesia Quick Evaluation

## 2016-04-23 NOTE — Anesthesia Procedure Notes (Signed)
Spinal Patient location during procedure: OR Staffing Anesthesiologist: Lakasha Mcfall Performed by: anesthesiologist  Preanesthetic Checklist Completed: patient identified, site marked, surgical consent, pre-op evaluation, timeout performed, IV checked, risks and benefits discussed and monitors and equipment checked Spinal Block Patient position: sitting Prep: ChloraPrep Patient monitoring: heart rate, continuous pulse ox and blood pressure Approach: midline Location: L3-4 Injection technique: single-shot Needle Needle type: Sprotte  Needle gauge: 24 G Needle length: 9 cm Additional Notes Expiration date of kit checked and confirmed. Patient tolerated procedure well, without complications.     

## 2016-04-23 NOTE — H&P (Signed)
LABOR AND DELIVERY ADMISSION HISTORY AND PHYSICAL NOTE  Baird Lyonsrma Serrano-Maldonado is a 35 y.o. female [phone] with IUP at 333w3d by 7 wk u/s presenting for 6/10 bpp.  GHTN diagnosed today. BP elevated at visit last week and again today. NO ha, vision change, ruq/epigastric pain, or sob.   BPP today 6/10 (normal fluid).    She reports positive fetal movement. She denies leakage of fluid or vaginal bleeding.  Prenatal History/Complications:  Past Medical History: Past Medical History  Diagnosis Date  . Miscarriage   . Hypertension     no meds patient denies  . Diabetes mellitus without complication (HCC)   . Gestational diabetes   . Knee pain   . Complication of anesthesia     had trouble with spinal not working with last cs does not want a learner to do spinal    Past Surgical History: Past Surgical History  Procedure Laterality Date  . Cesarean section    . Cesarean section  08/17/2012    Procedure: CESAREAN SECTION;  Surgeon: Lesly DukesKelly H Leggett, MD;  Location: [facility];  Service: Obstetrics;  Laterality: N/A;    Obstetrical History: OB History    Gravida Para Term Preterm AB TAB SAB Ectopic Multiple Living   5 2 2  2  2   2       Social History: Social History   Social History  . Marital Status: Significant Other    Spouse Name: N/A  . Number of Children: N/A  . Years of Education: N/A   Social History Main Topics  . Smoking status: Never Smoker   . Smokeless tobacco: Never Used  . Alcohol Use: No     Comment: socially  . Drug Use: No  . Sexual Activity: Yes    Birth Control/ Protection: Implant   Other Topics Concern  . None   Social History Narrative    Family History: Family History  Problem Relation Age of Onset  . Hypertension Mother   . Kidney disease Father   . Multiple sclerosis Father     Allergies: No Known Allergies  Prescriptions prior to admission  Medication Sig Dispense Refill Last Dose  . glyBURIDE (DIABETA) 2.5 MG tablet Take 1  tablet (2.5 mg total) by mouth 2 (two) times daily with a meal. 60 tablet 3 04/23/2016 at Unknown time  . metFORMIN (GLUCOPHAGE) 1000 MG tablet Take 1 tablet (1,000 mg total) by mouth 2 (two) times daily with a meal. 60 tablet 1 04/23/2016 at Unknown time  . Prenatal Vit-Fe Fumarate-FA (PRENATAL MULTIVITAMIN) TABS tablet Take 1 tablet by mouth every evening.   04/22/2016 at Unknown time  . oxyCODONE-acetaminophen (PERCOCET/ROXICET) 5-325 MG tablet Take 2 tablets by mouth once. (Patient not taking: Reported on 04/01/2016) 30 tablet 0 Not Taking     Review of Systems   All systems reviewed and negative except as stated in HPI  Last menstrual period 08/13/2015. General appearance: alert, cooperative and appears stated age Lungs: clear to auscultation bilaterally Heart: regular rate and rhythm Abdomen: soft, non-tender; bowel sounds normal Extremities: No calf swelling or tenderness Fetal monitoring: 140/mod/-a/-d Uterine activity: quiet      Prenatal labs: ABO, Rh: O/POS/-- (10/19 1050) Antibody: NEG (10/19 1050) Rubella: !Error!imm RPR: NON REAC (03/28 1537)  HBsAg: NEGATIVE (10/19 1050)  HIV: NONREACTIVE (03/28 1537)  GBS:   neg 1 hr Glucola: 224 Genetic screening:  Quad wnl Anatomy US: wnl  Prenatal Transfer Tool  Maternal Diabetes: Yes:  Diabetes Type:  Insulin/Medication controlled Genetic  Screening: Normal Maternal Ultrasounds/Referrals: Normal Fetal Ultrasounds or other Referrals:  None Maternal Substance Abuse:  No Significant Maternal Medications:  Meds include: Zantac Other: glyburide, metformin Significant Maternal Lab Results: Lab values include: Group B Strep negative  Results for orders placed or performed in visit on 04/23/16 (from the past 24 hour(s))  POCT urinalysis dip (device)   Collection Time: 04/23/16  2:24 PM  Result Value Ref Range   Glucose, UA 100 (A) NEGATIVE mg/dL   Bilirubin Urine NEGATIVE NEGATIVE   Ketones, ur NEGATIVE NEGATIVE mg/dL    Specific Gravity, Urine 1.025 1.005 - 1.030   Hgb urine dipstick NEGATIVE NEGATIVE   pH 6.0 5.0 - 8.0   Protein, ur 100 (A) NEGATIVE mg/dL   Urobilinogen, UA 4.0 (H) 0.0 - 1.0 mg/dL   Nitrite NEGATIVE NEGATIVE   Leukocytes, UA NEGATIVE NEGATIVE    Patient Active Problem List   Diagnosis Date Noted  . Gestational hypertension 04/23/2016  . LGA (large for gestational age) fetus affecting management of mother 04/01/2016  . Gestational diabetes mellitus (GDM) in third trimester controlled on oral hypoglycemic drug 03/12/2016  . History of gestational diabetes in prior pregnancy, currently pregnant in third trimester 02/20/2016  . Obesity affecting pregnancy in third trimester, antepartum 02/20/2016  . Supervision of high risk pregnancy, antepartum 09/14/2015  . Borderline hypertension 04/15/2012  . History of C-section 04/14/2012    Assessment: Coralee Edberg is a 35 y.o. [id number] at 100w3d here for 6/10 bpp in setting of gestational hypertension at term. Hx c/s x2.  #Labor: admit, plan for c/s tonight. Delivery indicated given gestational age past 37 weeks and dx of gestational hypertension. C/s scheduled for tomorrow, but in setting of 6/10 bpp, think prudent to move forward with c/s tonight. The risks of cesarean section were discussed with the patient including but were not limited to: bleeding which may require transfusion or reoperation; infection which may require antibiotics; injury to bowel, bladder, ureters or other surrounding organs; injury to the fetus; need for additional procedures including hysterectomy in the event of a life-threatening hemorrhage; placental abnormalities wth subsequent pregnancies, incisional problems, thromboembolic phenomenon and other postoperative/anesthesia complications.   #FWB: Cat 1 currently #ID:  gbs neg #MOF: breast/bottle #MOC: nexplanon #Circ:  No #GHTN: asymptomatic. No severe range pressures. Labs pending. No indication for Mg or  antihypertensives at this time  Silvano Bilis 04/23/2016, 6:48 PM

## 2016-04-24 ENCOUNTER — Encounter (HOSPITAL_COMMUNITY): Payer: Self-pay | Admitting: Obstetrics and Gynecology

## 2016-04-24 ENCOUNTER — Encounter (HOSPITAL_COMMUNITY): Admission: RE | Payer: Self-pay | Source: Ambulatory Visit

## 2016-04-24 ENCOUNTER — Encounter (HOSPITAL_COMMUNITY): Payer: Self-pay | Admitting: Anesthesiology

## 2016-04-24 ENCOUNTER — Inpatient Hospital Stay (HOSPITAL_COMMUNITY): Admission: RE | Admit: 2016-04-24 | Payer: Self-pay | Source: Ambulatory Visit | Admitting: Obstetrics & Gynecology

## 2016-04-24 LAB — CBC
HEMATOCRIT: 32.9 % — AB (ref 36.0–46.0)
HEMOGLOBIN: 10.2 g/dL — AB (ref 12.0–15.0)
MCH: 21.5 pg — AB (ref 26.0–34.0)
MCHC: 31 g/dL (ref 30.0–36.0)
MCV: 69.3 fL — AB (ref 78.0–100.0)
Platelets: 384 10*3/uL (ref 150–400)
RBC: 4.75 MIL/uL (ref 3.87–5.11)
RDW: 16.1 % — ABNORMAL HIGH (ref 11.5–15.5)
WBC: 14.2 10*3/uL — ABNORMAL HIGH (ref 4.0–10.5)

## 2016-04-24 LAB — RPR: RPR Ser Ql: NONREACTIVE

## 2016-04-24 LAB — PROTEIN / CREATININE RATIO, URINE
Creatinine, Urine: 187 mg/dL
PROTEIN CREATININE RATIO: 0.4 mg/mg{creat} — AB (ref 0.00–0.15)
TOTAL PROTEIN, URINE: 75 mg/dL

## 2016-04-24 LAB — GLUCOSE, CAPILLARY: GLUCOSE-CAPILLARY: 82 mg/dL (ref 65–99)

## 2016-04-24 LAB — GLUCOSE, RANDOM: Glucose, Bld: 79 mg/dL (ref 65–99)

## 2016-04-24 SURGERY — Surgical Case
Anesthesia: *Unknown

## 2016-04-24 SURGERY — Surgical Case
Anesthesia: Regional

## 2016-04-24 MED ORDER — NALOXONE HCL 0.4 MG/ML IJ SOLN: 0.4000 mg | INTRAMUSCULAR | Status: DC | PRN

## 2016-04-24 MED ORDER — COCONUT OIL OIL: 1.0000 "application " | TOPICAL_OIL | Status: DC | PRN

## 2016-04-24 MED ORDER — MENTHOL 3 MG MT LOZG: 1.0000 | LOZENGE | OROMUCOSAL | Status: DC | PRN

## 2016-04-24 MED ORDER — SIMETHICONE 80 MG PO CHEW
80.0000 mg | CHEWABLE_TABLET | ORAL | Status: DC | PRN
  Administered 2016-04-25: 80 mg via ORAL
  Filled 2016-04-24: qty 1

## 2016-04-24 MED ORDER — LACTATED RINGERS IV SOLN: INTRAVENOUS | Status: DC

## 2016-04-24 MED ORDER — ACETAMINOPHEN 500 MG PO TABS: 1000.0000 mg | ORAL_TABLET | Freq: Once | ORAL | Status: DC

## 2016-04-24 MED ORDER — NALBUPHINE HCL 10 MG/ML IJ SOLN: 5.0000 mg | Freq: Once | INTRAMUSCULAR | Status: DC | PRN

## 2016-04-24 MED ORDER — DIPHENHYDRAMINE HCL 25 MG PO CAPS: 25.0000 mg | ORAL_CAPSULE | ORAL | Status: DC | PRN

## 2016-04-24 MED ORDER — NALBUPHINE HCL 10 MG/ML IJ SOLN: 5.0000 mg | INTRAMUSCULAR | Status: DC | PRN

## 2016-04-24 MED ORDER — LACTATED RINGERS IV BOLUS (SEPSIS)
1000.0000 mL | Freq: Once | INTRAVENOUS | Status: AC
  Administered 2016-04-24: 1000 mL via INTRAVENOUS

## 2016-04-24 MED ORDER — WITCH HAZEL-GLYCERIN EX PADS: 1.0000 "application " | MEDICATED_PAD | CUTANEOUS | Status: DC | PRN

## 2016-04-24 MED ORDER — LACTATED RINGERS IV SOLN
INTRAVENOUS | Status: DC
  Administered 2016-04-24: 09:00:00 via INTRAVENOUS

## 2016-04-24 MED ORDER — SIMETHICONE 80 MG PO CHEW
80.0000 mg | CHEWABLE_TABLET | ORAL | Status: DC
  Administered 2016-04-24 – 2016-04-26 (×2): 80 mg via ORAL
  Filled 2016-04-24 (×2): qty 1

## 2016-04-24 MED ORDER — ACETAMINOPHEN 325 MG PO TABS
650.0000 mg | ORAL_TABLET | ORAL | Status: DC | PRN
  Administered 2016-04-24 – 2016-04-26 (×6): 650 mg via ORAL
  Filled 2016-04-24 (×6): qty 2

## 2016-04-24 MED ORDER — SCOPOLAMINE 1 MG/3DAYS TD PT72
1.0000 | MEDICATED_PATCH | Freq: Once | TRANSDERMAL | Status: DC
  Filled 2016-04-24: qty 1

## 2016-04-24 MED ORDER — KETOROLAC TROMETHAMINE 30 MG/ML IJ SOLN: 30.0000 mg | Freq: Four times a day (QID) | INTRAMUSCULAR | Status: AC | PRN

## 2016-04-24 MED ORDER — TETANUS-DIPHTH-ACELL PERTUSSIS 5-2.5-18.5 LF-MCG/0.5 IM SUSP
0.5000 mL | Freq: Once | INTRAMUSCULAR | Status: AC
  Administered 2016-04-24: 0.5 mL via INTRAMUSCULAR
  Filled 2016-04-24: qty 0.5

## 2016-04-24 MED ORDER — DIPHENHYDRAMINE HCL 50 MG/ML IJ SOLN: 12.5000 mg | INTRAMUSCULAR | Status: DC | PRN

## 2016-04-24 MED ORDER — DIPHENHYDRAMINE HCL 25 MG PO CAPS: 25.0000 mg | ORAL_CAPSULE | Freq: Four times a day (QID) | ORAL | Status: DC | PRN

## 2016-04-24 MED ORDER — SENNOSIDES-DOCUSATE SODIUM 8.6-50 MG PO TABS
2.0000 | ORAL_TABLET | ORAL | Status: DC
  Administered 2016-04-24 – 2016-04-26 (×2): 2 via ORAL
  Filled 2016-04-24 (×2): qty 2

## 2016-04-24 MED ORDER — DIBUCAINE 1 % RE OINT: 1.0000 "application " | TOPICAL_OINTMENT | RECTAL | Status: DC | PRN

## 2016-04-24 MED ORDER — OXYCODONE HCL 5 MG PO TABS
10.0000 mg | ORAL_TABLET | ORAL | Status: DC | PRN
  Administered 2016-04-25 – 2016-04-26 (×5): 10 mg via ORAL
  Filled 2016-04-24 (×5): qty 2

## 2016-04-24 MED ORDER — NALOXONE HCL 2 MG/2ML IJ SOSY
1.0000 ug/kg/h | PREFILLED_SYRINGE | INTRAVENOUS | Status: DC | PRN
  Filled 2016-04-24: qty 2

## 2016-04-24 MED ORDER — SODIUM CHLORIDE 0.9% FLUSH: 3.0000 mL | INTRAVENOUS | Status: DC | PRN

## 2016-04-24 MED ORDER — LACTATED RINGERS IV BOLUS (SEPSIS)
500.0000 mL | Freq: Once | INTRAVENOUS | Status: AC
  Administered 2016-04-24: 500 mL via INTRAVENOUS

## 2016-04-24 MED ORDER — SIMETHICONE 80 MG PO CHEW
80.0000 mg | CHEWABLE_TABLET | Freq: Three times a day (TID) | ORAL | Status: DC
  Administered 2016-04-24 – 2016-04-26 (×7): 80 mg via ORAL
  Filled 2016-04-24 (×7): qty 1

## 2016-04-24 MED ORDER — OXYTOCIN 40 UNITS IN LACTATED RINGERS INFUSION - SIMPLE MED: 2.5000 [IU]/h | INTRAVENOUS | Status: AC

## 2016-04-24 MED ORDER — ONDANSETRON HCL 4 MG/2ML IJ SOLN: 4.0000 mg | Freq: Three times a day (TID) | INTRAMUSCULAR | Status: DC | PRN

## 2016-04-24 MED ORDER — PRENATAL MULTIVITAMIN CH
1.0000 | ORAL_TABLET | Freq: Every day | ORAL | Status: DC
Start: 2016-04-24 — End: 2016-04-26
  Administered 2016-04-24 – 2016-04-26 (×3): 1 via ORAL
  Filled 2016-04-24 (×3): qty 1

## 2016-04-24 MED ORDER — OXYCODONE HCL 5 MG PO TABS
5.0000 mg | ORAL_TABLET | ORAL | Status: DC | PRN
  Administered 2016-04-24 – 2016-04-26 (×3): 5 mg via ORAL
  Filled 2016-04-24 (×3): qty 1

## 2016-04-24 MED ORDER — IBUPROFEN 600 MG PO TABS
600.0000 mg | ORAL_TABLET | Freq: Four times a day (QID) | ORAL | Status: DC
  Administered 2016-04-24 – 2016-04-26 (×9): 600 mg via ORAL
  Filled 2016-04-24 (×9): qty 1

## 2016-04-24 MED ORDER — KETOROLAC TROMETHAMINE 30 MG/ML IJ SOLN
30.0000 mg | Freq: Four times a day (QID) | INTRAMUSCULAR | Status: AC | PRN
  Administered 2016-04-24 (×2): 30 mg via INTRAVENOUS
  Filled 2016-04-24 (×2): qty 1

## 2016-04-24 NOTE — Progress Notes (Signed)
Spoke to Dr. Alvester MorinNewton. IV fell out, no need to restart at this time.  Foley will remain until patient is 24 hours post op.

## 2016-04-24 NOTE — Anesthesia Postprocedure Evaluation (Signed)
Anesthesia Post Note  Patient: Maria Daniel  Procedure(s) Performed: Procedure(s) (LRB): CESAREAN SECTION (N/A)  Patient location during evaluation: Mother Baby Anesthesia Type: Spinal Level of consciousness: awake and alert and oriented Pain management: pain level controlled Vital Signs Assessment: post-procedure vital signs reviewed and stable Respiratory status: spontaneous breathing, nonlabored ventilation and respiratory function stable Cardiovascular status: stable Postop Assessment: no headache, no backache, spinal receding, patient able to bend at knees, no signs of nausea or vomiting and adequate PO intake Anesthetic complications: no     Last Vitals:  Filed Vitals:   04/24/16 0645 04/24/16 0815  BP: 141/72 137/72  Pulse: 72 62  Temp:  36.8 C  Resp:  20    Last Pain:  Filed Vitals:   04/24/16 0832  PainSc: 4    Pain Goal: Patients Stated Pain Goal: 3 (04/24/16 0700)               Madison HickmanGREGORY,Calene Paradiso

## 2016-04-24 NOTE — Lactation Note (Signed)
This note was copied from a baby's chart. Lactation Consultation Note  Initial visit made.  Baby is currently in NICU with respiratory problems.  Mom has not desired to begin pumping until now.  Reviewed supply and demand and importance of establishing pumping as soon as possible.  Mom willing to initiate.  Symphony pump set up and instructions given on use, frequency and cleaning.  Reassured she may not see much volume in first three days.  Assisted with first pumping.  [place] [facility] [place] and [place] pump [place]ed.  Encouraged to call for assist/concerns prn.  Patient Name: Maria Daniel ZOXWR'UToday's Date: 04/24/2016 Reason for consult: Initial assessment;NICU baby   Maternal Data    Feeding    LATCH Score/Interventions                      Lactation Tools Discussed/Used WIC Program: Yes Pump Review: Setup, frequency, and cleaning;Milk Storage Initiated by:: LC Date initiated:: 04/24/16   Consult Status Consult Status: Follow-up Date: 04/25/16 Follow-up type: In-patient    Huston FoleyMOULDEN, Crystalyn Delia S 04/24/2016, 2:15 PM

## 2016-04-24 NOTE — Progress Notes (Signed)
CSW acknowledges NICU admission.    Patient screened out for psychosocial assessment since none of the following apply:  Psychosocial stressors documented in mother or baby's chart  Gestation less than 32 weeks  Code at delivery   Infant with anomalies  Please contact the Clinical Social Worker if specific needs arise, or by MOB's request.       

## 2016-04-24 NOTE — Anesthesia Postprocedure Evaluation (Signed)
Anesthesia Post Note  Patient: Maria Daniel  Procedure(s) Performed: Procedure(s) (LRB): CESAREAN SECTION (N/A)  Patient location during evaluation: PACU Anesthesia Type: Spinal Level of consciousness: oriented and awake and alert Pain management: pain level controlled Vital Signs Assessment: post-procedure vital signs reviewed and stable Respiratory status: spontaneous breathing, respiratory function stable and patient connected to nasal cannula oxygen Cardiovascular status: blood pressure returned to baseline and stable Postop Assessment: no headache and no backache Anesthetic complications: no     Last Vitals:  Filed Vitals:   04/24/16 0006 04/24/16 0015  BP:  145/70  Pulse: 65 63  Temp:    Resp: 16 15    Last Pain:  Filed Vitals:   04/24/16 0030  PainSc: 0-No pain   Pain Goal: Patients Stated Pain Goal: 6 (04/23/16 1907)               Phillips Groutarignan, Danta Baumgardner

## 2016-04-24 NOTE — Plan of Care (Signed)
Problem: Role Relationship: Goal: Ability to demonstrate positive interaction with newborn will improve Outcome: Completed/Met Date Met:  04/24/16 Infant in NICU. Appropriate bonding noted.

## 2016-04-24 NOTE — Progress Notes (Signed)
Post Partum Day 1, POD1 Subjective:  Maria Daniel is a 35 y.o. [id number] [redacted]w[redacted]d s/p RLTCS for GHTN in the setting of BPP 6/10. No acute events overnight. Pt denies nausea or vomiting. She has not tried eating or ambulating yet. Pain is well controlled. She still has the foley catheter. She has not had flatus. Lochia Minimal. Plan for birth control is Nexplanon. Method of Feeding: breast and bottle feeding.   Objective: Blood pressure 135/69, pulse 62, temperature 98.4 F (36.9 C), temperature source Oral, resp. rate 18, height 5' 4.17" (1.63 m), weight 117.935 kg (260 lb), last menstrual period 08/13/2015, SpO2 97 %, unknown if currently breastfeeding.  Physical Exam:  General: alert, cooperative and no distress Chest: normal WOB Heart: Regular rate Abdomen: +BS, soft, mild TTP (appropriate) Extremities: no edema   Recent Labs (last 2 labs)      Recent Labs  04/23/16 1914 04/24/16 0523  HGB 10.5* 10.2*  HCT 33.4* 32.9*      Assessment/Plan:  ASSESSMENT: Maria Daniel is a 35 y.o. [id number] 716w3d s/p RLTCS for GHTN in the setting of BPP 6/10.  #GHTN/Preeclampsia: BP trending down, systolic 130-150's, diastolic 19-14'N60-70's. UPC 0.4. Patient is asymptomatic.  -PRN hydralazine and labetalol  #A2GDM: fasting CBG 79 -follow up screening at 6 week postpartum visit  #Postpartum Care: Plan for discharge tomorrow Continue routine PP care Breastfeeding support PRN  Vanice SarahPeter Sheng, medical student     Resident Addendum I have separately seen and examined the patient.  I have discussed the findings and exam with the medical student and agree with the above note.  I helped develop the management plan that is described in the student's note and I agree with the content.  I have outlined my exam, assessment, and plan below: Doing well this morning. Denies any acute complaints. Has not been ambulating. Foley still in place. Lochia minimal. Plans to breast and  bottle feed. Plans for Nexplanon for birth control.  Exam: General: 35yo female resting comfortably in no apparent distress. Fundus: Firm Extremities: No edema, no signs of DVT.  Plan: Remove foley. Continue to work on ambulation. Blood pressures improving following delivery. PRN Hydralazine and PRN Labetalol ordered. Nexplanon for birth control Breast/Bottle feeding Anticipate discharge 4/1 vs. 4/2

## 2016-04-24 NOTE — Addendum Note (Signed)
Addendum  created 04/24/16 1001 by Shanon PayorSuzanne M Karnisha Lefebre, CRNA   Modules edited: Clinical Notes   Clinical Notes:  File: [id number]

## 2016-04-24 NOTE — Progress Notes (Signed)
Post Partum Day 1, POD1 Subjective:  Maria Daniel is a 35 y.o. [id number] 9653w3d s/p RLTCS for GHTN in the setting of BPP 6/10.  No acute events overnight.  Pt denies nausea or vomiting. She has not tried eating or ambulating yet. Pain is well controlled. She still has the foley catheter. She has not had flatus.  Lochia Minimal.  Plan for birth control is Nexplanon.  Method of Feeding: breast and bottle feeding.    Objective: Blood pressure 135/69, pulse 62, temperature 98.4 F (36.9 C), temperature source Oral, resp. rate 18, height 5' 4.17" (1.63 m), weight 117.935 kg (260 lb), last menstrual period 08/13/2015, SpO2 97 %, unknown if currently breastfeeding.  Physical Exam:  General: alert, cooperative and no distress Chest: normal WOB Heart: Regular rate Abdomen: +BS, soft, mild TTP (appropriate) Extremities: no edema   Recent Labs  04/23/16 1914 04/24/16 0523  HGB 10.5* 10.2*  HCT 33.4* 32.9*    Assessment/Plan:  ASSESSMENT: Maria Daniel is a 35 y.o. [id number] 8353w3d s/p RLTCS for GHTN in the setting of BPP 6/10.  #GHTN/Preeclampsia: BP trending down, systolic 130-150's, diastolic 32-44'W60-70's. UPC 0.4. Patient is asymptomatic.  -PRN hydralazine and labetalol  #A2GDM: fasting CBG 79 -follow up screening at 6 week postpartum visit  #Postpartum Care: Plan for discharge tomorrow Continue routine PP care Breastfeeding support PRN  Vanice SarahPeter Cherylann Hobday, medical student

## 2016-04-24 NOTE — Addendum Note (Signed)
Addended by: Marchelle FolksAY, Ranya Fiddler L on: 04/24/2016 08:00 AM   Modules accepted: Orders

## 2016-04-25 LAB — GLUCOSE, CAPILLARY: GLUCOSE-CAPILLARY: 112 mg/dL — AB (ref 65–99)

## 2016-04-25 NOTE — Lactation Note (Signed)
This note was copied from a baby's chart. Lactation Consultation Note  Follow up visit.  Mom obtaining drops when pumping.  Reassured that volume is normal for now and should increase over next few days.  Baby is still NPO.  Patient Name: Boy Baird Lyonsrma Serrano-Maldonado [id number]'s Date: 04/25/2016     Maternal Data    Feeding    LATCH Score/Interventions                      Lactation Tools Discussed/Used     Consult Status      Huston FoleyMOULDEN, Maddalena Linarez S 04/25/2016, 1:51 PM

## 2016-04-25 NOTE — Progress Notes (Signed)
Post Partum Day 2, POD2 Subjective:  Maria Daniel is a 35 y.o. [id number] 1148w3d s/p RLTCS for preeclampsia in the setting of BPP 6/10.  No acute events overnight. Pt reports abdominal pain and inability to pass gas. She denies problems with ambulating, PO intake, nausea, or vomiting. Plan for birth control is Nexplanon.  Method of Feeding: breast and bottle.  Objective: Blood pressure 138/61, pulse 66, temperature 98 F (36.7 C), temperature source Oral, resp. rate 20, height 5' 4.17" (1.63 m), weight 117.935 kg (260 lb), last menstrual period 08/13/2015, SpO2 98 %, unknown if currently breastfeeding.  Physical Exam:  General: alert, cooperative and no distress Chest: normal WOB Heart: Regular rate Abdomen: +BS, soft, mild TTP (appropriate) DVT Evaluation: no evidence of DVT seen on physical exam. Extremities: no edema   Recent Labs  04/23/16 1914 04/24/16 0523  HGB 10.5* 10.2*  HCT 33.4* 32.9*    Assessment/Plan:  ASSESSMENT: Maria Daniel is a 35 y.o. [id number] 5548w3d s/p RLTCS for preeclampsia in the setting of BPP 6/10. Her BP's have been stable except for one measurement of 165/85 that was normal 20 minutes later. She also has A2GDM with fasting CBG 79 yesterday and nonfasting CBG 112 today. She has been encouraged to walk around more today.  Plan for discharge tomorrow Continue routine PP care Breastfeeding support PRN  Vanice SarahPeter Omeka Holben, medical student

## 2016-04-26 MED ORDER — SENNOSIDES-DOCUSATE SODIUM 8.6-50 MG PO TABS: 2.0000 | ORAL_TABLET | ORAL | Status: DC

## 2016-04-26 MED ORDER — POLYETHYLENE GLYCOL 3350 17 GM/SCOOP PO POWD: ORAL | Status: DC

## 2016-04-26 MED ORDER — IBUPROFEN 600 MG PO TABS: 600.0000 mg | ORAL_TABLET | Freq: Four times a day (QID) | ORAL | Status: DC | PRN

## 2016-04-26 MED ORDER — OXYCODONE HCL 5 MG PO TABS: 5.0000 mg | ORAL_TABLET | ORAL | Status: DC | PRN

## 2016-04-26 NOTE — Discharge Summary (Signed)
OB Discharge Summary     Patient Name: Maria Daniel DOB: 11/05/1981 MRN: [id number]  Date of admission: 04/23/2016 Delivering MD: Catalina AntiguaONSTANT, PEGGY   Date of discharge: 04/26/2016  Admitting diagnosis: direct admit Intrauterine pregnancy: 1641w3d     Secondary diagnosis:  Active Problems:   Gestational hypertension  Additional problems: BPP 6/10     Discharge diagnosis: Term Pregnancy Delivered and Gestational Hypertension                                                                                                Post partum procedures:none  Augmentation: N/A  Complications: None  Hospital course:  Sceduled C/S   35 y.o. yo [id number] at 5041w3d was admitted to the hospital 04/23/2016 for scheduled cesarean section with the following indication:Prior C/S; BPP 6/10; new onset gHTN.  Membrane Rupture Time/Date: 9:22 PM ,04/23/2016   Patient delivered a Viable infant.04/23/2016  Details of operation can be found in separate operative note.  Pateint had an uncomplicated postpartum course.  She is ambulating, tolerating a regular diet, passing flatus, and urinating well. Patient is discharged home in stable condition on  04/26/2016          Physical exam  Filed Vitals:   04/25/16 0611 04/25/16 0630 04/25/16 1930 04/26/16 0512  BP: 165/85 138/61 127/64 144/67  Pulse: 64 66 70 72  Temp: 98 F (36.7 C)  98.2 F (36.8 C) 98.4 F (36.9 C)  TempSrc: Oral  Oral Oral  Resp: 20  20 20   Height:      Weight:      SpO2:       General: alert and cooperative Lochia: appropriate Uterine Fundus: firm Incision: PICO intact and functioning DVT Evaluation: No evidence of DVT seen on physical exam. Labs: Lab Results  Component Value Date   WBC 14.2* 04/24/2016   HGB 10.2* 04/24/2016   HCT 32.9* 04/24/2016   MCV 69.3* 04/24/2016   PLT 384 04/24/2016   CMP Latest Ref Rng 04/24/2016  Glucose 65 - 99 mg/dL 79  BUN 6 - 20 mg/dL -  Creatinine 1.300.44 - 8.651.00 mg/dL -  Sodium 784135 - 696145  mmol/L -  Potassium 3.5 - 5.1 mmol/L -  Chloride 101 - 111 mmol/L -  CO2 22 - 32 mmol/L -  Calcium 8.9 - 10.3 mg/dL -  Total Protein 6.5 - 8.1 g/dL -  Total Bilirubin 0.3 - 1.2 mg/dL -  Alkaline Phos 38 - 295126 U/L -  AST 15 - 41 U/L -  ALT 14 - 54 U/L -    Discharge instruction: per After Visit Summary and "Baby and Me Booklet".  After visit meds:    Medication List    STOP taking these medications        glyBURIDE 2.5 MG tablet  Commonly known as:  DIABETA     metFORMIN 1000 MG tablet  Commonly known as:  GLUCOPHAGE     oxyCODONE-acetaminophen 5-325 MG tablet  Commonly known as:  PERCOCET/ROXICET      TAKE these medications        ibuprofen 600 MG tablet  Commonly  known as:  ADVIL,MOTRIN  Take 1 tablet (600 mg total) by mouth every 6 (six) hours as needed.     oxyCODONE 5 MG immediate release tablet  Commonly known as:  Oxy IR/ROXICODONE  Take 1 tablet (5 mg total) by mouth every 4 (four) hours as needed (pain scale 4-7).     polyethylene glycol powder powder  Commonly known as:  GLYCOLAX/MIRALAX  Follow directions on package; take twice daily as needed for constipation     prenatal multivitamin Tabs tablet  Take 1 tablet by mouth every evening.     senna-docusate 8.6-50 MG tablet  Commonly known as:  Senokot-S  Take 2 tablets by mouth daily.        Diet: carb modified diet  Activity: Advance as tolerated. Pelvic rest for 6 weeks.   Outpatient follow up: Msg sent to clinic for 1wk appt for BP check and incision check (PICO) Follow up Appt:No future appointments. Follow up Visit:No Follow-up on file.  Postpartum contraception: Nexplanon  Newborn Data: Live born female  Birth Weight: 8 lb 15.2 oz (4060 g) APGAR: 2, 8  Baby Feeding: pumping Disposition:NICU for glucose management   04/26/2016 Cam Hai, CNM  10:22 PM

## 2016-04-26 NOTE — Discharge Instructions (Signed)
Cuidados en el postparto luego de un parto por cesrea  (Postpartum Care After Cesarean Delivery) Despus del parto (perodo de postparto), la estada normal en el hospital es de 24-72 horas. Si hubo problemas con el trabajo de parto o el parto, o si tiene otros problemas mdicos, es posible que deba permanecer en el hospital por ms tiempo.  Mientras est en el hospital, recibir ayuda e instrucciones sobre cmo cuidar de usted misma y de su beb recin nacido durante el postparto.  Mientras est en el hospital:   Es normal que sienta dolor o molestias en la incisin en el abdomen. Asegrese de decirle a las enfermeras si siente dolor, as como donde siente el dolor y qu empeora el dolor.  Si est amamantando, puede sentir contracciones dolorosas en el tero durante algunas semanas. Esto es normal. Las contracciones ayudan a que el tero vuelva a su tamao normal.  Es normal tener algo de sangrado despus del parto.  Durante los primeros 1-3 das despus del parto, el flujo es de color rojo y la cantidad puede ser similar a un perodo.  Es frecuente que el flujo se inicie y se detenga.  En los primeros das, puede eliminar algunos cogulos pequeos. Informe a las enfermeras si comienza a eliminar cogulos grandes o aumenta el flujo.  No  elimine los cogulos de sangre por el inodoro antes de que la enfermera los vea.  Durante los prximos 3 a 10 das despus del parto, el flujo debe ser ms acuoso y rosado o marrn.  De diez a catorce das despus del parto, el flujo debe ser una pequea cantidad de secrecin de color blanco amarillento.  La cantidad de flujo disminuir en las primeras semanas despus del parto. El flujo puede detenerse en 6-8 semanas. La mayora de las mujeres no tienen ms flujo a las 12 semanas despus del parto.  Usted debe cambiar sus apsitos con frecuencia.  Lvese bien las manos con agua y jabn durante al menos 20 segundos despus de cambiar el apsito, usar el  bao o antes de sostener o alimentar a su recin nacido.  Se le quitar la va intravenosa (IV) cuando ya est bebiendo suficientes lquidos.  El tubo de drenaje para la orina (catter urinario) que se inserta antes del parto puede ser retirado luego de 6-8 horas despus del parto o cuando las piernas vuelvan a tener sensibilidad. Usted puede sentir que tiene que vaciar la vejiga durante las primeras 6-8 horas despus de que le quiten el catter.  Si se siente dbil, mareada o se desmaya, llame a su enfermera antes de levantarse de la cama por primera vez y antes de tomar una ducha por primera vez.  En los primeros das despus del parto, podr sentir las mamas sensibles y llenas. Esto se llama congestin. La sensibilidad en los senos por lo general desaparece dentro de las 48-72 horas despus de que ocurre la congestin. Tambin puede notar que la leche se escapa de sus senos. Si no est amamantando no estimule sus pechos. La estimulacin de las mamas hace que sus senos produzcan ms leche.  Pasar tanto tiempo como sea posible con el beb recin nacido es muy importante. Durante este tiempo, usted y su beb deben sentirse cerca y conocerse uno al otro. Tener al beb en su habitacin (alojamiento conjunto) ayudar a fortalecer el vnculo con el beb recin nacido. Esto le dar tiempo para conocerlo y atenderlo de manera cmoda.  Las hormonas se modifican despus del parto. A   veces, los cambios hormonales pueden causar tristeza o ganas de llorar por un tiempo. Estos sentimientos no deben durar ms de unos pocos das. Si duran ms que eso, debe hablar con su mdico.  Si lo desea, hable con su mdico acerca de los mtodos de planificacin familiar o mtodos anticonceptivos.  Hable con su mdico acerca de las vacunas. El mdico puede indicarle que se aplique las siguientes vacunas antes de salir del hospital:  Vacuna contra el ttanos, la difteria y la tos ferina (Tdap) o el ttanos y la difteria (Td).  Es muy importante que usted y su familia (incluyendo a los abuelos) u otras personas que cuidan al recin nacido estn al da con las vacunas Tdap o Td. Las vacunas Tdap o Td pueden ayudar a proteger al recin nacido de enfermedades.  Inmunizacin contra la rubola.  Inmunizacin contra la varicela.  Inmunizacin contra la gripe. Usted debe recibir esta vacunacin anual si no la ha recibido durante el embarazo.   Esta informacin no tiene como fin reemplazar el consejo del mdico. Asegrese de hacerle al mdico cualquier pregunta que tenga.   Document Released: 10/28/2012 Elsevier Interactive Patient Education 2016 Elsevier Inc.  

## 2016-04-26 NOTE — Lactation Note (Signed)
This note was copied from a baby's chart. Lactation Consultation Note  Patient Name: Maria Daniel Lyonsrma Serrano-Maldonado [id number]'s Date: 04/26/2016 Reason for consult: Follow-up assessment   With this mom of an early term baby, IDM, LGA and hypoglycemic. Mom is being discharged to home today. Mom encouraged to go to NICU and hold baby during every 3 hours feeding times. The baby is being gavage fed due to tachypnea.  I loaned a DEP to mom, and she has a [facility] appointment for Monday to get DEP. Mom's milk is transitioning in, and she is now 64 hours post partum. She breast fed her first child for 1 year. Pumping frequency and duration reviewed with mom, and to follow each with hand expression. Mom expressed 9 ml's this morning, and about 1-2 ml's this afternoon. Mom to go to NICU before going home, to hold her baby. Mom knows to call lactation for questions/concerns.    Maternal Data    Feeding Feeding Type: Formula Length of feed: 30 min  LATCH Score/Interventions                      Lactation Tools Discussed/Used WIC Program: Yes (appointmetn for Monday, to get DEp) Pump Review: Setup, frequency, and cleaning;Milk Storage;Other (comment) (use of loaner DEP and transport of milk to the nICU)   Consult Status Consult Status: PRN Follow-up type: In-patient (NICU)    Alfred LevinsLee, Dekota Shenk Anne 04/26/2016, 1:56 PM

## 2016-04-27 LAB — TYPE AND SCREEN
ABO/RH(D): O POS
ANTIBODY SCREEN: NEGATIVE
UNIT DIVISION: 0
Unit division: 0
Unit division: 0
Unit division: 0

## 2016-04-29 ENCOUNTER — Inpatient Hospital Stay (HOSPITAL_COMMUNITY)
Admission: AD | Admit: 2016-04-29 | Discharge: 2016-04-30 | Discharge status: Against Medical Advice | Payer: Self-pay | Source: Ambulatory Visit | Attending: Obstetrics & Gynecology | Admitting: Obstetrics & Gynecology

## 2016-04-29 ENCOUNTER — Encounter (HOSPITAL_COMMUNITY): Payer: Self-pay

## 2016-04-29 DIAGNOSIS — Z5321 Procedure and treatment not carried out due to patient leaving prior to being seen by health care provider: Secondary | ICD-10-CM | POA: Insufficient documentation

## 2016-04-29 NOTE — MAU Note (Signed)
Patient presents post c/s on 04/23/16 with c/o her wound vac turning off and she was told if this happens to come in and have it checked.

## 2016-04-30 ENCOUNTER — Other Ambulatory Visit (HOSPITAL_COMMUNITY): Payer: Self-pay

## 2016-04-30 ENCOUNTER — Ambulatory Visit: Payer: Self-pay | Admitting: *Deleted

## 2016-04-30 VITALS — BP 179/90

## 2016-04-30 DIAGNOSIS — O1002 Pre-existing essential hypertension complicating childbirth: Secondary | ICD-10-CM

## 2016-04-30 MED ORDER — AMLODIPINE BESYLATE 10 MG PO TABS: 10.0000 mg | ORAL_TABLET | Freq: Every day | ORAL | Status: DC

## 2016-04-30 NOTE — Progress Notes (Signed)
Pt complaining of headaches and feet swelling. Pt is 1 week post csection for preeclampsia. Spoke with Dr. Jolayne Pantheronstant regarding patients bp and incision. She recommended that patient start Norvasc 10 mg daily and return for BP Check Friday Morning at 11 am. [place] Cross[place] discontinued, incision noted to be clean and

## 2016-05-03 ENCOUNTER — Ambulatory Visit: Payer: Self-pay

## 2016-05-03 ENCOUNTER — Encounter (HOSPITAL_COMMUNITY)
Admission: RE | Admit: 2016-05-03 | Discharge: 2016-05-03 | Disposition: A | Discharge status: Home / Self Care | Payer: Self-pay | Source: Ambulatory Visit

## 2016-05-03 HISTORY — DX: Adverse effect of unspecified anesthetic, initial encounter: T41.45XA

## 2016-05-03 HISTORY — DX: Other complications of anesthesia, initial encounter: T88.59XA

## 2016-05-04 ENCOUNTER — Encounter (HOSPITAL_COMMUNITY): Admission: RE | Payer: Self-pay | Source: Ambulatory Visit

## 2016-05-04 ENCOUNTER — Inpatient Hospital Stay (HOSPITAL_COMMUNITY): Admission: RE | Admit: 2016-05-04 | Payer: Self-pay | Source: Ambulatory Visit | Admitting: Family Medicine

## 2016-05-04 SURGERY — Surgical Case
Anesthesia: Regional | Site: Abdomen

## 2016-05-29 ENCOUNTER — Ambulatory Visit: Payer: Self-pay | Admitting: Advanced Practice Midwife

## 2016-05-29 ENCOUNTER — Encounter: Payer: Self-pay | Admitting: *Deleted

## 2016-05-29 NOTE — Progress Notes (Signed)
Maria Daniel missed a scheduled appointment for postpartum visit. Per discussion do not need to call patient, if she calls can be rescheduled if patient desires.

## 2016-08-14 ENCOUNTER — Ambulatory Visit (INDEPENDENT_AMBULATORY_CARE_PROVIDER_SITE_OTHER): Payer: Self-pay | Admitting: Certified Nurse Midwife

## 2016-08-14 ENCOUNTER — Encounter: Payer: Self-pay | Admitting: Certified Nurse Midwife

## 2016-08-14 DIAGNOSIS — I10 Essential (primary) hypertension: Secondary | ICD-10-CM

## 2016-08-14 DIAGNOSIS — O24439 Gestational diabetes mellitus in the puerperium, unspecified control: Secondary | ICD-10-CM

## 2016-08-14 DIAGNOSIS — M25569 Pain in unspecified knee: Secondary | ICD-10-CM

## 2016-08-14 MED ORDER — IBUPROFEN 800 MG PO TABS: 800.0000 mg | ORAL_TABLET | Freq: Three times a day (TID) | ORAL | 0 refills | Status: DC | PRN

## 2016-08-14 MED ORDER — AMLODIPINE BESYLATE 10 MG PO TABS: 10.0000 mg | ORAL_TABLET | Freq: Every day | ORAL | 1 refills | Status: DC

## 2016-08-14 NOTE — Patient Instructions (Signed)
[facility] Address: [address], [place], [place] [address]  Hours:  Open today  8:30AM-12:30PM, 1:30-5PM                       Phone: [phone]

## 2016-08-14 NOTE — Addendum Note (Signed)
Addended by: Sherre LainASH, AMANDA A on: 08/14/2016 11:48 AM   Modules accepted: Orders

## 2016-08-14 NOTE — Progress Notes (Signed)
Subjective:   Video interpreter used for entire visit   Maria Daniel is a 35 y.o. female who presents for a postpartum visit. She is 16 weeks postpartum following a low cervical transverse Cesarean section. I have fully reviewed the prenatal and intrapartum course. The delivery was at 37 gestational weeks. Outcome: repeat cesarean section, low transverse incision. Anesthesia: spinal. Postpartum course has been uncomlicated. Baby's course has been uncomplicated. Baby is feeding by breast. Bleeding no bleeding. Bowel function is normal. Bladder function is normal. Patient is not sexually active. Contraception method is withdrawal. Postpartum depression screening: negative.  C/o knee pain x1 month, worse with bending, and feels strength is reduced, worse at night. She was using Percocet for pain but ran out of Rx yesterday. She reports these sx occurred after about 6 months of breastfeeding her first baby as well then the sx subsided during the most recent pregnancy.    The following portions of the patient's history were reviewed and updated as appropriate: allergies, current medications, past family history, past medical history, past social history, past surgical history and problem list.  Review of Systems Pertinent items are noted in HPI.   Objective:    BP 137/61   Pulse 90   Wt 233 lb 6.4 oz (105.9 kg)   BMI 41.34 kg/m   General:  alert, cooperative and no distress     Lungs: normal resp effort  Heart:  normal HR  Abdomen: normal findings: soft, non-tender and low transverse abdominal incision, well healed, no edema or erythema      Skin: warm, dry  Assessment:     Normal postpartum exam. Pap smear not done at today's visit.  1. Postpartum examination following cesarean delivery   2. Gestational diabetes mellitus (GDM), postpartum   3. Chronic hypertension   4. Joint pain, knee, unspecified laterality    -HTN likely chronic, has had documented elevated BPs outside of  pregnancy -joint pain possibly linked to Calcium demands of breastfeeding -considering IUD, Lyletta info provided, recommend consistent use of condoms for now  Plan:  2 hr GTT today Continue Norvasc 10 mg daily Rx refill today Ibuprofen 800 mg po q8 hrs prn #30, no refill Calcium 1500 mg po daily OTC Follow up with PCP of choice for mngt of HTN Will notify pt of GTT results and mngt as indicated

## 2016-08-15 LAB — GLUCOSE TOLERANCE, 2 HOURS
Glucose, 2 hour: 86 mg/dL (ref <140)
Glucose, Fasting: 96 mg/dL (ref 65–99)

## 2016-08-21 ENCOUNTER — Telehealth: Payer: Self-pay | Admitting: *Deleted

## 2016-08-21 NOTE — Telephone Encounter (Signed)
Pt left message yesterday on nurse voice mail which was in Spanish. She will need to be called back with interpreter.

## 2016-08-26 NOTE — Telephone Encounter (Signed)
Attempted to reach patient but she was not available at this time.

## 2016-08-26 NOTE — Telephone Encounter (Signed)
A message has been left by patient husband to call us back

## 2016-09-08 ENCOUNTER — Encounter (HOSPITAL_COMMUNITY): Payer: Self-pay

## 2016-09-08 ENCOUNTER — Other Ambulatory Visit: Payer: Self-pay

## 2016-09-08 ENCOUNTER — Emergency Department (HOSPITAL_COMMUNITY): Payer: Self-pay

## 2016-09-08 ENCOUNTER — Emergency Department (HOSPITAL_COMMUNITY)
Admission: EM | Admit: 2016-09-08 | Discharge: 2016-09-09 | Disposition: A | Discharge status: Home / Self Care | Payer: Self-pay | Attending: Emergency Medicine | Admitting: Emergency Medicine

## 2016-09-08 DIAGNOSIS — R222 Localized swelling, mass and lump, trunk: Secondary | ICD-10-CM

## 2016-09-08 DIAGNOSIS — E119 Type 2 diabetes mellitus without complications: Secondary | ICD-10-CM | POA: Insufficient documentation

## 2016-09-08 DIAGNOSIS — I880 Nonspecific mesenteric lymphadenitis: Secondary | ICD-10-CM | POA: Insufficient documentation

## 2016-09-08 DIAGNOSIS — R000 Tachycardia, unspecified: Secondary | ICD-10-CM | POA: Insufficient documentation

## 2016-09-08 LAB — CBC
HCT: 40.1 % (ref 36.0–46.0)
HEMOGLOBIN: 13.2 g/dL (ref 12.0–15.0)
MCH: 24.9 pg — AB (ref 26.0–34.0)
MCHC: 32.9 g/dL (ref 30.0–36.0)
MCV: 75.5 fL — AB (ref 78.0–100.0)
Platelets: 394 10*3/uL (ref 150–400)
RBC: 5.31 MIL/uL — AB (ref 3.87–5.11)
RDW: 12.5 % (ref 11.5–15.5)
WBC: 9.7 10*3/uL (ref 4.0–10.5)

## 2016-09-08 LAB — URINALYSIS, ROUTINE W REFLEX MICROSCOPIC
Bilirubin Urine: NEGATIVE
Glucose, UA: NEGATIVE mg/dL
HGB URINE DIPSTICK: NEGATIVE
Ketones, ur: NEGATIVE mg/dL
LEUKOCYTES UA: NEGATIVE
Nitrite: NEGATIVE
Protein, ur: NEGATIVE mg/dL
SPECIFIC GRAVITY, URINE: 1.024 (ref 1.005–1.030)
pH: 5 (ref 5.0–8.0)

## 2016-09-08 LAB — COMPREHENSIVE METABOLIC PANEL
ALT: 71 U/L — ABNORMAL HIGH (ref 14–54)
ANION GAP: 10 (ref 5–15)
AST: 49 U/L — ABNORMAL HIGH (ref 15–41)
Albumin: 3.7 g/dL (ref 3.5–5.0)
Alkaline Phosphatase: 89 U/L (ref 38–126)
BUN: 18 mg/dL (ref 6–20)
CALCIUM: 10.3 mg/dL (ref 8.9–10.3)
CHLORIDE: 106 mmol/L (ref 101–111)
CO2: 23 mmol/L (ref 22–32)
Creatinine, Ser: 0.61 mg/dL (ref 0.44–1.00)
GFR calc non Af Amer: >60 mL/min (ref >60)
Glucose, Bld: 133 mg/dL — ABNORMAL HIGH (ref 65–99)
Potassium: 4.4 mmol/L (ref 3.5–5.1)
SODIUM: 139 mmol/L (ref 135–145)
Total Bilirubin: 0.6 mg/dL (ref 0.3–1.2)
Total Protein: 8.2 g/dL — ABNORMAL HIGH (ref 6.5–8.1)

## 2016-09-08 LAB — I-STAT BETA HCG BLOOD, ED (MC, WL, AP ONLY)

## 2016-09-08 LAB — LIPASE, BLOOD: LIPASE: 28 U/L (ref 11–51)

## 2016-09-08 MED ORDER — HYDROMORPHONE HCL 1 MG/ML IJ SOLN
1.0000 mg | Freq: Once | INTRAMUSCULAR | Status: DC
Start: 2016-09-08 — End: 2016-09-09
  Filled 2016-09-08: qty 1

## 2016-09-08 MED ORDER — NAPROXEN 250 MG PO TABS
500.0000 mg | ORAL_TABLET | Freq: Once | ORAL | Status: AC
  Administered 2016-09-09: 500 mg via ORAL
  Filled 2016-09-08: qty 2

## 2016-09-08 MED ORDER — FENTANYL CITRATE (PF) 100 MCG/2ML IJ SOLN
100.0000 ug | Freq: Once | INTRAMUSCULAR | Status: AC
  Administered 2016-09-08: 100 ug via INTRAVENOUS
  Filled 2016-09-08: qty 2

## 2016-09-08 MED ORDER — IOPAMIDOL (ISOVUE-370) INJECTION 76%
INTRAVENOUS | Status: AC
  Administered 2016-09-09: 100 mL
  Filled 2016-09-08: qty 100

## 2016-09-08 NOTE — ED Triage Notes (Signed)
Pt complaining tremors times 3 weeks. C-Section x 4 months ago with palpable tender palpable mass in lower abdomen. Denies pain while urination with blood in urine. Normal bowel movements.

## 2016-09-08 NOTE — ED Provider Notes (Signed)
[facility]-EMER[facility]ENCY DEPT Provider Note  CSN: [id number] Arrival Date & Time: 09/08/16 @ 1625  History    Chief Complaint Chief Complaint  Patient presents with  . Abdominal Pain  . Tachycardia    HPI Maria Daniel is a 35 y.o. female.  4 months previous patient had C-section and now presents with "bumps around my cut site". They now hurt and are painful. 5/10 in severity.  Had follow up for C-section on 08/14/16 and had the same "bumps" that are present now however they were not painful as they are now.  Patient endorses no sexual encounter following delivery.  Patient denies vaginal clots or vaginal discharge. Patient endorses fevers, chills, near syncope, SOB, along with emesis and nausea and feels dehydrated. Denies chest pain.  Patient endorses NO history of blood clotting in lungs or legs. Not taking estrogen.  Past Medical & Surgical History    Past Medical History:  Diagnosis Date  . Complication of anesthesia    had trouble with spinal not working with last cs does not want a learner to do spinal  . Diabetes mellitus without complication (HCC)   . Gestational diabetes   . Hypertension    no meds patient denies  . Knee pain   . Miscarriage    Patient Active Problem List   Diagnosis Date Noted  . Gestational hypertension 04/23/2016  . LGA (large for gestational age) fetus affecting management of mother 04/01/2016  . Gestational diabetes mellitus (GDM) in third trimester controlled on oral hypoglycemic drug 03/12/2016  . History of gestational diabetes in prior pregnancy, currently pregnant in third trimester 02/20/2016  . Obesity affecting pregnancy in third trimester, antepartum 02/20/2016  . Supervision of high risk pregnancy, antepartum 09/14/2015  . Borderline hypertension 04/15/2012  . History of C-section 04/14/2012   Past Surgical History:  Procedure Laterality Date  . CESAREAN SECTION    . CESAREAN SECTION  08/17/2012   Procedure: CESAREAN  SECTION;  Surgeon: Lesly Dukes, MD;  Location: [facility];  Service: Obstetrics;  Laterality: N/A;  . CESAREAN SECTION N/A 04/23/2016   Procedure: CESAREAN SECTION;  Surgeon: Catalina Antigua, MD;  Location: [facility];  Service: Obstetrics;  Laterality: N/A;    Family & Social History    Family History  Problem Relation Age of Onset  . Hypertension Mother   . Kidney disease Father   . Multiple sclerosis Father    Social History  Substance Use Topics  . Smoking status: Never Smoker  . Smokeless tobacco: Never Used  . Alcohol use No     Comment: socially    Home Medications    Prior to Admission medications   Medication Sig Start Date End Date Taking? Authorizing Provider  amLODipine (NORVASC) 10 MG tablet Take 1 tablet (10 mg total) by mouth daily. 04/30/16   Peggy Constant, MD  amLODipine (NORVASC) 10 MG tablet Take 1 tablet (10 mg total) by mouth daily. 08/14/16   Donette Larry, CNM  ibuprofen (ADVIL,MOTRIN) 600 MG tablet Take 1 tablet (600 mg total) by mouth every 6 (six) hours as needed. 04/26/16   Arabella Merles, CNM  ibuprofen (ADVIL,MOTRIN) 800 MG tablet Take 1 tablet (800 mg total) by mouth every 8 (eight) hours as needed. 08/14/16   Donette Larry, CNM  ondansetron (ZOFRAN ODT) 4 MG disintegrating tablet Take 1 tablet (4 mg total) by mouth every 8 (eight) hours as needed for nausea or vomiting. 09/09/16   Melene Plan, DO  oxyCODONE (OXY IR/ROXICODONE) 5 MG immediate release  tablet Take 1 tablet (5 mg total) by mouth every 4 (four) hours as needed (pain scale 4-7). Patient not taking: Reported on 08/14/2016 04/26/16   Arabella Merles, CNM  polyethylene glycol powder [facility]) powder Follow directions on package; take twice daily as needed for constipation Patient not taking: Reported on 08/14/2016 04/26/16   Arabella Merles, CNM  Prenatal Vit-Fe Fumarate-FA (PRENATAL MULTIVITAMIN) TABS tablet Take 1 tablet by mouth every evening.    Historical Provider, MD    senna-docusate (SENOKOT-S) 8.6-50 MG tablet Take 2 tablets by mouth daily. Patient not taking: Reported on 08/14/2016 04/26/16   Arabella Merles, CNM    Allergies    Review of patient's allergies indicates no known allergies.  I reviewed & agree with nursing's documentation on the patient's past medical, surgical, social & family histories as well as their allergies.  Review of Systems  Complete ROS obtained, and is negative except as stated in HPI.  Physical Exam  Updated Vital Signs BP 123/60   Pulse 105   Temp 98.8 F (37.1 C) (Oral)   Resp (!) 27   Wt 104.3 kg   SpO2 99%   Breastfeeding? Yes   BMI 40.74 kg/m  I have reviewed the triage vital signs and the nursing notes. Physical Exam CONST: Patient alert, well appearing, in no apparent distress, oriented to person, place and time, overweight, well hydrated.  EYES: PERRLA. EOMI. Conjunctiva w/o d/c. Lids AT w/o swelling.  ENMT: External Nares & Ears AT w/o swelling. Oropharynx patent. MM moist.  NECK: ROM full w/o rigidity. Trachea midline. JVD absent.  CVS: +S1/S2 w/o obvious murmur. Lower extremities w/o pitting edema.  RESP: Respiratory effort unlabored w/o retractions & accessory muscle use. BS clear bilaterally.  GI: Soft & ND. +BS x 4. TTP absent. Hernia absent. Guarding & Rebound absent. C-section transverse abdominal incision well healed w/o evidence of erythema, warmth or induration. Patient has several underlying tender masses palpable underlying the incision.  BACK: CVA TTP absent bilaterally.  SKIN: Skin warm & dry. Turgor good. No rash.  PSYCH: Alert. Oriented. Affect and mood appropriate.  NEURO: CN II-XII grossly intact. Motor exam symmetric w/ upper & lower extremities 5/5 bilaterally. Sensation grossly intact.  MSK: Joints located & stable, w/o obvious dislocation & obvious deformity or crepitus absent w/ Cap refill < 2 sec. Peripheral pulses 2+ & equal in all extremities.   ED Treatments & Results    Labs (only abnormal results are displayed) Labs Reviewed  COMPREHENSIVE METABOLIC PANEL - Abnormal; Notable for the following:       Result Value   Glucose, Bld 133 (*)    Total Protein 8.2 (*)    AST 49 (*)    ALT 71 (*)    All other components within normal limits  CBC - Abnormal; Notable for the following:    RBC 5.31 (*)    MCV 75.5 (*)    MCH 24.9 (*)    All other components within normal limits  URINALYSIS, ROUTINE W REFLEX MICROSCOPIC (NOT AT [facility]) - Abnormal; Notable for the following:    Color, Urine AMBER (*)    APPearance CLOUDY (*)    All other components within normal limits  D-DIMER, QUANTITATIVE (NOT AT [facility]) - Abnormal; Notable for the following:    D-Dimer, Quant 0.60 (*)    All other components within normal limits  LIPASE, BLOOD  I-STAT BETA HCG BLOOD, ED ([facility], [facility], [facility] ONLY)    EKG    EKG Interpretation  Date/Time:  Sunday September 08 2016 21:05:27 EDT Ventricular Rate:  111 PR Interval:  132 QRS Duration: 87 QT Interval:  346 QTC Calculation: 471 R Axis:   11 Text Interpretation:  Sinus tachycardia Low voltage, precordial leads Abnormal R-wave progression, early transition No significant change since last tracing Confirmed by FLOYD MD, Reuel BoomANIEL [id number]) on 09/09/2016 12:50:45 AM       Radiology Ct Angio Chest Pe W And/or Wo Contrast  Result Date: 09/09/2016 CLINICAL DATA:  Chest pain and pressure with dyspnea, abdominal pain EXAM: CT ANGIOGRAPHY CHEST CT ABDOMEN AND PELVIS WITH CONTRAST TECHNIQUE: Multidetector CT imaging of the chest was performed using the standard protocol during bolus administration of intravenous contrast. Multiplanar CT image reconstructions and MIPs were obtained to evaluate the vascular anatomy. Multidetector CT imaging of the abdomen and pelvis was performed using the standard protocol during bolus administration of intravenous contrast. CONTRAST:  100 mL Isovue 370 IV COMPARISON:  11/14/2014 chest radiograph FINDINGS: CTA  CHEST FINDINGS Cardiovascular: Preferential opacification of the thoracic aorta. No evidence of thoracic aortic aneurysm or dissection. Normal heart size. No pericardial effusion. Mild cardiomegaly. No large central pulmonary embolus. Images slightly degraded by motion artifact. Mediastinum/Nodes: No enlarged mediastinal, hilar, or axillary lymph nodes. 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 No discrete mass of the thyroid identified. The trachea and esophagus demonstrate no significant findings. Lungs/Pleura: Calcified granuloma in the right middle lobe series 507, image 67. Hazy ground-glass opacities in both lungs which may simply reflect hypoventilatory change. No pneumonic consolidation, effusion or pneumothorax. Musculoskeletal: No chest wall abnormality. No acute or significant osseous findings. Review of the MIP images confirms the above findings. CT ABDOMEN and PELVIS FINDINGS Hepatobiliary: No focal liver abnormality is seen. No gallstones, gallbladder wall thickening, or biliary dilatation. Pancreas: Unremarkable. No pancreatic ductal dilatation or surrounding inflammatory changes. Spleen: Normal in size without focal abnormality. Adrenals/Urinary Tract: Adrenal glands are unremarkable. Kidneys are normal, without renal calculi, focal lesion, or hydronephrosis. Bladder is unremarkable. Stomach/Bowel: Stomach is within normal limits. Appendix appears normal. No evidence of bowel wall thickening, distention, or inflammatory changes. Vascular/Lymphatic: A few small mesenteric lymph nodes noted in the right lower quadrant. No aortic aneurysm or dissection. Reproductive: Uterus and bilateral adnexa are unremarkable. Other: No abdominal wall hernia. Small subcutaneous nodules in the left lower quadrant fat may represent small lymph nodes and/or changes secondary to iatrogenic injection of medication. No free air. No abdominopelvic ascites. Musculoskeletal: No acute or significant osseous  findings. Review of the MIP images confirms the above findings. IMPRESSION: No acute cardiopulmonary disease. No thoracic aortic dissection or aneurysm. No large central pulmonary embolus. Small right lower quadrant mesenteric lymph nodes possibly related to mesenteric adenitis. Otherwise negative for acute abnormalities. Electronically Signed   By: Tollie Ethavid  Kwon M.D.   On: 09/09/2016 01:48   Ct Abdomen Pelvis W Contrast  Result Date: 09/09/2016 CLINICAL DATA:  Chest pain and pressure with dyspnea, abdominal pain EXAM: CT ANGIOGRAPHY CHEST CT ABDOMEN AND PELVIS WITH CONTRAST TECHNIQUE: Multidetector CT imaging of the chest was performed using the standard protocol during bolus administration of intravenous contrast. Multiplanar CT image reconstructions and MIPs were obtained to evaluate the vascular anatomy. Multidetector CT imaging of the abdomen and pelvis was performed using the standard protocol during bolus administration of intravenous contrast. CONTRAST:  100 mL Isovue 370 IV COMPARISON:  11/14/2014 chest radiograph FINDINGS: CTA CHEST FINDINGS Cardiovascular: Preferential opacification of the thoracic aorta. No evidence of thoracic aortic aneurysm or dissection. Normal heart size.  No pericardial effusion. Mild cardiomegaly. No large central pulmonary embolus. Images slightly degraded by motion artifact. Mediastinum/Nodes: No enlarged mediastinal, hilar, or axillary lymph nodes. 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 No discrete mass of the thyroid identified. The trachea and esophagus demonstrate no significant findings. Lungs/Pleura: Calcified granuloma in the right middle lobe series 507, image 67. Hazy ground-glass opacities in both lungs which may simply reflect hypoventilatory change. No pneumonic consolidation, effusion or pneumothorax. Musculoskeletal: No chest wall abnormality. No acute or significant osseous findings. Review of the MIP images confirms the above findings. CT  ABDOMEN and PELVIS FINDINGS Hepatobiliary: No focal liver abnormality is seen. No gallstones, gallbladder wall thickening, or biliary dilatation. Pancreas: Unremarkable. No pancreatic ductal dilatation or surrounding inflammatory changes. Spleen: Normal in size without focal abnormality. Adrenals/Urinary Tract: Adrenal glands are unremarkable. Kidneys are normal, without renal calculi, focal lesion, or hydronephrosis. Bladder is unremarkable. Stomach/Bowel: Stomach is within normal limits. Appendix appears normal. No evidence of bowel wall thickening, distention, or inflammatory changes. Vascular/Lymphatic: A few small mesenteric lymph nodes noted in the right lower quadrant. No aortic aneurysm or dissection. Reproductive: Uterus and bilateral adnexa are unremarkable. Other: No abdominal wall hernia. Small subcutaneous nodules in the left lower quadrant fat may represent small lymph nodes and/or changes secondary to iatrogenic injection of medication. No free air. No abdominopelvic ascites. Musculoskeletal: No acute or significant osseous findings. Review of the MIP images confirms the above findings. IMPRESSION: No acute cardiopulmonary disease. No thoracic aortic dissection or aneurysm. No large central pulmonary embolus. Small right lower quadrant mesenteric lymph nodes possibly related to mesenteric adenitis. Otherwise negative for acute abnormalities. Electronically Signed   By: Tollie Eth M.D.   On: 09/09/2016 01:48    Pertinent labs & imaging results that were available during my care of the patient were independently visualized by me and considered in my medical decision making, please see chart for details. Formal interpretation provided by Radiology.  Procedures (including critical care time) Procedures  Medications Ordered in ED Medications  fentaNYL (SUBLIMAZE) injection 100 mcg (100 mcg Intravenous Given 09/08/16 2236)  iopamidol (ISOVUE-370) 76 % injection (100 mLs  Contrast Given 09/09/16  0031)  naproxen (NAPROSYN) tablet 500 mg (500 mg Oral Given 09/09/16 0013)  fentaNYL (SUBLIMAZE) injection 100 mcg (100 mcg Intravenous Given 09/09/16 0013)  HYDROcodone-acetaminophen (NORCO/VICODIN) 5-325 MG per tablet 2 tablet (2 tablets Oral Given 09/09/16 0206)  sodium chloride 0.9 % bolus 1,000 mL (0 mLs Intravenous Stopped 09/09/16 0411)    Initial Impression & Plan / ED Course & Results / Final Disposition   Initial Impression & Plan Patient presents emergency department for assessment of painful masses per previous incisional site of C-section.  Per physical exam and vital signs do not have concern for infection and I have considered for cellulitis, abscess, necrotizing soft tissue infection and do not believe patient is at risk for these.  ED Course & Results Due to symptomatic endorsements I obtained screening laboratory work which upon my review I appreciate patient is not pregnant CMP and CBC unremarkable and patient has no leukocytosis lipase within normal limits urinalysis not consistent with infection of urinary tract.  Due to patient's endorsement of chest pain ACS and pulmonary embolism and differential however patient has not had previous blood clot and takes no estrogen and for absence of other risk factors except for recent pregnancy believe patient be low risk therefore will obtain d-dimer.  EKG reveals no evidence of acute STEMI.  D-dimer moderately elevated therefore decision  made to obtain CT angiography chest to evaluate for PE along with CT abdomen pelvis with contrast to evaluate for evidence of soft tissue infection versus developing abscess of abdomen.  Final Disposition Reassessment of the patient reveals no acute concerns. Symptoms more controlled with IV pain medications. Per my clinical impression at the end of my shift, patient still requires CT imaging of chest abdomen and pelvis, therefore care assumed by Dr. Adela Lank. Please refer to their documentation regarding  continued ED course along with final impression and disposition.  Final Clinical Impression & ED Diagnoses   1. Mesenteric adenitis   2. Abdominal wall mass   3. Tachycardia    Patient care discussed with the attending physician, Dr. Erma Heritage, who oversaw their evaluation & treatment & voiced agreement.  Note: This document was prepared using Dragon voice recognition software and may include unintentional dictation errors.  House Officer: Jonette Eva, MD, Emergency Medicine Resident.   Jonette Eva, MD 09/10/16 1713    Shaune Pollack, MD 09/11/16 1344

## 2016-09-09 ENCOUNTER — Encounter (HOSPITAL_COMMUNITY): Payer: Self-pay | Admitting: Radiology

## 2016-09-09 ENCOUNTER — Emergency Department (HOSPITAL_COMMUNITY): Payer: Self-pay

## 2016-09-09 LAB — D-DIMER, QUANTITATIVE: D-Dimer, Quant: 0.6 ug/mL-FEU — ABNORMAL HIGH (ref 0.00–0.50)

## 2016-09-09 MED ORDER — SODIUM CHLORIDE 0.9 % IV BOLUS (SEPSIS)
1000.0000 mL | Freq: Once | INTRAVENOUS | Status: AC
  Administered 2016-09-09: 1000 mL via INTRAVENOUS

## 2016-09-09 MED ORDER — KETOROLAC TROMETHAMINE 15 MG/ML IJ SOLN: 15.0000 mg | Freq: Once | INTRAMUSCULAR | Status: DC

## 2016-09-09 MED ORDER — FENTANYL CITRATE (PF) 100 MCG/2ML IJ SOLN
100.0000 ug | Freq: Once | INTRAMUSCULAR | Status: AC
  Administered 2016-09-09: 100 ug via INTRAVENOUS
  Filled 2016-09-09: qty 2

## 2016-09-09 MED ORDER — ONDANSETRON 4 MG PO TBDP
4.0000 mg | ORAL_TABLET | Freq: Three times a day (TID) | ORAL | 0 refills | Status: DC | PRN
Start: 2016-09-09 — End: 2016-11-05

## 2016-09-09 MED ORDER — HYDROCODONE-ACETAMINOPHEN 5-325 MG PO TABS
2.0000 | ORAL_TABLET | Freq: Once | ORAL | Status: AC
  Administered 2016-09-09: 2 via ORAL
  Filled 2016-09-09: qty 2

## 2016-09-09 NOTE — Discharge Instructions (Signed)
You have mesenteric adenitis.  This is usually due to a viral infection.

## 2016-09-10 ENCOUNTER — Encounter (HOSPITAL_COMMUNITY): Payer: Self-pay | Admitting: *Deleted

## 2016-09-10 ENCOUNTER — Inpatient Hospital Stay (HOSPITAL_COMMUNITY)
Admission: AD | Admit: 2016-09-10 | Discharge: 2016-09-10 | Disposition: A | Discharge status: Home / Self Care | Payer: Self-pay | Source: Ambulatory Visit | Attending: Obstetrics & Gynecology | Admitting: Obstetrics & Gynecology

## 2016-09-10 DIAGNOSIS — R109 Unspecified abdominal pain: Secondary | ICD-10-CM | POA: Insufficient documentation

## 2016-09-10 DIAGNOSIS — R1030 Lower abdominal pain, unspecified: Secondary | ICD-10-CM

## 2016-09-10 LAB — URINALYSIS, ROUTINE W REFLEX MICROSCOPIC
GLUCOSE, UA: NEGATIVE mg/dL
HGB URINE DIPSTICK: NEGATIVE
KETONES UR: 15 mg/dL — AB
LEUKOCYTES UA: NEGATIVE
Nitrite: NEGATIVE
PH: 5 (ref 5.0–8.0)
PROTEIN: NEGATIVE mg/dL
Specific Gravity, Urine: 1.025 (ref 1.005–1.030)

## 2016-09-10 MED ORDER — HYDROMORPHONE HCL 2 MG PO TABS
2.0000 mg | ORAL_TABLET | Freq: Once | ORAL | Status: AC
  Administered 2016-09-10: 2 mg via ORAL
  Filled 2016-09-10: qty 1

## 2016-09-10 MED ORDER — LIDOCAINE 5 % EX OINT
TOPICAL_OINTMENT | Freq: Once | CUTANEOUS | Status: AC
  Administered 2016-09-10: 19:00:00 via TOPICAL
  Filled 2016-09-10: qty 35.44

## 2016-09-10 MED ORDER — OXYCODONE HCL 5 MG PO TABS: 5.0000 mg | ORAL_TABLET | ORAL | 0 refills | Status: DC | PRN

## 2016-09-10 NOTE — Discharge Instructions (Signed)
Dolor abdominal en adultos °(Abdominal Pain, Adult) °El dolor puede tener muchas causas. Normalmente la causa del dolor abdominal no es una enfermedad y mejorará sin tratamiento. Frecuentemente puede controlarse y tratarse en casa. Su médico le realizará un examen físico y posiblemente solicite análisis de sangre y radiografías para ayudar a determinar la gravedad de su dolor. Sin embargo, en muchos casos, debe transcurrir más tiempo antes de que se pueda encontrar una causa evidente del dolor. Antes de llegar a ese punto, es posible que su médico no sepa si necesita más pruebas o un tratamiento más profundo. °INSTRUCCIONES PARA EL CUIDADO EN EL HOGAR  °Esté atento al dolor para ver si hay cambios. Las siguientes indicaciones ayudarán a aliviar cualquier molestia que pueda sentir: °· Tome solo medicamentos de venta libre o recetados, según las indicaciones del médico. °· No tome laxantes a menos que se lo haya indicado su médico. °· Pruebe con una dieta líquida absoluta (caldo, té o agua) según se lo indique su médico. Introduzca gradualmente una dieta normal, según su tolerancia. °SOLICITE ATENCIÓN MÉDICA SI: °· Tiene dolor abdominal sin explicación. °· Tiene dolor abdominal relacionado con náuseas o diarrea. °· Tiene dolor cuando orina o defeca. °· Experimenta dolor abdominal que lo despierta de noche. °· Tiene dolor abdominal que empeora o mejora cuando come alimentos. °· Tiene dolor abdominal que empeora cuando come alimentos grasosos. °· Tiene fiebre. °SOLICITE ATENCIÓN MÉDICA DE INMEDIATO SI:  °· El dolor no desaparece en un plazo máximo de 2 horas. °· No deja de (vomitar). °· El dolor se siente solo en partes del abdomen, como el lado derecho o la parte inferior izquierda del abdomen. °· Evacúa materia fecal sanguinolenta o negra, de aspecto alquitranado. °ASEGÚRESE DE QUE: °· Comprende estas instrucciones. °· Controlará su afección. °· Recibirá ayuda de inmediato si no mejora o si empeora. °  °Esta  información no tiene como fin reemplazar el consejo del médico. Asegúrese de hacerle al médico cualquier pregunta que tenga. °  °Document Released: 11/11/2005 Document Revised: 12/02/2014 °Elsevier Interactive Patient Education ©2016 [employer] ° °

## 2016-09-10 NOTE — MAU Note (Signed)
C/s on May 30. Pain in lower abd. Is tender to touch.  [facility] Denies fever. Has been vomiting, dizzy.  Sometimes has blood in urine.

## 2016-09-10 NOTE — MAU Provider Note (Signed)
History     CSN: [id number]  Arrival date and time: 09/10/16 1744   First Provider Initiated Contact with Patient 09/10/16 1837      Chief Complaint  Patient presents with  . Abdominal Pain   HPI RN note: MAU Note Date of Service: 09/10/2016 5:58 PM Jolynn K Spurlock-Frizzell, RN    [] Hide copied text [] Hover for attribution information C/s on May 30. Pain in lower abd. Is tender to touch.  Went to [facility] on Sunday. Denies fever. Has been vomiting, dizzy.  Sometimes has blood in urine    Pt c/o lower abd superficial pain above c/s  Pt has appointment in the clinic in Nov, but pt could not wait. Pt states pain started about 1 month after her c/S and has progressively gotten worse. Pt takes ibuprofen with some relief and also oxycodone Pt was seen at [facility] on Sunday and had CT of abdomen- no GYN issues noted   Past Medical History:  Diagnosis Date  . Complication of anesthesia    had trouble with spinal not working with last cs does not want a learner to do spinal  . Diabetes mellitus without complication (HCC)   . Gestational diabetes   . Hypertension    no meds patient denies  . Knee pain   . Miscarriage     Past Surgical History:  Procedure Laterality Date  . CESAREAN SECTION    . CESAREAN SECTION  08/17/2012   Procedure: CESAREAN SECTION;  Surgeon: Lesly Dukes, MD;  Location: [facility];  Service: Obstetrics;  Laterality: N/A;  . CESAREAN SECTION N/A 04/23/2016   Procedure: CESAREAN SECTION;  Surgeon: Catalina Antigua, MD;  Location: [facility];  Service: Obstetrics;  Laterality: N/A;    Family History  Problem Relation Age of Onset  . Hypertension Mother   . Kidney disease Father   . Multiple sclerosis Father     Social History  Substance Use Topics  . Smoking status: Never Smoker  . Smokeless tobacco: Never Used  . Alcohol use No     Comment: socially    Allergies: No Known Allergies  Prescriptions Prior to Admission  Medication Sig Dispense  Refill Last Dose  . amLODipine (NORVASC) 10 MG tablet Take 1 tablet (10 mg total) by mouth daily. 30 tablet 1 Taking  . amLODipine (NORVASC) 10 MG tablet Take 1 tablet (10 mg total) by mouth daily. 30 tablet 1   . ibuprofen (ADVIL,MOTRIN) 600 MG tablet Take 1 tablet (600 mg total) by mouth every 6 (six) hours as needed. 30 tablet 0 Taking  . ibuprofen (ADVIL,MOTRIN) 800 MG tablet Take 1 tablet (800 mg total) by mouth every 8 (eight) hours as needed. 30 tablet 0   . ondansetron (ZOFRAN ODT) 4 MG disintegrating tablet Take 1 tablet (4 mg total) by mouth every 8 (eight) hours as needed for nausea or vomiting. 20 tablet 0   . oxyCODONE (OXY IR/ROXICODONE) 5 MG immediate release tablet Take 1 tablet (5 mg total) by mouth every 4 (four) hours as needed (pain scale 4-7). (Patient not taking: Reported on 08/14/2016) 50 tablet 0 Not Taking  . polyethylene glycol powder (GLYCOLAX/MIRALAX) powder Follow directions on package; take twice daily as needed for constipation (Patient not taking: Reported on 08/14/2016) 255 g 0 Not Taking  . Prenatal Vit-Fe Fumarate-FA (PRENATAL MULTIVITAMIN) TABS tablet Take 1 tablet by mouth every evening.   Taking  . senna-docusate (SENOKOT-S) 8.6-50 MG tablet Take 2 tablets by mouth daily. (Patient not taking: Reported  on 08/14/2016)   Not Taking    Review of Systems  Constitutional: Negative for fever.  Gastrointestinal: Positive for abdominal pain.  Genitourinary: Negative for dysuria.  Neurological: Positive for dizziness.   Physical Exam   Blood pressure 129/79, pulse 99, temperature 98.6 F (37 C), temperature source Oral, resp. rate 18, SpO2 98 %, currently breastfeeding.  Physical Exam  Nursing note and vitals reviewed. Constitutional: She is oriented to person, place, and time. She appears well-developed and well-nourished. No distress.  HENT:  Head: Normocephalic.  Eyes: Pupils are equal, round, and reactive to light.  Neck: Normal range of motion. Neck supple.    Cardiovascular: Normal rate.   Respiratory: Effort normal. No respiratory distress.  GI: Soft. She exhibits no distension. There is tenderness. There is guarding. There is no rebound.  Superficial tenderness above incision; incision clean and dry, well healed, no redness Abdomen soft  Musculoskeletal: Normal range of motion.  Neurological: She is alert and oriented to person, place, and time.  Skin: Skin is warm and dry.  Psychiatric: She has a normal mood and affect.    MAU Course  Procedures Dr. Macon LargeAnyanwu came to see pt and examine incision Xylocaine ointment applied to incision Diluadid 2mg  PO given Discussed with pt and husband via interpreter that incision healing well; may be nerve regeneration CT results reviewed with Dr. Macon LargeAnyanwu- recommend pain management Assessment and Plan  Abdominal pain 4 mos post C/S- no evidence of infection Rx Oxycodone IR 5mg  #30 - no refills Keep scheduled appointment in GYN clinic   Floyd Valley HospitalINEBERRY,SUSAN 09/10/2016, 6:58 PM

## 2016-09-27 ENCOUNTER — Encounter: Payer: Self-pay | Admitting: Obstetrics and Gynecology

## 2016-09-27 ENCOUNTER — Ambulatory Visit (INDEPENDENT_AMBULATORY_CARE_PROVIDER_SITE_OTHER): Payer: Self-pay | Admitting: Obstetrics and Gynecology

## 2016-09-27 VITALS — BP 125/109 | HR 102 | Wt 208.6 lb

## 2016-09-27 DIAGNOSIS — F418 Other specified anxiety disorders: Secondary | ICD-10-CM

## 2016-09-27 DIAGNOSIS — R208 Other disturbances of skin sensation: Secondary | ICD-10-CM

## 2016-09-27 DIAGNOSIS — L7682 Other postprocedural complications of skin and subcutaneous tissue: Secondary | ICD-10-CM | POA: Insufficient documentation

## 2016-09-27 DIAGNOSIS — F329 Major depressive disorder, single episode, unspecified: Secondary | ICD-10-CM

## 2016-09-27 DIAGNOSIS — F419 Anxiety disorder, unspecified: Principal | ICD-10-CM

## 2016-09-27 MED ORDER — IBUPROFEN 800 MG PO TABS: 800.0000 mg | ORAL_TABLET | Freq: Three times a day (TID) | ORAL | 1 refills | Status: DC | PRN

## 2016-09-27 NOTE — Progress Notes (Signed)
Spanish video interpreter "Elease Hashimotoatricia" [phone]#[id number] used for this visit

## 2016-09-27 NOTE — Addendum Note (Signed)
Addended by: Garret ReddishBARNES, Keisuke Hollabaugh M on: 09/27/2016 12:37 PM   Modules accepted: Orders

## 2016-09-27 NOTE — Progress Notes (Signed)
Pt seen today for c/o pain above her c section incision. She had c section 04/23/16. Had PICO placed and it was removed on 04/30/16. She had her postpartum visit on 9/20 with c/o knee pain at that time. No mention of incisional pain.  Pt states pain started 1-2 months after her c section but has intensified over the last month. Has been seen in ER with a completely negative W/U including Abd/pelvic CT scan. Pain is located about 1-2 cm above her incision. She has 2 areas that are more painful but has pain all above her incision. She has taken Motrin and OxyContin for the pain. She is requesting more OxyContin.   She denies any fever or chills. No cycles. She is breast feeding. Not sexual active.  She denies any bowel or bladder dysfunction.  PE AF VSS Lungs clear Heart RRR Abd soft, +BS c section incision is well healed, small mobile but tender nodules above incision Pelvic nl EGBUS uterus small mobile non tender, no adnexal masses or tenderness  A/P C section incision pain Possible endometriosis. Discussed Tx options of conservative vs surgery. Pt desires surgery. Surgery reviewed with pt. Motrin for pain. Pt informed unable to give OxyContin for pain. Pt will be contacted with surgery date. Interrupter services was use during days visit.

## 2016-10-04 ENCOUNTER — Telehealth: Payer: Self-pay | Admitting: Clinical

## 2016-10-04 NOTE — Telephone Encounter (Signed)
Attempt to contact pt to schedule an appointment with [facility], using telephone interpreter [place] at [phone] service. First call made to pt mobile number [phone] was answered by patient's husband, who says he is at work, and to call [phone] home phone. Left HIPPA-compliant message on [phone] to return call to [facility] at [phone].

## 2016-10-10 ENCOUNTER — Encounter (HOSPITAL_COMMUNITY): Payer: Self-pay | Admitting: *Deleted

## 2016-11-02 ENCOUNTER — Other Ambulatory Visit: Payer: Self-pay | Admitting: Certified Nurse Midwife

## 2016-11-02 ENCOUNTER — Other Ambulatory Visit: Payer: Self-pay | Admitting: Obstetrics and Gynecology

## 2016-11-02 DIAGNOSIS — O1002 Pre-existing essential hypertension complicating childbirth: Secondary | ICD-10-CM

## 2016-11-04 ENCOUNTER — Other Ambulatory Visit: Payer: Self-pay | Admitting: Obstetrics and Gynecology

## 2016-11-05 ENCOUNTER — Encounter: Payer: Self-pay | Admitting: Family Medicine

## 2016-11-05 ENCOUNTER — Other Ambulatory Visit: Payer: Self-pay

## 2016-11-05 ENCOUNTER — Encounter (HOSPITAL_COMMUNITY)
Admission: RE | Admit: 2016-11-05 | Discharge: 2016-11-05 | Disposition: A | Discharge status: Home / Self Care | Payer: Self-pay | Source: Ambulatory Visit | Attending: Obstetrics and Gynecology | Admitting: Obstetrics and Gynecology

## 2016-11-05 ENCOUNTER — Ambulatory Visit (INDEPENDENT_AMBULATORY_CARE_PROVIDER_SITE_OTHER): Payer: Self-pay | Admitting: Family

## 2016-11-05 ENCOUNTER — Encounter (HOSPITAL_COMMUNITY): Payer: Self-pay

## 2016-11-05 ENCOUNTER — Encounter: Payer: Self-pay | Admitting: Family

## 2016-11-05 DIAGNOSIS — Z01810 Encounter for preprocedural cardiovascular examination: Secondary | ICD-10-CM | POA: Insufficient documentation

## 2016-11-05 DIAGNOSIS — R208 Other disturbances of skin sensation: Secondary | ICD-10-CM

## 2016-11-05 DIAGNOSIS — R000 Tachycardia, unspecified: Secondary | ICD-10-CM | POA: Insufficient documentation

## 2016-11-05 DIAGNOSIS — L7682 Other postprocedural complications of skin and subcutaneous tissue: Secondary | ICD-10-CM

## 2016-11-05 DIAGNOSIS — Z01812 Encounter for preprocedural laboratory examination: Secondary | ICD-10-CM | POA: Insufficient documentation

## 2016-11-05 DIAGNOSIS — O1002 Pre-existing essential hypertension complicating childbirth: Secondary | ICD-10-CM

## 2016-11-05 HISTORY — DX: Insomnia, unspecified: G47.00

## 2016-11-05 HISTORY — DX: Dyspnea, unspecified: R06.00

## 2016-11-05 LAB — COMPREHENSIVE METABOLIC PANEL
ALBUMIN: 4 g/dL (ref 3.5–5.0)
ALK PHOS: 102 U/L (ref 38–126)
ALT: 51 U/L (ref 14–54)
AST: 54 U/L — ABNORMAL HIGH (ref 15–41)
Anion gap: 12 (ref 5–15)
BUN: 8 mg/dL (ref 6–20)
CALCIUM: 10.5 mg/dL — AB (ref 8.9–10.3)
CHLORIDE: 102 mmol/L (ref 101–111)
CO2: 23 mmol/L (ref 22–32)
Creatinine, Ser: 0.35 mg/dL — ABNORMAL LOW (ref 0.44–1.00)
GFR calc non Af Amer: >60 mL/min (ref >60)
GLUCOSE: 116 mg/dL — AB (ref 65–99)
POTASSIUM: 3.7 mmol/L (ref 3.5–5.1)
SODIUM: 137 mmol/L (ref 135–145)
Total Bilirubin: 1.2 mg/dL (ref 0.3–1.2)
Total Protein: 7.6 g/dL (ref 6.5–8.1)

## 2016-11-05 LAB — CBC
HEMATOCRIT: 37.7 % (ref 36.0–46.0)
HEMOGLOBIN: 12.7 g/dL (ref 12.0–15.0)
MCH: 24.7 pg — AB (ref 26.0–34.0)
MCHC: 33.7 g/dL (ref 30.0–36.0)
MCV: 73.2 fL — ABNORMAL LOW (ref 78.0–100.0)
Platelets: 354 10*3/uL (ref 150–400)
RBC: 5.15 MIL/uL — ABNORMAL HIGH (ref 3.87–5.11)
RDW: 13.9 % (ref 11.5–15.5)
WBC: 6.5 10*3/uL (ref 4.0–10.5)

## 2016-11-05 MED ORDER — IBUPROFEN 800 MG PO TABS: 800.0000 mg | ORAL_TABLET | Freq: Three times a day (TID) | ORAL | 1 refills | Status: DC | PRN

## 2016-11-05 MED ORDER — AMLODIPINE BESYLATE 10 MG PO TABS: 10.0000 mg | ORAL_TABLET | Freq: Every day | ORAL | 6 refills | Status: DC

## 2016-11-05 MED ORDER — OXYCODONE HCL 5 MG PO TABS: 5.0000 mg | ORAL_TABLET | ORAL | 0 refills | Status: DC | PRN

## 2016-11-05 NOTE — Patient Instructions (Addendum)
Your procedure is scheduled on:  Monday, Dec. 18, 2017  [facility] through [facility] at:  1:00 PM  Pick up the phone at the desk and dial [phone].  Call this number if you have problems the morning of surgery: [phone].  Remember: Do NOT eat food:  After Midnight Sunday  Do NOT drink clear liquids after:  10:30 AM day of surgery  Take these medicines the morning of surgery with a SIP OF WATER:  Amlodipine  Stop ALL herbal medications at this time   Do NOT wear jewelry (body piercing), metal hair clips/bobby pins, make-up, or nail polish. Do NOT wear lotions, powders, or perfumes.  You may wear deodorant. Do NOT shave for 48 hours prior to surgery. Do NOT bring valuables to the hospital. Contacts, dentures, or bridgework may not be worn into surgery. Leave suitcase in car.  After surgery it may be brought to your room.  For patients admitted to the hospital, checkout time is 11:00 AM the day of discharge. Have a responsible adult drive you home and stay with you for 24 hours after your procedure  Instrucciones:  Su cirugia esta programada para-( your procedure is scheduled on) : Monday, Dec. 18, 2017   Entre por la entrada principal a la(s) -(enter through the main entrance at): 1:00 PM   [place] [employer] el telefono,  [place] el [phone] e informenos de su llegada ( pick up phone, dial [phone] on arrival)  Por favor llame al [phone] si tiene algun problema la Lily Kochermanana de cirugia ( please call [phone] if you have any problems the morning of surgery.)  Recuerde: (Remember)  No coma alimentosdespues de la medianoche del Sunday   ni tome liquidos  incluyendo agua, after 10:30 AM day of surgery  ( Do not eat food or drink liquids including water after midnight on_______________  Oswaldo Doneome estas medicinas la manana de la cirugia con un sorbito de agua (take these meds the morning of surgery with a SIP of water)  Amlodipine Puede cepillarse los dientes en la manana de  la [place]. (you may brush your teeth the morning of surgery)  NO use joyas, maquillaje de ojos, lapiz labial, crema para el cuerpo o esmalte de unas oscuro - las unas de los pies pueden estar pintados. ( Do not wear jewelry, eye makeup, lipstick, body lotion, or dark fingernail polish)  Puede usar desodorante ( you may wear deodorant)  Si va a ser ingresado despues de las [place], deje la [place] en el carro hasta que se le haya asignado una habitacion. ( If you are to be admitted after surgery, leave suitcase in car until your room has been assigned.)  A los pacientes que se les de de alta el mismo dia no se les permitira manejar a casa.  ( Patients discharged on the day of surgery will not be allowed to drive home)  Use ropa suelta y comoda de regreso a Technical sales engineercaso. ( wear loose comfortable clothes for ride home)  Firma del paciente (patient signature) ______________________________________

## 2016-11-05 NOTE — Progress Notes (Signed)
   Subjective:    Patient ID: Maria Daniel, female    DOB: 08/02/1981, 35 y.o.   MRN: [id number]  HPI Pt is here with report of lower pelvic pain that resulted from csection 6 months ago and desires pain medication.  Pain is over incision site.  Pt is scheduled for surgery on Monday.  Pain is reported as similar to prior pain.  Denies headache or other neuro symptoms.  Reports not taking medication for blood pressure due to it not being ready.      Review of Systems  Eyes: Negative for visual disturbance.  Gastrointestinal: Negative for abdominal pain, nausea and vomiting.  Genitourinary: Positive for pelvic pain. Negative for vaginal bleeding and vaginal pain.  Neurological: Negative for light-headedness and headaches.  All other systems reviewed and are negative.      Objective:   Physical Exam  Constitutional: She is oriented to person, place, and time. She appears well-developed and well-nourished. No distress.  HENT:  Head: Normocephalic and atraumatic.  Eyes: Pupils are equal, round, and reactive to light.  Neck: Normal range of motion. Neck supple. No thyromegaly present.  Abdominal: Bowel sounds are normal.  Palpated hardened mass over lateral edge of right incision   Neurological: She is alert and oriented to person, place, and time.  Skin: Skin is warm and dry.    Vitals:   11/05/16 1450  BP: (!) 138/114  Pulse: 90       Assessment & Plan:  Pelvic Pain Chronic Hypertension  Plan: RX Oxycodone 5 mg (#15) RX Ibuprofen 800 mg  RX Norvasc 10 mg qd Explained importance of getting BP meds today to get blood pressure within a normal range to prevent delay of surgery on Monday.  Eino FarberWalidah Kennith GainN Karim, CNM       Interpreter [phone]

## 2016-11-05 NOTE — Progress Notes (Signed)
Requesting more pain medicine  Need refill on Amlodipine has not taken in a week

## 2016-11-06 ENCOUNTER — Ambulatory Visit: Payer: Self-pay | Admitting: Obstetrics and Gynecology

## 2016-11-11 ENCOUNTER — Ambulatory Visit (HOSPITAL_COMMUNITY): Payer: Self-pay | Admitting: Anesthesiology

## 2016-11-11 ENCOUNTER — Ambulatory Visit (HOSPITAL_COMMUNITY)
Admission: RE | Admit: 2016-11-11 | Discharge: 2016-11-11 | Disposition: A | Discharge status: Home / Self Care | Payer: Self-pay | Source: Ambulatory Visit | Attending: Obstetrics and Gynecology | Admitting: Obstetrics and Gynecology

## 2016-11-11 ENCOUNTER — Other Ambulatory Visit: Payer: Self-pay

## 2016-11-11 ENCOUNTER — Ambulatory Visit (HOSPITAL_COMMUNITY): Payer: Self-pay

## 2016-11-11 ENCOUNTER — Encounter (HOSPITAL_COMMUNITY)
Admission: RE | Disposition: A | Discharge status: Home / Self Care | Payer: Self-pay | Source: Ambulatory Visit | Attending: Obstetrics and Gynecology

## 2016-11-11 ENCOUNTER — Encounter (HOSPITAL_COMMUNITY): Payer: Self-pay

## 2016-11-11 DIAGNOSIS — Z01811 Encounter for preprocedural respiratory examination: Secondary | ICD-10-CM

## 2016-11-11 DIAGNOSIS — E119 Type 2 diabetes mellitus without complications: Secondary | ICD-10-CM | POA: Insufficient documentation

## 2016-11-11 DIAGNOSIS — L988 Other specified disorders of the skin and subcutaneous tissue: Secondary | ICD-10-CM

## 2016-11-11 DIAGNOSIS — I10 Essential (primary) hypertension: Secondary | ICD-10-CM | POA: Insufficient documentation

## 2016-11-11 DIAGNOSIS — L989 Disorder of the skin and subcutaneous tissue, unspecified: Secondary | ICD-10-CM | POA: Insufficient documentation

## 2016-11-11 HISTORY — PX: LESION REMOVAL: SHX5196

## 2016-11-11 LAB — CBC
HCT: 35.7 % — ABNORMAL LOW (ref 36.0–46.0)
Hemoglobin: 11.9 g/dL — ABNORMAL LOW (ref 12.0–15.0)
MCH: 24.4 pg — ABNORMAL LOW (ref 26.0–34.0)
MCHC: 33.3 g/dL (ref 30.0–36.0)
MCV: 73.3 fL — AB (ref 78.0–100.0)
PLATELETS: 320 10*3/uL (ref 150–400)
RBC: 4.87 MIL/uL (ref 3.87–5.11)
RDW: 14.3 % (ref 11.5–15.5)
WBC: 6.8 10*3/uL (ref 4.0–10.5)

## 2016-11-11 LAB — TYPE AND SCREEN
ABO/RH(D): O POS
Antibody Screen: NEGATIVE

## 2016-11-11 LAB — HCG, SERUM, QUALITATIVE: Preg, Serum: NEGATIVE

## 2016-11-11 LAB — GLUCOSE, CAPILLARY: GLUCOSE-CAPILLARY: 89 mg/dL (ref 65–99)

## 2016-11-11 SURGERY — EXCISION, LESION, VAGINA
Anesthesia: General | Site: Abdomen

## 2016-11-11 MED ORDER — OXYCODONE HCL 5 MG PO TABS: 5.0000 mg | ORAL_TABLET | Freq: Four times a day (QID) | ORAL | 0 refills | Status: DC | PRN

## 2016-11-11 MED ORDER — PROPOFOL 10 MG/ML IV BOLUS
INTRAVENOUS | Status: AC
  Filled 2016-11-11: qty 20

## 2016-11-11 MED ORDER — FENTANYL CITRATE (PF) 100 MCG/2ML IJ SOLN
25.0000 ug | INTRAMUSCULAR | Status: DC | PRN
Start: 2016-11-11 — End: 2016-11-12
  Administered 2016-11-11 (×3): 25 ug via INTRAVENOUS

## 2016-11-11 MED ORDER — MIDAZOLAM HCL 2 MG/2ML IJ SOLN
INTRAMUSCULAR | Status: AC
  Filled 2016-11-11: qty 2

## 2016-11-11 MED ORDER — FENTANYL CITRATE (PF) 250 MCG/5ML IJ SOLN
INTRAMUSCULAR | Status: AC
  Filled 2016-11-11: qty 5

## 2016-11-11 MED ORDER — LACTATED RINGERS IV SOLN
INTRAVENOUS | Status: DC
  Administered 2016-11-11 (×2): via INTRAVENOUS

## 2016-11-11 MED ORDER — LIDOCAINE HCL (CARDIAC) 20 MG/ML IV SOLN
INTRAVENOUS | Status: AC
  Filled 2016-11-11: qty 5

## 2016-11-11 MED ORDER — MIDAZOLAM HCL 2 MG/2ML IJ SOLN
INTRAMUSCULAR | Status: DC | PRN
  Administered 2016-11-11: 2 mg via INTRAVENOUS

## 2016-11-11 MED ORDER — 0.9 % SODIUM CHLORIDE (POUR BTL) OPTIME
TOPICAL | Status: DC | PRN
  Administered 2016-11-11: 1000 mL

## 2016-11-11 MED ORDER — ONDANSETRON HCL 4 MG/2ML IJ SOLN
INTRAMUSCULAR | Status: AC
  Filled 2016-11-11: qty 2

## 2016-11-11 MED ORDER — ONDANSETRON HCL 4 MG/2ML IJ SOLN
INTRAMUSCULAR | Status: DC | PRN
  Administered 2016-11-11: 4 mg via INTRAVENOUS

## 2016-11-11 MED ORDER — SCOPOLAMINE 1 MG/3DAYS TD PT72: 1.0000 | MEDICATED_PATCH | Freq: Once | TRANSDERMAL | Status: DC

## 2016-11-11 MED ORDER — LIDOCAINE HCL (CARDIAC) 20 MG/ML IV SOLN
INTRAVENOUS | Status: DC | PRN
  Administered 2016-11-11: 60 mg via INTRAVENOUS

## 2016-11-11 MED ORDER — DEXTROSE IN LACTATED RINGERS 5 % IV SOLN: INTRAVENOUS | Status: DC

## 2016-11-11 MED ORDER — DEXAMETHASONE SODIUM PHOSPHATE 10 MG/ML IJ SOLN
INTRAMUSCULAR | Status: DC | PRN
  Administered 2016-11-11: 4 mg via INTRAVENOUS

## 2016-11-11 MED ORDER — BUPIVACAINE HCL (PF) 0.25 % IJ SOLN
INTRAMUSCULAR | Status: AC
  Filled 2016-11-11: qty 30

## 2016-11-11 MED ORDER — FENTANYL CITRATE (PF) 100 MCG/2ML IJ SOLN
INTRAMUSCULAR | Status: AC
  Filled 2016-11-11: qty 2

## 2016-11-11 MED ORDER — PROPOFOL 10 MG/ML IV BOLUS
INTRAVENOUS | Status: DC | PRN
  Administered 2016-11-11: 200 mg via INTRAVENOUS

## 2016-11-11 MED ORDER — FENTANYL CITRATE (PF) 100 MCG/2ML IJ SOLN
INTRAMUSCULAR | Status: AC
  Administered 2016-11-11: 25 ug via INTRAVENOUS
  Filled 2016-11-11: qty 2

## 2016-11-11 MED ORDER — FENTANYL CITRATE (PF) 100 MCG/2ML IJ SOLN
25.0000 ug | INTRAMUSCULAR | Status: AC | PRN
  Administered 2016-11-11: 25 ug via INTRAVENOUS

## 2016-11-11 MED ORDER — SOD CITRATE-CITRIC ACID 500-334 MG/5ML PO SOLN: 30.0000 mL | ORAL | Status: DC

## 2016-11-11 MED ORDER — IBUPROFEN 800 MG PO TABS: 800.0000 mg | ORAL_TABLET | Freq: Three times a day (TID) | ORAL | 0 refills | Status: DC | PRN

## 2016-11-11 MED ORDER — FENTANYL CITRATE (PF) 100 MCG/2ML IJ SOLN
INTRAMUSCULAR | Status: DC | PRN
  Administered 2016-11-11: 100 ug via INTRAVENOUS
  Administered 2016-11-11: 150 ug via INTRAVENOUS
  Administered 2016-11-11: 100 ug via INTRAVENOUS

## 2016-11-11 MED ORDER — CEFAZOLIN SODIUM-DEXTROSE 2-3 GM-% IV SOLR
INTRAVENOUS | Status: DC | PRN
  Administered 2016-11-11: 2 g via INTRAVENOUS

## 2016-11-11 MED ORDER — DEXAMETHASONE SODIUM PHOSPHATE 4 MG/ML IJ SOLN
INTRAMUSCULAR | Status: AC
  Filled 2016-11-11: qty 1

## 2016-11-11 SURGICAL SUPPLY — 17 items
BINDER ABDOMINAL 12 ML 46-62 (SOFTGOODS) ×3 IMPLANT
DERMABOND ADVANCED (GAUZE/BANDAGES/DRESSINGS) ×2
DERMABOND ADVANCED .7 DNX12 (GAUZE/BANDAGES/DRESSINGS) ×1 IMPLANT
DRSG OPSITE POSTOP 4X10 (GAUZE/BANDAGES/DRESSINGS) ×3 IMPLANT
GLOVE BIO SURGEON STRL SZ8 (GLOVE) ×3 IMPLANT
GLOVE BIOGEL PI IND STRL 7.0 (GLOVE) ×2 IMPLANT
GLOVE BIOGEL PI INDICATOR 7.0 (GLOVE) ×4
GLOVE SS BIOGEL STRL SZ 7.5 (GLOVE) ×2 IMPLANT
GLOVE SUPERSENSE BIOGEL SZ 7.5 (GLOVE) ×4
GOWN SURGICAL LARGE (GOWNS) ×9 IMPLANT
NEEDLE SPNL 22GX1.5 QUINCKE BK (NEEDLE) ×3 IMPLANT
PACK ABDOMINAL GYN (CUSTOM PROCEDURE TRAY) ×3 IMPLANT
SUT MNCRL AB 4-0 PS2 18 (SUTURE) ×6 IMPLANT
SUT MON AB 4-0 PS1 27 (SUTURE) ×3 IMPLANT
SUT VIC AB 2-0 CT1 (SUTURE) ×12 IMPLANT
SYR CONTROL 10ML LL (SYRINGE) ×3 IMPLANT
TOWEL OR 17X24 6PK STRL BLUE (TOWEL DISPOSABLE) ×6 IMPLANT

## 2016-11-11 NOTE — Anesthesia Postprocedure Evaluation (Signed)
Anesthesia Post Note  Patient: Maria Daniel  Procedure(s) Performed: Procedure(s) (LRB): EXCISION OF SUBCUTANEOUS LESION/ ABDOMEN (N/A)  Patient location during evaluation: PACU Anesthesia Type: General Level of consciousness: awake and alert Pain management: pain level controlled Vital Signs Assessment: post-procedure vital signs reviewed and stable Respiratory status: spontaneous breathing, nonlabored ventilation, respiratory function stable and patient connected to nasal cannula oxygen Cardiovascular status: blood pressure returned to baseline and stable Postop Assessment: no signs of nausea or vomiting Anesthetic complications: no Comments: BS 89        Last Vitals:  Vitals:   11/11/16 1815 11/11/16 1830  BP:    Pulse:    Resp: 20 20  Temp:  37.1 C    Last Pain:  Vitals:   11/11/16 1830  TempSrc:   PainSc: 5    Pain Goal: Patients Stated Pain Goal: 4 (11/11/16 1830)               Phillips Groutarignan, Veto Macqueen

## 2016-11-11 NOTE — H&P (Signed)
Maria Daniel is an 35 y.o. female who presents today for out patient removal of suspected endometrioma at c section scar. She had a c section on 04/23/16. C section was complicated by wound infection requiring a PICO. Pt has continued to have pain at and near her incision site since surgery. She has had a negative CT scan of the abd/pelvis. The pain has been so intense that it requires narcotic pain medication. She thinks the pain does intensity around her cycle but admits not paying particular attention to this, plus pt is breast feeding Her exam is reveals possible 2 areas of endometrioma.     Menstrual History: Menarche age: [age] No LMP recorded. Patient is not currently having periods (Reason: Lactating).    Past Medical History:  Diagnosis Date  . Complication of anesthesia    had trouble with spinal not working with last cs does not want a learner to do spinal  . Diabetes mellitus without complication (HCC)   . Dyspnea   . Gestational diabetes   . Hypertension    no meds patient denies  . Insomnia   . Knee pain   . Miscarriage     Past Surgical History:  Procedure Laterality Date  . CESAREAN SECTION    . CESAREAN SECTION  08/17/2012   Procedure: CESAREAN SECTION;  Surgeon: Lesly DukesKelly H Leggett, MD;  Location: [facility];  Service: Obstetrics;  Laterality: N/A;  . CESAREAN SECTION N/A 04/23/2016   Procedure: CESAREAN SECTION;  Surgeon: Catalina AntiguaPeggy Constant, MD;  Location: [facility];  Service: Obstetrics;  Laterality: N/A;    Family History  Problem Relation Age of Onset  . Hypertension Mother   . Kidney disease Father   . Multiple sclerosis Father     Social History:  reports that she has never smoked. She has never used smokeless tobacco. She reports that she does not drink alcohol or use drugs.  Allergies: No Known Allergies  Prescriptions Prior to Admission  Medication Sig Dispense Refill Last Dose  . amLODipine (NORVASC) 10 MG tablet Take 1 tablet (10 mg total)  by mouth daily. 30 tablet 6 11/11/2016 at 0900  . ibuprofen (ADVIL,MOTRIN) 800 MG tablet Take 1 tablet (800 mg total) by mouth every 8 (eight) hours as needed. 30 tablet 1 11/11/2016 at 1000  . oxyCODONE (OXY IR/ROXICODONE) 5 MG immediate release tablet Take 1 tablet (5 mg total) by mouth every 4 (four) hours as needed for severe pain. 30 tablet 0 Past Month at Unknown time    Review of Systems  Constitutional: Negative.   Respiratory: Positive for cough.   Cardiovascular: Negative.   Gastrointestinal: Negative.   Genitourinary: Negative.     Blood pressure (!) 158/93, pulse (!) 117, temperature 98.5 F (36.9 C), temperature source Oral, resp. rate (!) 24, SpO2 100 %, currently breastfeeding. Physical Exam  Constitutional: She appears well-developed and well-nourished.  Cardiovascular: Normal rate.   Respiratory: Effort normal.  GI: Soft. Bowel sounds are normal.  2 small approx 2 x 2 cm areas above c section incision, firm mobile, tender    No results found for this or any previous visit (from the past 24 hour(s)).  No results found.  Assessment/Plan: Suspect endometriomas of c section scar  Treatment options were reviewed with pt. Pt desires definite treatment. Excision of lesions in question were reviewed with pt. R/B/post op care discussed. Pt verbalized understand and desires to proceed.  Hermina StaggersMichael L Jalaysha Skilton 11/11/2016, 1:33 PM

## 2016-11-11 NOTE — Anesthesia Preprocedure Evaluation (Signed)
Anesthesia Evaluation  Patient identified by MRN, date of birth, ID band Patient awake    Reviewed: Allergy & Precautions, H&P , Patient's Chart, lab work & pertinent test results, reviewed documented beta blocker date and time   Airway Mallampati: II  TM Distance: >3 FB Neck ROM: full    Dental no notable dental hx.    Pulmonary    Pulmonary exam normal breath sounds clear to auscultation       Cardiovascular hypertension,  Rhythm:regular Rate:Normal     Neuro/Psych    GI/Hepatic   Endo/Other  diabetes  Renal/GU      Musculoskeletal   Abdominal   Peds  Hematology   Anesthesia Other Findings   Reproductive/Obstetrics                             Anesthesia Physical Anesthesia Plan  ASA: II  Anesthesia Plan:    Post-op Pain Management:    Induction: Intravenous  Airway Management Planned: LMA  Additional Equipment:   Intra-op Plan:   Post-operative Plan:   Informed Consent: I have reviewed the patients History and Physical, chart, labs and discussed the procedure including the risks, benefits and alternatives for the proposed anesthesia with the patient or authorized representative who has indicated his/her understanding and acceptance.   Dental Advisory Given and Dental advisory given  Plan Discussed with: CRNA and Surgeon  Anesthesia Plan Comments: (Discussed GA with LMA, possible sore throat, potential need to switch to ETT, N/V, pulmonary aspiration. Questions answered. )        Anesthesia Quick Evaluation  

## 2016-11-11 NOTE — OR Nursing (Signed)
SPECIMEN WEIGHT = 106.8

## 2016-11-11 NOTE — OR Nursing (Signed)
Pt here in SS for pre-op.  SOB and tachycardic.States so weak, she fell yesterday.Weight loss of 82lbs over last 6 months.  Points to pain lower abdomen of 7/10 [facility] Seems to have hard time catching breath. Dr. Krista BlueSinger called to notify.  Will order chest x-ray. Labs drawn and sent.

## 2016-11-11 NOTE — Discharge Instructions (Signed)
Cuidado de las heridas suturadas  (Sutured Wound Care)  Las suturas son puntos que pueden usarse para cerrar heridas. El cuidado correcto de las heridas puede ayudar a evitar el dolor y a prevenir las infecciones. Adems puede ayudar a que la cicatrizacin sea ms rpida.  CMO CUIDAR LA HERIDA SUTURADA  Cuidados de la herida   Mantenga la herida limpia y seca.   Si le colocaron una venda (vendaje), cmbiela por lo menos una vez al da o como se lo haya indicado el mdico. Tambin debe cambiarla si se moja o se ensucia.   Mantenga la herida completamente seca durante las primeras 24horas o como se lo haya indicado el mdico. Transcurrido ese tiempo, puede ducharse o tomar baos de inmersin. No obstante, asegrese de no sumergir la herida en agua hasta que le hayan quitado las suturas.   Limpie la herida una vez al da o como se lo haya indicado el mdico.  ? Lave la herida con agua y jabn.  ? Enjuguela con agua para quitar todo el jabn.  ? Seque dando palmaditas con una toalla limpia. No frote la herida.   Despus de limpiar la herida, aplique sobre esta una capa delgada de ungento con antibitico como se lo haya indicado el mdico. El ungento se aplica con estos fines:  ? Ayuda a prevenir una infeccin.  ? Evita que la venda se adhiera a la herida.   El retiro de las suturas debe hacerse como se lo haya indicado el mdico.  Instrucciones generales   Tome o aplquese los medicamentos solamente como se lo haya indicado el mdico.   Para ayudar a evitar la formacin de cicatrices, cbrase la herida con pantalla solar siempre que est al aire libre, despus de que se hayan retirado las suturas y la herida haya cicatrizado. Use una pantalla solar con factor de proteccin solar (FPS) de por lo menos30.   Si le recetaron antibiticos o un ungento, asegrese de terminarlos, incluso si comienza a sentirse mejor.   No se rasque ni se toque la herida.   Concurra a todas las visitas de control como se lo  haya indicado el mdico. Esto es importante.   Controle la herida todos los das para detectar signos de infeccin. Est atento a lo siguiente:  ? Dolor, hinchazn o enrojecimiento.  ? Lquido, sangre o pus.   Cuando est sentado o acostado, eleve la zona de la lesin por encima del nivel del corazn, si es posible.   No estire la herida.   Beba suficiente lquido para mantener el pis (orina) claro o de color amarillo plido.  SOLICITE AYUDA SI:   Le aplicaron la antitetnica y en el lugar de la insercin de la aguja tiene alguno de estos signos:  ? Hinchazn.  ? Dolor intenso.  ? Enrojecimiento.  ? Hemorragia.   Tiene fiebre.   La herida estaba cerrada y se abre.   Percibe que sale mal olor de la herida.   Nota un cuerpo extrao en la herida, como un trozo de madera o vidrio.   Los medicamentos no le alivian el dolor.   Tiene alguno de estos signos en el lugar de la herida.  ? Aumenta el enrojecimiento.  ? Aumenta la hinchazn.  ? Aumenta el dolor.   Alguna de estas sustancias emana de la herida.  ? Lquido.  ? Sangre.  ? Pus.   Observa que la piel cerca de la herida cambia de color.   Debe cambiar la venda   con frecuencia debido a que hay secrecin de lquido, sangre o pus de la herida.   Tiene una erupcin cutnea nueva.   Tiene entumecimiento alrededor de la herida.    SOLICITE AYUDA DE INMEDIATO SI:   Hay mucha hinchazn alrededor de la herida.   El dolor empeora repentinamente y es muy intenso.   Tiene bultos dolorosos cerca de la herida o en la piel en cualquier parte del cuerpo.   Tiene una lnea roja que sale de la herida.   Tiene la herida en la mano o en el pie, y no puede mover los dedos con normalidad.   La herida est en la mano o en el pie y observa que los dedos tienen un tono plido o azulado.    Esta informacin no tiene como fin reemplazar el consejo del mdico. Asegrese de hacerle al mdico cualquier pregunta que tenga.  Document Released: 05/27/2011 Document Revised:  03/28/2015 Document Reviewed: 06/23/2013  Elsevier Interactive Patient Education  2017 Elsevier Inc.

## 2016-11-11 NOTE — Anesthesia Procedure Notes (Signed)
Procedure Name: LMA Insertion Date/Time: 11/11/2016 3:32 PM Performed by: Shanon PayorGREGORY, Mandell Pangborn M Pre-anesthesia Checklist: Patient identified, Emergency Drugs available, Suction available, Patient being monitored and Timeout performed Patient Re-evaluated:Patient Re-evaluated prior to inductionOxygen Delivery Method: Circle system utilized Preoxygenation: Pre-oxygenation with 100% oxygen Intubation Type: IV induction LMA: LMA inserted LMA Size: 4.0 Number of attempts: 1 Placement Confirmation: positive ETCO2 and breath sounds checked- equal and bilateral Tube secured with: Tape Dental Injury: Teeth and Oropharynx as per pre-operative assessment

## 2016-11-11 NOTE — Op Note (Signed)
Maria Daniel PROCEDURE DATE: 11/11/2016  PREOPERATIVE DIAGNOSES: Incisional pain POSTOPERATIVE DIAGNOSES: The same PROCEDURE:Excision of subcutaneous lesion SURGEON:  Dr Nettie ElmMichael Kadisha Goodine ANESTHESIOLOGIST: As recorded  INDICATIONS: 35 y.o. [id number] here for excision of subcutaneous incisional lesion.  Risks of surgery were discussed with the patient including but not limited to: bleeding which may require transfusion or reoperation; infection which may require antibiotics; need for additional procedures; thromboembolic phenomenon, incisional problems and other postoperative/anesthesia complications. Written informed consent was obtained.    FINDINGS:  Multiple areas of hard subcutaneous lesions noted above c section incision.  ANESTHESIA:    General INTRAVENOUS FLUIDS:1000  ml ESTIMATED BLOOD LOSS:25 ml SPECIMENS: subcutaneous tissue COMPLICATIONS: None immediate  PROCEDURE IN DETAIL:  The patient received intravenous antibiotics and had sequential compression devices applied to her lower extremities while in the preoperative area.  She was then taken to the operating room where general anesthesia was administered and was found to be adequate.  She was placed in the dorsal lithotomy/supine position, and was prepped and draped in a sterile manner.  After an adequate timeout was performed, attention was turned to the c section incision. Scalpel was used to make an incisional along the old c section incision. The areas in questioned were palpated. The subcutaneous tissue was excised around these areas with a margin of 1/2 cm. The areas were noted to be hard like scar tissue. Small bleeders were controlled with Bovine. Hemostasis was noted. The subcutaneous tissue was then closed in a layered fashion with 2/0 Vicryl. The skin was closed with 4/0 Vicryl. 10 cc of .25% Marcaine was injected. Honeycomb dressing and abdominal binder was placed. All counts were corrected. Pt was extubated and taken  to the PACU in stable condition.

## 2016-11-11 NOTE — Anesthesia Postprocedure Evaluation (Signed)
Anesthesia Post Note  Patient: Maria Daniel  Procedure(s) Performed: Procedure(s) (LRB): EXCISION OF SUBCUTANEOUS LESION/ ABDOMEN (N/A)  Patient location during evaluation: PACU Anesthesia Type: General Level of consciousness: sedated Pain management: satisfactory to patient Vital Signs Assessment: post-procedure vital signs reviewed and stable Respiratory status: spontaneous breathing Cardiovascular status: stable Anesthetic complications: no       Last Vitals:  Vitals:   11/11/16 1930 11/11/16 2024  BP: 136/70 140/79  Pulse: (!) 120 (!) 120  Resp: 20 20  Temp: 37.2 C 36.8 C    Last Pain:  Vitals:   11/11/16 2024  TempSrc:   PainSc: 1                  Favor Kreh EDWARD

## 2016-11-11 NOTE — Transfer of Care (Signed)
Immediate Anesthesia Transfer of Care Note  Patient: Maria Daniel  Procedure(s) Performed: Procedure(s): EXCISION OF SUBCUTANEOUS LESION/ ABDOMEN (N/A)  Patient Location: PACU  Anesthesia Type:General  Level of Consciousness: awake, alert  and oriented  Airway & Oxygen Therapy: Patient Spontanous Breathing and Patient connected to nasal cannula oxygen  Post-op Assessment: Report given to RN and Post -op Vital signs reviewed and stable  Post vital signs: Reviewed and stable  Last Vitals:  Vitals:   11/11/16 1323 11/11/16 1402  BP: (!) 158/93 (!) 151/114  Pulse: (!) 117 (!) 135  Resp: (!) 24   Temp: 36.9 C     Last Pain:  Vitals:   11/11/16 1323  TempSrc: Oral      Patients Stated Pain Goal: 7 (11/11/16 1323)  Complications: No apparent anesthesia complications

## 2016-11-11 NOTE — Progress Notes (Signed)
Per Dr. Alysia PennaErvin pt will need an appt scheduled with Family Medicine for elevated BP.  [facility] and received appt 11/14/16 @ 0900.   Provider to notify pt of appt time.

## 2016-11-13 ENCOUNTER — Encounter (HOSPITAL_COMMUNITY): Payer: Self-pay | Admitting: Obstetrics and Gynecology

## 2016-11-13 ENCOUNTER — Ambulatory Visit: Payer: Self-pay | Admitting: Obstetrics and Gynecology

## 2016-11-13 DIAGNOSIS — Z5189 Encounter for other specified aftercare: Secondary | ICD-10-CM | POA: Insufficient documentation

## 2016-11-13 NOTE — Progress Notes (Signed)
Pt seen today for wound check. She is s/p excision of scar tissue from c section incision on Monday. She reports some pain but controlled with pain medication  PE AF VSS Abd honeycomb dressing removed, incision healing well, some bruising noted  New honeycomb dressing applied  A/P wound check  Doing well. Continue with present management  F/U with routine post op appt.

## 2016-11-13 NOTE — Addendum Note (Signed)
Addendum  created 11/13/16 1535 by Phillips GroutPeter Rocko Fesperman, MD   SmartForm saved

## 2016-11-14 ENCOUNTER — Encounter: Payer: Self-pay | Admitting: Family Medicine

## 2016-11-14 ENCOUNTER — Ambulatory Visit: Payer: Self-pay | Attending: Family Medicine | Admitting: Family Medicine

## 2016-11-14 VITALS — BP 111/82 | HR 96 | Temp 99.0°F | Resp 16 | Wt 193.2 lb

## 2016-11-14 DIAGNOSIS — E119 Type 2 diabetes mellitus without complications: Secondary | ICD-10-CM | POA: Insufficient documentation

## 2016-11-14 DIAGNOSIS — Z0001 Encounter for general adult medical examination with abnormal findings: Secondary | ICD-10-CM | POA: Insufficient documentation

## 2016-11-14 DIAGNOSIS — R531 Weakness: Secondary | ICD-10-CM | POA: Insufficient documentation

## 2016-11-14 DIAGNOSIS — I10 Essential (primary) hypertension: Secondary | ICD-10-CM | POA: Insufficient documentation

## 2016-11-14 DIAGNOSIS — Z8632 Personal history of gestational diabetes: Secondary | ICD-10-CM

## 2016-11-14 DIAGNOSIS — M79672 Pain in left foot: Secondary | ICD-10-CM | POA: Insufficient documentation

## 2016-11-14 DIAGNOSIS — R29898 Other symptoms and signs involving the musculoskeletal system: Secondary | ICD-10-CM

## 2016-11-14 LAB — BASIC METABOLIC PANEL
BUN: 10 mg/dL (ref 7–25)
CO2: 23 mmol/L (ref 20–31)
CREATININE: 0.28 mg/dL — AB (ref 0.50–1.10)
Calcium: 10 mg/dL (ref 8.6–10.2)
Chloride: 98 mmol/L (ref 98–110)
GLUCOSE: 82 mg/dL (ref 65–99)
Potassium: 4.2 mmol/L (ref 3.5–5.3)
Sodium: 136 mmol/L (ref 135–146)

## 2016-11-14 LAB — POCT GLYCOSYLATED HEMOGLOBIN (HGB A1C): Hemoglobin A1C: 5.5

## 2016-11-14 MED ORDER — LABETALOL HCL 100 MG PO TABS: 100.0000 mg | ORAL_TABLET | Freq: Two times a day (BID) | ORAL | 2 refills | Status: DC

## 2016-11-14 NOTE — Progress Notes (Signed)
Subjective:  Patient ID: Maria Daniel, female    DOB: 12/30/1980  Age: 35 y.o. MRN: [id number]  CC: New Patient (Initial Visit) and Hypertension  HPI Maria Lyonsrma Daniel comes to office for HTN and abdominal pain. She denies any CP, SOB, or BLE swelling. She does report a history of leg cramps for 3 years. She c/o falling yesterday and her anterior left foot bending forward. She c/o 6/7 pain with dorsiflexion. She reports inability to bear full weight on her left foot. Was seen by her ob/gyn on yesterday for wound care check. Wound dressing is dry, intact, with scant drainage. Was told to continue take pain medications as prescribed by her ob-gyn.   Outpatient Medications Prior to Visit  Medication Sig Dispense Refill  . ibuprofen (ADVIL,MOTRIN) 800 MG tablet Take 1 tablet (800 mg total) by mouth every 8 (eight) hours as needed. 30 tablet 0  . oxyCODONE (OXY IR/ROXICODONE) 5 MG immediate release tablet Take 1-2 tablets (5-10 mg total) by mouth every 6 (six) hours as needed for severe pain. 30 tablet 0  . amLODipine (NORVASC) 10 MG tablet Take 1 tablet (10 mg total) by mouth daily. 30 tablet 6  . ibuprofen (ADVIL,MOTRIN) 800 MG tablet Take 1 tablet (800 mg total) by mouth every 8 (eight) hours as needed. (Patient not taking: Reported on 11/13/2016) 30 tablet 1  . oxyCODONE (OXY IR/ROXICODONE) 5 MG immediate release tablet Take 1 tablet (5 mg total) by mouth every 4 (four) hours as needed for severe pain. (Patient not taking: Reported on 11/13/2016) 30 tablet 0   Facility-Administered Medications Prior to Visit  Medication Dose Route Frequency Provider Last Rate Last Dose  . influenza  inactive virus vaccine (FLUZONE/FLUARIX) injection 0.5 mL  0.5 mL Intramuscular Once Adam PhenixJames G Arnold, MD        ROS Review of Systems  Respiratory: Negative.   Cardiovascular: Negative.   Gastrointestinal: Positive for abdominal pain.  Musculoskeletal: Positive for myalgias (left anterior foot  pain).    Objective:  BP 111/82 (BP Location: Right Arm, Patient Position: Sitting, Cuff Size: Large)   Pulse 96   Temp 99 F (37.2 C) (Oral)   Resp 16   Wt 193 lb 3.2 oz (87.6 kg)   SpO2 95%   BMI 32.15 kg/m   BP/Weight 11/14/2016 11/13/2016 11/11/2016  Systolic BP 111 119 140  Diastolic BP 82 75 79  Wt. (Lbs) 193.2 193.8 -  BMI 32.15 32.25 -    Physical Exam  Constitutional: She is oriented to person, place, and time. She appears well-developed and well-nourished.  Cardiovascular: Normal rate, regular rhythm and normal heart sounds.   Pulmonary/Chest: Breath sounds normal.  Abdominal: Soft. Bowel sounds are normal. There is tenderness (recent hx. lower abdominal incision).  Musculoskeletal:       Left foot: There is decreased range of motion (decreased ability to bear weight observed) and tenderness. There is no swelling, normal capillary refill and no deformity.  Neurological: She is alert and oriented to person, place, and time.  Skin: Skin is warm and dry. No erythema.  Psychiatric: She has a normal mood and affect. Her behavior is normal. Thought content normal.   Assessment & Plan:   Problem List Items Addressed This Visit      Endocrine   Diabetes mellitus without complication (HCC)    Other Visit Diagnoses    Essential hypertension    -  Primary   Relevant Medications   labetalol (NORMODYNE) 100 MG tablet   Acute foot  pain, left       Relevant Orders   DG Foot 2 Views Left   Complaints of leg weakness       Relevant Orders   Basic Metabolic Panel   History of gestational diabetes       Relevant Orders   HgB A1c      Meds ordered this encounter  Medications  . labetalol (NORMODYNE) 100 MG tablet    Sig: Take 1 tablet (100 mg total) by mouth 2 (two) times daily.    Dispense:  30 tablet    Refill:  2    Order Specific Question:   Supervising Provider    Answer:   Quentin AngstJEGEDE, OLUGBEMIGA E [id number]]    Follow-up: Return in about 3 months (around  02/12/2017) for Hypertension .   Maria BarkMandesia R Venetta Knee FNP

## 2016-11-14 NOTE — Progress Notes (Signed)
Pt is in the office today for new patient visit Pt states her pain level is a 7 Pt states her pain is coming from her stomach Pt states she had a c-section 6 months ago

## 2016-11-14 NOTE — Patient Instructions (Addendum)
Follow up in 1 week for blood pressure check with the nurse.  Hipertensin (Hypertension) El trmino hipertensin es otra forma de denominar a la presin arterial elevada. La presin arterial elevada fuerza al corazn a trabajar ms para bombear la sangre. Una lectura de la presin arterial consta de dos nmeros: uno ms alto sobre uno ms bajo (por ejemplo, 110/72). CUIDADOS EN EL HOGAR  Haga que el mdico le tome nuevamente la presin arterial.  Tome los medicamentos solamente como se lo haya indicado el mdico. Siga cuidadosamente las indicaciones. Los medicamentos pierden eficacia si omite dosis. El hecho de omitir las dosis tambin Lesoth[place]umenta el riesgo de otros problemas.  No fume.  Contrlese la presin arterial en su casa como se lo haya indicado el mdico. SOLICITE AYUDA SI:  Piensa que tiene una reaccin a los medicamentos que est tomando.  Tiene mareos o dolores de cabeza reiterados.  Se le inflaman (hinchan) los tobillos.  Tiene problemas de visin. SOLICITE AYUDA DE INMEDIATO SI:  Tiene un dolor de cabeza muy intenso y est confundido.  Se siente dbil, aturdido o se desmaya.  Tiene dolor en el pecho o el estmago (abdominal).  Tiene vmitos.  No puede respirar Kimberly-Clarkmuy bien. ASEGRESE DE QUE:  Comprende estas instrucciones.  Controlar su afeccin.  Recibir ayuda de inmediato si no mejora o si empeora. Esta informacin no tiene Theme park managercomo fin reemplazar el consejo del mdico. Asegrese de hacerle al mdico cualquier pregunta que tenga. Document Released: 05/01/2010 Document Revised: 11/16/2013 Document Reviewed: 09/03/2013 Elsevier Interactive Patient Education  2017 [employer] Inc[employer]

## 2016-11-17 ENCOUNTER — Encounter (HOSPITAL_COMMUNITY): Payer: Self-pay

## 2016-11-17 ENCOUNTER — Inpatient Hospital Stay (HOSPITAL_COMMUNITY)
Admission: AD | Admit: 2016-11-17 | Discharge: 2016-11-17 | Disposition: A | Discharge status: Home / Self Care | Payer: Self-pay | Source: Ambulatory Visit | Attending: Obstetrics & Gynecology | Admitting: Obstetrics & Gynecology

## 2016-11-17 DIAGNOSIS — R109 Unspecified abdominal pain: Secondary | ICD-10-CM | POA: Insufficient documentation

## 2016-11-17 DIAGNOSIS — Z79899 Other long term (current) drug therapy: Secondary | ICD-10-CM | POA: Insufficient documentation

## 2016-11-17 DIAGNOSIS — T814XXA Infection following a procedure, initial encounter: Secondary | ICD-10-CM | POA: Insufficient documentation

## 2016-11-17 DIAGNOSIS — T8140XA Infection following a procedure, unspecified, initial encounter: Secondary | ICD-10-CM

## 2016-11-17 LAB — CBC WITH DIFFERENTIAL/PLATELET
BASOS ABS: 0 10*3/uL (ref 0.0–0.1)
Basophils Relative: 0 %
EOS PCT: 2 %
Eosinophils Absolute: 0.1 10*3/uL (ref 0.0–0.7)
HEMATOCRIT: 35.1 % — AB (ref 36.0–46.0)
Hemoglobin: 12.2 g/dL (ref 12.0–15.0)
LYMPHS ABS: 3.5 10*3/uL (ref 0.7–4.0)
LYMPHS PCT: 43 %
MCH: 24.9 pg — AB (ref 26.0–34.0)
MCHC: 34.8 g/dL (ref 30.0–36.0)
MCV: 71.8 fL — AB (ref 78.0–100.0)
MONO ABS: 0.9 10*3/uL (ref 0.1–1.0)
Monocytes Relative: 11 %
NEUTROS ABS: 3.7 10*3/uL (ref 1.7–7.7)
Neutrophils Relative %: 45 %
Platelets: 365 10*3/uL (ref 150–400)
RBC: 4.89 MIL/uL (ref 3.87–5.11)
RDW: 14 % (ref 11.5–15.5)
WBC: 8.2 10*3/uL (ref 4.0–10.5)

## 2016-11-17 MED ORDER — METRONIDAZOLE 500 MG PO TABS
500.0000 mg | ORAL_TABLET | Freq: Once | ORAL | Status: AC
  Administered 2016-11-17: 500 mg via ORAL
  Filled 2016-11-17: qty 1

## 2016-11-17 MED ORDER — METRONIDAZOLE 500 MG PO TABS: 500.0000 mg | ORAL_TABLET | Freq: Two times a day (BID) | ORAL | 0 refills | Status: DC

## 2016-11-17 MED ORDER — KETOROLAC TROMETHAMINE 10 MG PO TABS: 10.0000 mg | ORAL_TABLET | Freq: Four times a day (QID) | ORAL | 0 refills | Status: DC | PRN

## 2016-11-17 MED ORDER — SULFAMETHOXAZOLE-TRIMETHOPRIM 800-160 MG PO TABS: 1.0000 | ORAL_TABLET | Freq: Two times a day (BID) | ORAL | 0 refills | Status: DC

## 2016-11-17 MED ORDER — SODIUM CHLORIDE 0.9 % IR SOLN
Freq: Once | Status: AC
  Administered 2016-11-17: 04:00:00
  Filled 2016-11-17: qty 500

## 2016-11-17 MED ORDER — OXYCODONE-ACETAMINOPHEN 5-325 MG PO TABS: 1.0000 | ORAL_TABLET | Freq: Four times a day (QID) | ORAL | 0 refills | Status: DC | PRN

## 2016-11-17 MED ORDER — SULFAMETHOXAZOLE-TRIMETHOPRIM 800-160 MG PO TABS
1.0000 | ORAL_TABLET | Freq: Once | ORAL | Status: AC
  Administered 2016-11-17: 1 via ORAL
  Filled 2016-11-17: qty 1

## 2016-11-17 MED ORDER — ONDANSETRON 8 MG PO TBDP
8.0000 mg | ORAL_TABLET | Freq: Once | ORAL | Status: AC | PRN
  Administered 2016-11-17: 8 mg via ORAL
  Filled 2016-11-17: qty 1

## 2016-11-17 MED ORDER — KETOROLAC TROMETHAMINE 10 MG PO TABS
10.0000 mg | ORAL_TABLET | Freq: Once | ORAL | Status: AC
  Administered 2016-11-17: 10 mg via ORAL
  Filled 2016-11-17: qty 1

## 2016-11-17 MED ORDER — OXYCODONE-ACETAMINOPHEN 5-325 MG PO TABS
1.0000 | ORAL_TABLET | Freq: Once | ORAL | Status: AC
  Administered 2016-11-17: 1 via ORAL
  Filled 2016-11-17: qty 1

## 2016-11-17 NOTE — MAU Provider Note (Signed)
Chief Complaint: Post-op Problem   First Provider Initiated Contact with Patient 11/17/16 0208     SUBJECTIVE HPI: Maria Daniel is a 35 y.o. [id number] at 6 days S/P excision of mass from C/S scar who presents to [facility] reporting malodorous drainage from incision, increased pain and concern for infection. Had C/S in May 2017. Developed incision pain and mass few months afterward. 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   Location: incisions Quality: Sore Severity: 8/10 on pain scale Duration: 2-3 days Context: Post-op Timing: constant Modifying factors: Oxycodone no longer adequately treating pain  Associated signs and symptoms: Neg for fever, chills, malaise, incision bleeding or separation.   Past Medical History:  Diagnosis Date  . Complication of anesthesia    had trouble with spinal not working with last cs does not want a learner to do spinal  . Diabetes mellitus without complication (HCC)   . Dyspnea   . Gestational diabetes   . Hypertension    no meds patient denies  . Insomnia   . Knee pain   . Miscarriage    OB History  Gravida Para Term Preterm AB Living  5 3 3   2 3   SAB TAB Ectopic Multiple Live Births  2     0 3    # Outcome Date GA Lbr Len/2nd Weight Sex Delivery Anes PTL Lv  5 Term 04/23/16 8230w3d  8 lb 15.2 oz (4.06 kg) M CS-LTranv Spinal  LIV  4 SAB 05/2014             Birth Comments: System Generated. Please review and update pregnancy details.  3 Term 08/17/12 6714w0d   M CS-LTranv Other  LIV  2 SAB 11/2002          1 Term 06/25/02 5148w0d 20:00 6 lb 8 oz (2.948 kg) M CS-Unspec EPI  LIV     Birth Comments: arrest of dilation     Past Surgical History:  Procedure Laterality Date  . CESAREAN SECTION    . CESAREAN SECTION  08/17/2012   Procedure: CESAREAN SECTION;  Surgeon: Lesly DukesKelly H Leggett, MD;  Location: [facility];  Service: Obstetrics;  Laterality: N/A;  . CESAREAN SECTION N/A 04/23/2016   Procedure:  CESAREAN SECTION;  Surgeon: Catalina AntiguaPeggy Constant, MD;  Location: [facility];  Service: Obstetrics;  Laterality: N/A;  . LESION REMOVAL N/A 11/11/2016   Procedure: EXCISION OF SUBCUTANEOUS LESION/ ABDOMEN;  Surgeon: Hermina StaggersMichael L Ervin, MD;  Location: [facility];  Service: Gynecology;  Laterality: N/A;   Social History   Social History  . Marital status: Significant Other    Spouse name: N/A  . Number of children: N/A  . Years of education: N/A   Occupational History  . Not on file.   Social History Main Topics  . Smoking status: Never Smoker  . Smokeless tobacco: Never Used  . Alcohol use No     Comment: socially  . Drug use: No  . Sexual activity: Yes    Birth control/ protection: None   Other Topics Concern  . Not on file   Social History Narrative  . No narrative on file   Family History  Problem Relation Age of Onset  . Hypertension Mother   . Kidney disease Father   . Multiple sclerosis Father    Current Facility-Administered Medications on File Prior to Encounter  Medication Dose Route Frequency Provider Last Rate Last Dose  . influenza  inactive virus vaccine (FLUZONE/FLUARIX) injection 0.5 mL  0.5 mL Intramuscular Once Adam Phenix, MD       Current Outpatient Prescriptions on File Prior to Encounter  Medication Sig Dispense Refill  . oxyCODONE (OXY IR/ROXICODONE) 5 MG immediate release tablet Take 1-2 tablets (5-10 mg total) by mouth every 6 (six) hours as needed for severe pain. 30 tablet 0  . ibuprofen (ADVIL,MOTRIN) 800 MG tablet Take 1 tablet (800 mg total) by mouth every 8 (eight) hours as needed. (Patient not taking: Reported on 11/13/2016) 30 tablet 1  . ibuprofen (ADVIL,MOTRIN) 800 MG tablet Take 1 tablet (800 mg total) by mouth every 8 (eight) hours as needed. 30 tablet 0  . labetalol (NORMODYNE) 100 MG tablet Take 1 tablet (100 mg total) by mouth 2 (two) times daily. 30 tablet 2  . oxyCODONE (OXY IR/ROXICODONE) 5 MG immediate release tablet Take 1 tablet  (5 mg total) by mouth every 4 (four) hours as needed for severe pain. (Patient not taking: Reported on 11/13/2016) 30 tablet 0   No Known Allergies  I have reviewed patient's Past Medical Hx, Surgical Hx, Family Hx, Social Hx, medications and allergies.   Review of Systems  Constitutional: Negative for chills, diaphoresis, fatigue and fever.  Gastrointestinal: Positive for abdominal pain (insicion).  Skin:       Incision drainage    OBJECTIVE Patient Vitals for the past 24 hrs:  BP Temp Pulse Resp SpO2 Height Weight  11/17/16 0148 145/83 98.5 F (36.9 C) 120 18 99 % 5\' 3"  (1.6 m) 182 lb 9.6 oz (82.8 kg)   Constitutional: Well-developed, well-nourished female in mild distress.  Cardiovascular: Mild tachycardia Respiratory: normal rate and effort.  GI: Abd soft, honeycomb dressing in place. 1/4 saturated w/ light brown extremely malodorous fluid. Dressing removed. Incision intact. 2 cm patch of superficial break in dermis w/ purulent drainage. Erythema a swelling present 3x10 cm area above incision. Scant rash present scattered above and below incision possibly 2/2 prolonged contact w/ wet dressing.  Neurologic: Alert and oriented x 4.  GU: Deferred  LAB RESULTS Results for orders placed or performed during the hospital encounter of 11/17/16 (from the past 24 hour(s))  CBC with Differential/Platelet     Status: Abnormal   Collection Time: 11/17/16  2:06 AM  Result Value Ref Range   WBC 8.2 4.0 - 10.5 K/uL   RBC 4.89 3.87 - 5.11 MIL/uL   Hemoglobin 12.2 12.0 - 15.0 g/dL   HCT 16.1 (L) 09.6 - 04.5 %   MCV 71.8 (L) 78.0 - 100.0 fL   MCH 24.9 (L) 26.0 - 34.0 pg   MCHC 34.8 30.0 - 36.0 g/dL   RDW 40.9 81.1 - 91.4 %   Platelets 365 150 - 400 K/uL   Neutrophils Relative % 45 %   Neutro Abs 3.7 1.7 - 7.7 K/uL   Lymphocytes Relative 43 %   Lymphs Abs 3.5 0.7 - 4.0 K/uL   Monocytes Relative 11 %   Monocytes Absolute 0.9 0.1 - 1.0 K/uL   Eosinophils Relative 2 %   Eosinophils  Absolute 0.1 0.0 - 0.7 K/uL   Basophils Relative 0 %   Basophils Absolute 0.0 0.0 - 0.1 K/uL    IMAGING NA  MAU COURSE Orders Placed This Encounter  Procedures  . CBC with Differential/Platelet  . Discharge patient   Meds ordered this encounter  Medications  . sulfamethoxazole-trimethoprim (BACTRIM DS,SEPTRA DS) 800-160 MG per tablet 1 tablet  . metroNIDAZOLE (FLAGYL) tablet 500 mg  . ondansetron (ZOFRAN-ODT) disintegrating tablet 8 mg  .  hydrogen peroxide 3 % 10 application in sodium chloride irrigation 0.9 % 500 mL irrigation  . ketorolac (TORADOL) tablet 10 mg  . oxyCODONE-acetaminophen (PERCOCET/ROXICET) 5-325 MG per tablet 1 tablet   Discussed Hx, exam, labs w/ Dr. Macon LargeAnyanwu. Recommends Bactrim for MRSA coverage and Flagyl for gram neg coverage. F/U w/ Dr. Alysia PennaErvin or any provider in ~3 days.  MDM  ASSESSMENT 1. Postoperative infection, initial encounter     PLAN Discharge home in stable condition. Infection precautions Pt requesting F/U 12/28 due to spouse's work schedule.  Follow-up Information    [facility].   Specialty:  Obstetrics and Gynecology Why:  at 1:40 for incision check Contact information: [address] [place] [phone]       [facility] Follow up.   Why:  For Gynecology emergencies Contact information: [address] [id number] [place] [phone] [phone]       Arrie SenateMandesia Hairston, FNP Follow up.   Specialty:  Family Medicine Why:  For concerns about weakness and falls.  Contact information: [address] [place] [id number] [phone]        [facility] Follow up.   Specialty:  Emergency Medicine Why:  as needed in emergencies Contact information: [address] [id number] mc [place] [phone]          Allergies as of 11/17/2016   No Known Allergies     Medication List    STOP taking these medications   ibuprofen 800 MG tablet Commonly known as:  ADVIL,MOTRIN   oxyCODONE 5 MG immediate release tablet Commonly known as:  Oxy IR/ROXICODONE     TAKE these medications   amLODipine 10 MG tablet Commonly known as:  NORVASC Take 10 mg by mouth daily.   ketorolac 10 MG tablet Commonly known as:  TORADOL Take 1 tablet (10 mg total) by mouth every 6 (six) hours as needed.   labetalol 100 MG tablet Commonly known as:  NORMODYNE Take 1 tablet (100 mg total) by mouth 2 (two) times daily.   metroNIDAZOLE 500 MG tablet Commonly known as:  FLAGYL Take 1 tablet (500 mg total) by mouth 2 (two) times daily.   oxyCODONE-acetaminophen 5-325 MG tablet Commonly known as:  PERCOCET/ROXICET Take 1 tablet by mouth every 6 (six) hours as needed for severe pain (for breakthrough pain).   sulfamethoxazole-trimethoprim 800-160 MG tablet Commonly known as:  BACTRIM DS,SEPTRA DS Take 1 tablet by mouth 2 (two) times daily.        [place] Verlyn Dannenberg, CNM 11/17/2016  4:12 AM

## 2016-11-17 NOTE — MAU Note (Addendum)
Had repeat C/S Monday 04/23/2016. Had problem with incision and had to be reopened recently.  Odor from incision that is foul. Pain is worse. Does not see anything leaking from incision. Still has on honeycomb dsg

## 2016-11-17 NOTE — Discharge Instructions (Signed)
Infeccin en las heridas (Wound Infection) La infeccin ocurre cuando algn tipo de germen se desarrolla en una herida. Los grmenes que causan las infecciones en las heridas, generalmente son las bacterias. Tambin pueden ocurrir otro tipo de infecciones. En algunos casos, la infeccin hace que la herida se abra. Las infecciones en las heridas necesitan tratamiento. Si una infeccin en una herida no es tratada, puede haber complicaciones. Esto puede incluir una infeccin en el torrente sanguneo (sepsis) o en un hueso (osteomielitis). CAUSAS Esta afeccin es causada por grmenes que se desarrollan en la herida. FACTORES DE RIESGO Los siguientes factores pueden hacer que usted sea propenso a sufrir esta afeccin:  Debilitamiento del sistema de defensa del cuerpo (sistema inmunitario).  Tener diabetes.  Tomar corticoides durante mucho tiempo (uso crnico).  Fumar.  Edad avanzada.  Tener sobrepeso. SNTOMAS Los sntomas de esta afeccin incluyen lo siguiente:  Tiene ms enrojecimiento, hinchazn o Art therapistdolor en el lugar de la herida.  Tiene ms sangre, pus o lquido en el lugar de la herida.  Advierte un olor ftido que proviene de la herida o de la venda (vendaje).  Tiene fiebre.  Se siente cansado o fatigado. DIAGNSTICO Esta afeccin se diagnostica mediante la historia clnica y un examen fsico. Tambin pueden hacerle un anlisis de [place]angre. TRATAMIENTO Esta afeccin se trata con antibiticos. La infeccin puede mejorar entre 24 y 48 horas despus de [place] Fargocomenzar los antibiticos. El enrojecimiento alrededor de la herida debe dejar de extenderse y el dolor en la debe comenzar a ceder. INSTRUCCIONES PARA EL CUIDADO EN EL HOGAR Medicamentos   Tome o aplquese los medicamentos de venta libre y recetados solamente como se lo haya indicado el mdico.  Si le recetaron antibiticos, tmelos o aplqueselos como se lo haya indicado el mdico. No deje de usar el antibitico aunque la afeccin  mejore. Cuidados de la herida   Limpie la herida todos los das o como se lo haya indicado el mdico.  Lave la herida con agua y [place]jabn suave.  Enjuguela con agua para quitar todo el [place]jabn.  Seque dando palmaditas con una toalla limpia y seca. No la frote.  Siga las indicaciones del mdico acerca del cuidado de la herida. Haga lo siguiente:  BorgWarnerLvese las manos con agua y [place]jabn antes de cambiar el vendaje. Use desinfectante para manos si no dispone de [place] y [place]jabn.  Cambie el vendaje como se lo haya indicado el mdico.  No retire los puntos (suturas), el North Bendadhesivo para la piel o las tiras Arabadhesivas, si corresponde. Es posible que estos deban quedar puestos en la piel durante 2semanas o ms tiempo. Si los bordes de las tiras [address]adhesivas empiezan a despegarse y Scientific laboratory technicianenroscarse, puede recortar los que estn sueltos. No retire las tiras Agilent Technologiesadhesivas por completo a menos que el mdico se lo indique. Algunas heridas se dejan abiertas para que se curen por s solas.  Controle la herida CarMaxtodos los das para detectar signos de infeccin. Est atento a lo siguiente:  Aumento del enrojecimiento, la hinchazn o Chief Technology Officerel dolor.  Ms lquido [place] Janskyo sangre.  Calor.  Pus o mal olor. Instrucciones generales   Mantenga el vendaje seco hasta que el mdico le diga que se lo puede quitar.  No tome baos de inmersin, no nade, no use el jacuzzi ni haga ninguna actividad en la que la herida quede debajo del agua hasta que el mdico lo autorice.  Cuando est sentado o acostado, eleve la zona de la lesin por encima del nivel del corazn.  No se rasque ni se toque la herida.  Concurra a todas las visitas de control como se lo haya indicado el mdico. Esto es importante. SOLICITE ATENCIN MDICA SI:  El dolor no se alivia con los United Parcelmedicamentos.  Tiene ms enrojecimiento, hinchazn o dolor alrededor de la herida.  Observa ms lquido o sangre que salen de la herida.  La herida est caliente al tacto.  Observa pus que  proviene de la herida.  Percibe que sigue saliendo mal olor de la herida o del vendaje.  La herida estaba cerrada y se abre. SOLICITE ATENCIN MDICA DE INMEDIATO SI:  Tiene una lnea roja que sale de la herida.  Tiene fiebre. Esta informacin no tiene Theme park managercomo fin reemplazar el consejo del mdico. Asegrese de hacerle al mdico cualquier pregunta que tenga. Document Released: 08/21/2005 Document Revised: 03/04/2016 Document Reviewed: 05/01/2015 Elsevier Interactive Patient Education  2017 [employer]vier Inc.

## 2016-11-21 ENCOUNTER — Ambulatory Visit (INDEPENDENT_AMBULATORY_CARE_PROVIDER_SITE_OTHER): Payer: Self-pay | Admitting: Family Medicine

## 2016-11-21 ENCOUNTER — Other Ambulatory Visit: Payer: Self-pay | Admitting: Family Medicine

## 2016-11-21 VITALS — BP 134/61 | HR 90 | Ht 63.0 in | Wt 179.0 lb

## 2016-11-21 DIAGNOSIS — R634 Abnormal weight loss: Secondary | ICD-10-CM | POA: Insufficient documentation

## 2016-11-21 DIAGNOSIS — L0390 Cellulitis, unspecified: Secondary | ICD-10-CM

## 2016-11-21 LAB — COMPREHENSIVE METABOLIC PANEL
ALK PHOS: 98 U/L (ref 33–115)
ALT: 43 U/L — AB (ref 6–29)
AST: 50 U/L — AB (ref 10–30)
Albumin: 4.1 g/dL (ref 3.6–5.1)
BUN: 11 mg/dL (ref 7–25)
CALCIUM: 11 mg/dL — AB (ref 8.6–10.2)
CO2: 23 mmol/L (ref 20–31)
Chloride: 102 mmol/L (ref 98–110)
Creat: 0.35 mg/dL — ABNORMAL LOW (ref 0.50–1.10)
GLUCOSE: 89 mg/dL (ref 65–99)
POTASSIUM: 5.3 mmol/L (ref 3.5–5.3)
Sodium: 137 mmol/L (ref 135–146)
Total Bilirubin: 0.7 mg/dL (ref 0.2–1.2)
Total Protein: 7.9 g/dL (ref 6.1–8.1)

## 2016-11-21 LAB — TSH: TSH: <0.01 mIU/L — ABNORMAL LOW

## 2016-11-21 MED ORDER — CEPHALEXIN 500 MG PO CAPS: 500.0000 mg | ORAL_CAPSULE | Freq: Three times a day (TID) | ORAL | 0 refills | Status: DC

## 2016-11-21 NOTE — Progress Notes (Signed)
   Subjective:    Patient ID: Maria Daniel is a 35 y.o. female presenting with Wound Check  on 11/21/2016  HPI: S/p excision of suspected wound endometriosis. There is no endometriosis noted on excisional samples. Wound with some minor break down. Seen in MAU and placed on Septra and cannot keep it down. Additionally has lost 100 lbs without intention and has emesis constantly. She does not want anti-nausea medicine. Has appointment with PCP today at 4 pm.  Review of Systems  Constitutional: Positive for chills. Negative for fever.  HENT: Negative for congestion, ear discharge and sore throat.   Eyes: Negative for discharge and redness.  Respiratory: Negative for cough, shortness of breath and wheezing.   Cardiovascular: Negative for chest pain and leg swelling.  Gastrointestinal: Negative for abdominal pain, constipation, diarrhea, nausea and vomiting.  Genitourinary: Negative for dysuria and hematuria.  Musculoskeletal: Negative for back pain.  Skin: Negative for rash.      Objective:    BP 134/61   Pulse 90   Ht 5\' 3"  (1.6 m)   Wt 179 lb (81.2 kg)   BMI 31.71 kg/m  Physical Exam  Constitutional: She is oriented to person, place, and time. She appears well-developed and well-nourished. No distress.  HENT:  Head: Normocephalic and atraumatic.  Eyes: No scleral icterus.  Neck: Neck supple.  Cardiovascular: Normal rate.   Pulmonary/Chest: Effort normal.  Abdominal: Soft.  Neurological: She is alert and oriented to person, place, and time.  Skin: Skin is warm.  Open area, without warmth or erythema.  Psychiatric: She has a normal mood and affect.        Assessment & Plan:   Problem List Items Addressed This Visit      Unprioritized   Weight loss    Check CMP and TSH today--to see PCP today      Relevant Orders   Comprehensive metabolic panel   TSH    Other Visit Diagnoses    Wound cellulitis    -  Primary   doubt cellulitis, suspect poor wound  healing due to malnutrition. Changed Abx in case--clean area with H2O2. Improve nutrition.   Relevant Medications   cephALEXin (KEFLEX) 500 MG capsule      Total face-to-face time with patient: 15 minutes. Over 50% of encounter was spent on counseling and coordination of care.  Reva Boresanya S Pratt 11/21/2016 2:15 PM

## 2016-11-21 NOTE — Progress Notes (Signed)
Patient states she is unable to take the antibiotics, they cause her to vomit every time.

## 2016-11-21 NOTE — Patient Instructions (Signed)
Cmo cambiar su vendaje (How to Change Your Dressing) Un vendaje es un material que se coloca en las heridas y Dealsobre ellas. El vendaje ayuda a la cicatrizacin de las heridas al protegerlas de lo siguiente:  Bacterias.  Lesiones peores.  Demasiada sequedad o humedad. RIESGOS Y COMPLICACIONES La cinta adhesiva que se [place] con un vendaje puede producir llagas o irritacin en la piel, o causar una erupcin cutnea. Estos son los sntomas ms frecuentes. Sin embargo, pueden ocurrir problemas ms graves, por ejemplo:  Hemorragia.  Infeccin. CMO CAMBIAR SU VENDAJE Cmo prepararse para cambiar un vendaje   Tome una ducha antes de Development worker, international aidhacer el primer cambio del da. Si el mdico recomienda que la herida no se moje y el vendaje no es impermeable, es posible que deba envolverlo hermticamente con plstico para protegerlo.  Si es necesario, tome un analgsico como el mdico le haya indicado 30minutos antes del cambio del vendaje.  Elija un lugar limpio para cambiar sus vendas. Usted necesitar lo siguiente:  Neomia DearUna bolsa plstica de residuos Congoabierta y lista para usar.  Desinfectante de manos.  Limpiador de heridas o solucin de agua salada (salina) como se lo haya indicado el mdico.  Material de vendaje o vendas nuevas. Abra el paquete del vendaje, pero no lo saque del interior. Es posible que tambin necesite DIRECTVtener los siguientes suministros en el lugar limpio donde se cambie el vendaje:  Una caja de guantes de vinilo.  Quest DiagnosticsCinta adhesiva.  Protector para la piel. Esto puede ser un pao, una lmina o un aerosol.  Tijeras limpias o libres de grmenes (estril).  Un aplicador con punta de algodn. Cmo quitar el vendaje usado   Lvese las manos con agua y [place]jabn. Squelas con una toalla limpia. Use un desinfectante para manos si no dispone de [place] y [place]jabn.  Si [place] guantes, pngaselos antes de quitar el vendaje.  Retire suavemente el QUALCOMMadhesivo o la cinta tirando en la direccin del  crecimiento del vello. Toque nicamente los bordes exteriores del vendaje.  Retire el vendaje. Si el vendaje se adhiere a la piel, mjelo con una solucin salina estril. Esto ayuda a retirarlo ms fcilmente.  Retire todas las gasas o envolturas de la herida.  Tenga lista la bolsa de residuos y Golden West Financialdeseche los suministros del vendaje anterior.  Qutese los guantes. Para hacerlo, tome cada guante por el borde con la otra mano y delo vuelta. Deseche los guantes en la basura de inmediato.  Lvese las manos con agua y [place]jabn. Squelas con una toalla limpia. Use un desinfectante para manos si no dispone de [place] y [place]jabn. Limpieza de la herida   Siga las indicaciones del mdico en lo que respecta a la limpieza de la herida. Esto puede incluir usar una solucin salina o un limpiador de heridas recomendado.  No use cremas, aerosoles o lquidos medicinales o antispticos de venta sin receta, ni tampoco vendajes, a menos que se lo indique el mdico.  Use una compresa de gasa limpia para limpiar a fondo la zona con la solucin salina o el limpiador de heridas que le recomiende el mdico.  Arroje la compresa de gasa en la bolsa de residuos.  Lvese las manos con agua y [place]n. Squelas con una toalla limpia. Use un desinfectante para manos si no dispone de [place] y [place]jabn. Colocacin del vendaje   Si el mdico le recomienda un protector para la piel, aplqueselo en la piel que rodea la herida.  Cubra la herida con el apsito recomendado, Aon Corporationcomo una  gasa o un vendaje antiadherente. Asegrese de tocar Medco Health Solutionsnicamente los bordes exteriores del vendaje. No toque la parte interior del vendaje.  Sujete el vendaje para que todos los Nurse, children'slados permanezcan en su lugar. Puede hacer esto con el QUALCOMMadhesivo mdico adherido, la almohadilla de gasa o la cinta St. Henryadhesiva. Si [place] cinta adhesiva, no envuelva la cinta alrededor de su brazo o pierna.  Qutese los guantes. Colquelos en la bolsa de residuos junto con el vendaje usado. Cierre  la bolsa y deschela.  Lvese las manos con agua y [place]jabn. Squelas con una toalla limpia. Use un desinfectante para manos si no dispone de [place] y [place]jabn. SOLICITE AYUDA SI:  Siente un dolor nuevo.  Tiene irritacin, una erupcin o picazn alrededor de la herida o del vendaje.  Cambiar el Cabin crewvendaje le causa dolor.  Cambiar el vendaje le produce mucho sangrado. SOLICITE AYUDA DE INMEDIATO SI:  Siente mucho dolor.  Tiene signos de infeccin, como los siguientes:  Aumento del enrojecimiento, la hinchazn o Chief Technology Officerel dolor.  Ms lquido [place] Janskyo sangre.  Calor.  Pus o mal olor.  Lneas rojas que salen de la herida.  Grant RutsFiebre. Esta informacin no tiene Theme park managercomo fin reemplazar el consejo del mdico. Asegrese de hacerle al mdico cualquier pregunta que tenga. Document Released: 12/14/2010 Document Revised: 03/04/2016 Document Reviewed: 08/17/2015 Elsevier Interactive Patient Education  2017 ArvinMeritorElsevier Inc.

## 2016-11-21 NOTE — Assessment & Plan Note (Signed)
Check CMP and TSH today--to see PCP today

## 2016-11-22 ENCOUNTER — Telehealth: Payer: Self-pay | Admitting: *Deleted

## 2016-11-22 LAB — T4, FREE: Free T4: 4.8 ng/dL — ABNORMAL HIGH (ref 0.8–1.8)

## 2016-11-22 LAB — T3, FREE

## 2016-11-22 NOTE — Telephone Encounter (Signed)
-----   Message from Reva Boresanya S Pratt, MD sent at 11/22/2016  9:33 AM EST ----- Please add Free T3 and Free T4 to her labs--call patient with interpreter and let her know--it appears her thyroid is overactive...which may explain her weight loss

## 2016-11-22 NOTE — Telephone Encounter (Signed)
Spoke with ThrallBetsy and labs have been added per Dr Tawni LevyPratt's request. Attempted to call patient using Spanish interpreter. There was no answer, voice mail left stating I am calling regarding some non urgent results and will call again later.

## 2016-11-28 ENCOUNTER — Telehealth: Payer: Self-pay | Admitting: *Deleted

## 2016-11-28 ENCOUNTER — Ambulatory Visit: Payer: Self-pay | Admitting: Obstetrics and Gynecology

## 2016-11-28 NOTE — Telephone Encounter (Signed)
Per Dr Alysia PennaErvin, tried to contact this patient who missed her appointment today. There was no answer and a voice mail was left asking her to contact us to reschedule. Per Dr Alysia PennaErvin, she needs to seen him next week, ok to overbook. Spanish interpreter # [phone] used for call.

## 2016-11-29 NOTE — Telephone Encounter (Signed)
Two encounters- combined from [place]Janice, [place].  Per Dr Alysia PennaErvin, tried to contact this patient who missed her appointment today. There was no answer and a voice mail was left asking her to contact us to reschedule. Per Dr Alysia PennaErvin, she needs to seen him next week, ok to overbook. Spanish interpreter # [phone] used for call.  [facility] Interpreter # [phone],  LM on both home and mobile # for pt to return call to reschedule appt.  Letter sent.

## 2016-12-02 ENCOUNTER — Ambulatory Visit: Payer: Self-pay | Admitting: Obstetrics and Gynecology

## 2016-12-02 ENCOUNTER — Encounter: Payer: Self-pay | Admitting: Obstetrics and Gynecology

## 2016-12-02 ENCOUNTER — Ambulatory Visit: Payer: Self-pay | Attending: Internal Medicine | Admitting: *Deleted

## 2016-12-02 VITALS — BP 129/82 | HR 89 | Resp 20

## 2016-12-02 VITALS — BP 124/59 | HR 84 | Temp 98.2°F | Wt 185.9 lb

## 2016-12-02 DIAGNOSIS — R634 Abnormal weight loss: Secondary | ICD-10-CM

## 2016-12-02 DIAGNOSIS — I10 Essential (primary) hypertension: Secondary | ICD-10-CM | POA: Insufficient documentation

## 2016-12-02 DIAGNOSIS — E079 Disorder of thyroid, unspecified: Secondary | ICD-10-CM

## 2016-12-02 DIAGNOSIS — L7682 Other postprocedural complications of skin and subcutaneous tissue: Secondary | ICD-10-CM

## 2016-12-02 NOTE — Progress Notes (Signed)
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 Pt reports she si doing well from a surgerical standpoint. She was seen with possible wound infection vs cellutilitis about 1 week after surgery. She has completed her antibiotics and the wound is healing well. Today she reports only a little incisional pain, much improved prior to surgery. She has seen her PCP about her HTN and GDM. She does continue to have several GI complaints. Diarrhea, some N/V and wt loss to name a few. She had thyroid studies at last visit which were abnormal. She has however gained about 4 # since last visit.    PE AF VSS Lungs clear Heart RRR Abd soft + Bs. incisions site healing well, no evidence of infection, small area of secondary healing noted, retraction of underlying SQ tissue from surgery noted  A/P  Post op visit Abnormal thyroid studies  Pt is recovering well from surgery. Pt instructed to continue with wound care twice a day until completely healed. Discussed with pt the skin retraction will hopefully resolved in the next 8-12 weeks. But informed that there may always be some retraction.  Pt to see PCP today for f/u of HTN. Instructed to discuss thyroid studies and treatment as they feel is indicated. She will f/u with us in 4 weeks Interrupter was used during today's visit.

## 2016-12-02 NOTE — Progress Notes (Signed)
Patient has has diarrhea and headache x1 week. Afebrile.

## 2016-12-02 NOTE — Progress Notes (Signed)
Interpreter Aura [phone] assisted with Nurse visit.  Pt here for BP check: Pt states she feels dizzy at times After taking BP. Wanted to know if adjustment could be made to medications.  Had chest pain,4 days ago 2 nights ago felt HR beating very fast and strong off her blouse, she was able to hear HR. She denies any physical activity prior to this incident Not constant but has had blurred vision and very bad headache Swelling in her feet  Bitter taste in mouth , no taste in the foods she eats or in water. Feels weak and lost a lot of weight  She has not taken prenatal vitamins in 3 months. Based on symptoms, pt.  provider Maria SenateMandesia Hairston, NP encouraged patient to f/u with Urgent Care or ER today. and  to f/u  with OB/GYN for TSH and thyroid level concerns.

## 2016-12-03 ENCOUNTER — Telehealth: Payer: Self-pay

## 2016-12-03 NOTE — Telephone Encounter (Signed)
Pt called back to review results, please f/up °

## 2016-12-03 NOTE — Telephone Encounter (Signed)
Patient was advised to go to the urgent care or ED for symptoms. Considering her recent history of surgery and worsening fatigue I would advise her to go to the  ED. Calcium levels were normal at last visit.

## 2016-12-03 NOTE — Telephone Encounter (Signed)
CMA call to go over the lab results did not answer left a VM to call us back. 

## 2016-12-03 NOTE — Telephone Encounter (Signed)
-----   Message from Lizbeth BarkMandesia R Hairston, OregonFNP sent at 11/21/2016  6:49 PM EST ----- -HgbA1c is 5.5 which is normal. A HgbA1c of 6.5 or higher indicates diabetes. -Creatinine levels are low. Increase the amount of protein in your diet. Good sources of protein include meats, beans, eggs, and cheese.  -Increase your water intake.

## 2016-12-03 NOTE — Telephone Encounter (Signed)
Patient verify DOB  Patient was aware and understood of her results  Patient did had questions to the doctor.

## 2016-12-04 ENCOUNTER — Encounter: Payer: Self-pay | Admitting: Family Medicine

## 2016-12-04 ENCOUNTER — Ambulatory Visit: Payer: Self-pay | Attending: Family Medicine | Admitting: Family Medicine

## 2016-12-04 VITALS — BP 90/58 | HR 96 | Temp 98.1°F | Resp 18 | Ht 63.0 in | Wt 184.0 lb

## 2016-12-04 DIAGNOSIS — Z79899 Other long term (current) drug therapy: Secondary | ICD-10-CM | POA: Insufficient documentation

## 2016-12-04 DIAGNOSIS — I10 Essential (primary) hypertension: Secondary | ICD-10-CM | POA: Insufficient documentation

## 2016-12-04 DIAGNOSIS — E0590 Thyrotoxicosis, unspecified without thyrotoxic crisis or storm: Secondary | ICD-10-CM | POA: Insufficient documentation

## 2016-12-04 DIAGNOSIS — O10919 Unspecified pre-existing hypertension complicating pregnancy, unspecified trimester: Secondary | ICD-10-CM | POA: Insufficient documentation

## 2016-12-04 MED ORDER — PROPYLTHIOURACIL 50 MG PO TABS: 50.0000 mg | ORAL_TABLET | Freq: Three times a day (TID) | ORAL | 2 refills | Status: DC

## 2016-12-04 MED ORDER — LABETALOL HCL 100 MG PO TABS: 100.0000 mg | ORAL_TABLET | Freq: Every day | ORAL | 2 refills | Status: DC

## 2016-12-04 NOTE — Patient Instructions (Signed)
Propylthiouracil, PTU tablets Qu es este medicamento? El PROPILTIOURACILO reduce la cantidad de hormona tiroidea que la glndula tiroides produce. Este medicamento se [place] Islandsutiliza para el tratamiento del hipertiroidismo (cuando la glndula tiroides produce un exceso de hormona). Se utiliza tambin antes de [place]inauna ciruga del tiroides o un tratamiento con yodo radiactivo. Este medicamento puede ser utilizado para otros usos; si tiene alguna pregunta consulte con su proveedor de atencin mdica o con su farmacutico. Qu le debo informar a mi profesional de la salud antes de tomar este medicamento? Necesita saber si usted presenta alguno de los siguientes problemas o situaciones: -trastornos sanguneos -enfermedad heptica -una reaccin alrgica o inusual al propiltiouracilo, a otros medicamentos, alimentos, colorantes o conservantes -si est embarazada o buscando quedar embarazada -si est amamantando a un beb Cmo debo utilizar este medicamento? Tome este medicamento por va oral. Siga las instrucciones de la etiqueta del Northmoormedicamento. Tome sus dosis a intervalos regulares. No tome su medicamento con una frecuencia mayor a la indicada. No deje de tomarlo excepto si as lo indica su mdico. Recibir un folleto de informacin para el producto con cada receta y relleno. Asegrese de leer este folleto cada vez cuidadosamente. Este folleto puede cambiar con frecuencia. Hable con su pediatra para informarse acerca del uso de este medicamento en nios. Aunque este medicamento ha sido recetado a nios tan menores como de 6 aos de edad para condiciones selectivas, las precauciones se aplican. Sobredosis: Pngase en contacto inmediatamente con un centro toxicolgico o una sala de urgencia si usted cree que haya tomado demasiado medicamento. ATENCIN: Reynolds AmericanEste medicamento es solo para usted. No comparta este medicamento con nadie. Qu sucede si me olvido de una dosis? Si olvida una dosis, tmela lo antes posible.  Si es casi la hora de la prxima dosis, tome slo esa dosis. No tome dosis adicionales o dobles. Qu puede interactuar con este medicamento? -amiodarona -digoxina -yoduro de potasio -propranolol -yoduro de sodio -teofilina -hormonas tiroideas como levotiroxina -warfarina Puede ser que esta lista no menciona todas las posibles interacciones. Informe a su profesional de Beazer Homesla salud de Ingram Micro Inctodos los productos a base de hierbas, medicamentos de [place] libre o suplementos nutritivos que est tomando. Si usted fuma, consume bebidas alcohlicas o si utiliza drogas ilegales, indqueselo tambin a su profesional de Beazer Homesla salud. Algunas sustancias pueden interactuar con su medicamento. A qu debo estar atento al usar PPL Corporationeste medicamento? Visite a su mdico o a su profesional de la salud para chequear su evolucin peridicamente. Puede transcurrir tiempo para que mejore su problema. Necesitar realizarse anlisis para chequear sus conteos sanguneos y asegurarse de que su cuerpo produce la cantidad suficiente de hormona tiroidea. Si va a someterse a una operacin u operacin dental, informe a su mdico, dentista o a su profesional de la salud que est Consolidated Edisontomando este medicamento. Este medicamento puede reducir sus conteos sanguneos y reducir su resistencia a las infecciones. Informe a su mdico o a su profesional de la salud si presenta sntomas a una infeccin. No se trate usted mismo si tiene fiebre o dolor de Advertising copywritergarganta. No reciba ninguna vacuna sin consentimiento. Aunque este medicamento a veces se [place] durante un embarazo, efectos secundarios son posibles a un beb sin nacer. Necesita supervisin atenta. Este medicamento se pasa por la Colgate Palmoliveleche materna. Existe la posibilidad de efectos secundarios un beb lactante. Para ms informacin hable con su mdico o su farmacutico. Este medicamento puede provocar problemas hepticos graves. Si tiene sntomas tipo gripe, orina oscura, heces claras, dolor en la regin abdominal  superior derecha, o color amarillento de los ojos o la piel, comunquese con su mdico o su profesional de Beazer Homes. Qu efectos secundarios puedo tener al [employer] Scientific este medicamento? Efectos secundarios que debe informar a su mdico o a Producer, television/film/video de la salud tan pronto como sea posible: -Therapist, art como erupcin cutnea, picazn o urticarias, hinchazn de la cara, labios o lengua -bocio (agrandamiento de la tiroides que causa hinchazn de la garganta) -conteos sanguneos bajos - este medicamento puede reducir la cantidad de glbulos blancos. Su riesgo de infeccin y sangrado puede ser mayor. -llagas en la boca -enrojecimiento, formacin de ampollas, descamacin o distensin de la piel, inclusive dentro de la boca -signos de infeccin - fiebre o escalofros, tos, dolor de garganta -signos y sntomas de lesin al hgado como orina amarillo oscuro o [place]; sensacin general de estar enfermo o sntomas gripales; heces claras; prdida de apetito; nuseas; dolor en la regin abdominal superior derecha; cansancio o debilidad inusual; color amarillento de los ojos o la piel -hinchazn de los tobillos o piernas -sangrado o magulladuras inusuales -aumento repentino o inusual de peso Efectos secundarios que, por lo general, no requieren Psychologist, prison and probation services (debe informarlos a su mdico o a su profesional de la salud si persisten o si son molestos): -somnolencia -prdida en el sentido de gusto -dolor de cabeza -dolor en las articulaciones -dolores musculares -nuseas -entumecimiento u hormigueo de las manos o pies -Programme researcher, broadcasting/film/video Puede ser que esta lista no menciona todos los posibles efectos secundarios. Comunquese a su mdico por asesoramiento mdico Hewlett[employer]ard. Usted puede informar los efectos secundarios a la FDA por telfono al 1-800-FDA-1088. Dnde debo guardar mi medicina? Mantngala fuera del alcance de los nios. Gurdela a Sanmina-SCI, entre 15 y  30 grados C (65 y 90 grados F). Protjala del calor y de la humedad. Mantenga el envase bien cerrado. Deseche todo el medicamento que no haya utilizado, despus de la fecha de vencimiento. ATENCIN: Este folleto es un resumen. Puede ser que no cubra toda la posible informacin. Si usted tiene preguntas acerca de esta medicina, consulte con su mdico, su farmacutico o su profesional de Radiographer, therapeutic.  2017 Elsevier/Gold Standard (2015-01-03 00:00:00)  Hipertiroidismo (Hyperthyroidism) El hipertiroidismo ocurre cuando la tiroides est demasiado activa (hiperactiva). La tiroides es una glndula grande ubicada en el cuello que ayuda a Chief Operating Officer la forma en que el organismo [place] los alimentos (metabolismo). Cuando la tiroides est hiperactiva, produce una cantidad excesiva de una hormona llamada tiroxina. CAUSAS Las causas del hipertiroidismo pueden incluir lo siguiente:  Enfermedad de [place]. Se produce cuando el sistema inmunitario ataca la tiroides. sta es la causa ms frecuente.  Inflamacin de la tiroides.  Tumor en la tiroides o Chief of Staff.  Uso excesivo de medicamentos para la tiroides, entre ellos:  Suplementos recetados para la tiroides.  Suplementos a base de hierbas que imitan a las hormonas tiroideas.  Bultos slidos o llenos de lquido en la tiroides (ndulos tiroideos).  Ingesta excesiva de yodo. FACTORES DE RIESGO  Ser mujer.  Tener antecedentes familiares de enfermedades tiroideas. SIGNOS Y SNTOMAS Los signos y los sntomas de hipertiroidismo pueden ser los siguientes:  Nerviosismo.  Incapacidad para Patent examiner.  Prdida de peso sin causa aparente.  Diarrea.  Cambios en la textura del pelo o la piel.  Falta de latidos cardacos o ms latidos cardacos.  Frecuencia cardaca acelerada.  Ausencia de la [employer].  Temblores en las manos.  Fatiga.  Agitacin.  Aumento del apetito.  Problemas para dormir.  Aumento del tamao de la  tiroides o ndulos. DIAGNSTICO El diagnstico de hipertiroidismo puede incluir lo siguiente:  Examen fsico e historia clnica.  Anlisis de [place].  Ecografas. TRATAMIENTO El tratamiento puede incluir lo siguiente:  Medicamentos para Scientist, physiological tiroides.  Ciruga para extirpar la tiroides.  Radioterapia. INSTRUCCIONES PARA EL CUIDADO EN EL HOGAR  Tome los medicamentos solamente como se lo haya indicado el mdico.  No consuma ningn producto que contenga tabaco, lo que incluye cigarrillos, tabaco de Theatre manager o Administrator, Civil Service. Si necesita ayuda para dejar de fumar, consulte al mdico.  No reanude la actividad fsica ni los ejercicios hasta que el mdico lo autorice.  Concurra a todas las visitas de control, segn le indique su mdico. Esto es importante. SOLICITE ATENCIN MDICA SI:  Los sntomas no mejoran con Scientist, research (medical).  Tiene fiebre.  Est tomando medicamentos sustitutivos de la tiroides y:  Est deprimido.  Se siente mental y fsicamente lento.  Aumenta de [place]. SOLICITE ATENCIN MDICA DE INMEDIATO SI:  Disminuy su estado de alerta o hay cambios en la conciencia.  Siente dolor abdominal.  Siente mareos.  Tiene una frecuencia cardaca acelerada.  Tiene latidos cardacos irregulares. Esta informacin no tiene Theme park manager el consejo del mdico. Asegrese de hacerle al mdico cualquier pregunta que tenga. Document Released: 11/11/2005 Document Revised: 12/02/2014 Document Reviewed: 03/29/2014 Elsevier Interactive Patient Education  2017 ArvinMeritor.

## 2016-12-04 NOTE — Progress Notes (Signed)
  [id number] Havier    Subjective:  Patient ID: Maria Daniel, female    DOB: 08/21/1981  Age: 36 y.o. MRN: [id number]  CC: Establish Care  HPI Maria Lyonsrma Daniel presents for hyperthyroidism. She has history of recent surgery and c/o weight loss. Lab work ordered from specialist showed hyperthyroidism. She reports losing weight, weakness, and palpitations.   Outpatient Medications Prior to Visit  Medication Sig Dispense Refill  . labetalol (NORMODYNE) 100 MG tablet Take 1 tablet (100 mg total) by mouth 2 (two) times daily. 30 tablet 2   Facility-Administered Medications Prior to Visit  Medication Dose Route Frequency Provider Last Rate Last Dose  . influenza  inactive virus vaccine (FLUZONE/FLUARIX) injection 0.5 mL  0.5 mL Intramuscular Once Adam PhenixJames G Arnold, MD       ROS Review of Systems  Constitutional: Positive for fatigue and unexpected weight change.  Eyes: Negative.   Respiratory: Negative.   Cardiovascular: Positive for palpitations.  Gastrointestinal: Negative.    Objective:  BP (!) 90/58 (BP Location: Left Arm, Patient Position: Sitting, Cuff Size: Large)   Pulse 96   Temp 98.1 F (36.7 C) (Oral)   Resp 18   Ht 5\' 3"  (1.6 m)   Wt 184 lb (83.5 kg)   SpO2 98%   BMI 32.59 kg/m   BP/Weight 12/04/2016 12/02/2016 12/02/2016  Systolic BP 90 129 124  Diastolic BP 58 82 59  Wt. (Lbs) 184 - 185.9  BMI 32.59 - 32.93   Physical Exam  Eyes: Pupils are equal, round, and reactive to light.  Neck: Carotid bruit is not present. No thyroid mass and no thyromegaly present.  Cardiovascular: Normal rate, regular rhythm, normal heart sounds and intact distal pulses.   Pulmonary/Chest: Effort normal and breath sounds normal.  Abdominal: Soft. Bowel sounds are normal.   Assessment & Plan:   Problem List Items Addressed This Visit      Cardiovascular and Mediastinum   Hypertension   Relevant Medications   labetalol (NORMODYNE) 100 MG tablet    Other Visit Diagnoses      Hyperthyroidism    -  Primary   Relevant Medications   labetalol (NORMODYNE) 100 MG tablet   propylthiouracil (PTU) 50 MG tablet   Other Relevant Orders   NM Thyroid Scan/Uptake 24 Hr     Meds ordered this encounter  Medications  . labetalol (NORMODYNE) 100 MG tablet    Sig: Take 1 tablet (100 mg total) by mouth daily.    Dispense:  30 tablet    Refill:  2    Order Specific Question:   Supervising Provider    Answer:   Quentin AngstJEGEDE, OLUGBEMIGA E [id number]]  . propylthiouracil (PTU) 50 MG tablet    Sig: Take 1 tablet (50 mg total) by mouth 3 (three) times daily.    Dispense:  30 tablet    Refill:  2    Order Specific Question:   Supervising Provider    Answer:   Quentin AngstJEGEDE, OLUGBEMIGA E [id number]]    Follow-up: Return in about 6 weeks (around 01/15/2017) for  Hyperthyroidism.   Maria BarkMandesia R Rayon Mcchristian FNP

## 2016-12-04 NOTE — Progress Notes (Signed)
Patient is here for establish care   Patient feels fatigue  Patient complains that this morning had some blurry vision , she feels dizziness when sitting/standing   Patient stated that she had slip in the last 30 days but no hospitalized for it   BP 90/58 P96

## 2016-12-04 NOTE — Telephone Encounter (Signed)
CMA call no answer but left a VM stating the advise that her PCP told her to go to the ED if her fatigue still worsening.

## 2016-12-05 ENCOUNTER — Telehealth: Payer: Self-pay

## 2016-12-05 NOTE — Telephone Encounter (Signed)
-----   Message from Lizbeth BarkMandesia R Hairston, FNP sent at 12/05/2016  3:13 PM EST ----- Regarding: Patient follow up Please inform patient that I consulted with pharmacist and that she can breastfeed while on PTU as long as her dosage is less than 300 mg. She is currently on 50 mg. Also inform her that the RDU (radioactive uptake test) that would be used to evaluate the cause of her hyperthyroidism requires radiation. So she will not be able to breastfeed.

## 2016-12-05 NOTE — Telephone Encounter (Signed)
CMA call to inform that she can take her meds while breastfeeding if less than 300 mg and she is currently on 50 mg but she can not have her thyroid scan until she stop breat feeding  Also stated that if she have any question just feel free to call us back

## 2016-12-12 ENCOUNTER — Ambulatory Visit: Payer: Self-pay | Admitting: Family Medicine

## 2017-01-09 ENCOUNTER — Ambulatory Visit: Payer: Self-pay | Admitting: Obstetrics and Gynecology

## 2017-01-22 ENCOUNTER — Ambulatory Visit: Payer: Self-pay | Admitting: Obstetrics and Gynecology

## 2017-01-22 ENCOUNTER — Encounter: Payer: Self-pay | Admitting: Obstetrics and Gynecology

## 2017-01-22 VITALS — BP 133/84 | HR 84 | Wt 181.4 lb

## 2017-01-22 DIAGNOSIS — L7682 Other postprocedural complications of skin and subcutaneous tissue: Secondary | ICD-10-CM

## 2017-01-22 NOTE — Progress Notes (Signed)
Pt here for post op follow up from excision of necrotic tissue. She has now completely healed. She denies any pain today. She is back to normal ADL's without problems  PE AF VSS Lungs clear Heart RRR  Abd soft + BS   Incision sites well healed, scaring noted from healing  A/P Post op check        Incisional pain  Pt asked about future pregnancies. She has had 3 c section and last one was complicated by wound infection plus additional surgery. Pt advised against future pregnancies. Pt currently not sexual active and declines contraception. F/U PRN

## 2017-02-07 ENCOUNTER — Other Ambulatory Visit: Payer: Self-pay | Admitting: Family Medicine

## 2017-02-07 DIAGNOSIS — E0590 Thyrotoxicosis, unspecified without thyrotoxic crisis or storm: Secondary | ICD-10-CM

## 2017-02-07 DIAGNOSIS — I10 Essential (primary) hypertension: Secondary | ICD-10-CM

## 2017-02-07 MED ORDER — LABETALOL HCL 100 MG PO TABS: 100.0000 mg | ORAL_TABLET | Freq: Every day | ORAL | 0 refills | Status: DC

## 2017-02-07 MED ORDER — PROPYLTHIOURACIL 50 MG PO TABS: 50.0000 mg | ORAL_TABLET | Freq: Three times a day (TID) | ORAL | 0 refills | Status: DC

## 2017-02-07 NOTE — Telephone Encounter (Signed)
CMA call patient to get more information on what medication she needs to be prescribe   Patient was aware and understood

## 2017-02-07 NOTE — Telephone Encounter (Signed)
Pt is calling to get a refill on her current meds. Please f/u with pt.

## 2017-02-07 NOTE — Telephone Encounter (Signed)
Refill for medications were placed for 30 day supply . Recommend follow up office visit.

## 2017-02-10 NOTE — Telephone Encounter (Signed)
CMA call to inform patient that her RX is already been sent to the pharmacy   Patient did not answer but left a VM stating the reason of the call & if have any questions just to call back

## 2017-02-20 ENCOUNTER — Ambulatory Visit: Payer: Self-pay | Attending: Family Medicine | Admitting: Family Medicine

## 2017-02-20 VITALS — BP 127/82 | HR 85 | Temp 98.7°F | Ht 64.0 in | Wt 182.2 lb

## 2017-02-20 DIAGNOSIS — E0590 Thyrotoxicosis, unspecified without thyrotoxic crisis or storm: Secondary | ICD-10-CM

## 2017-02-20 DIAGNOSIS — I10 Essential (primary) hypertension: Secondary | ICD-10-CM

## 2017-02-20 DIAGNOSIS — Z79899 Other long term (current) drug therapy: Secondary | ICD-10-CM | POA: Insufficient documentation

## 2017-02-20 DIAGNOSIS — Z760 Encounter for issue of repeat prescription: Secondary | ICD-10-CM | POA: Insufficient documentation

## 2017-02-20 MED ORDER — LABETALOL HCL 100 MG PO TABS: 100.0000 mg | ORAL_TABLET | Freq: Every day | ORAL | 2 refills | Status: DC

## 2017-02-20 MED ORDER — PROPYLTHIOURACIL 50 MG PO TABS: 50.0000 mg | ORAL_TABLET | Freq: Three times a day (TID) | ORAL | 2 refills | Status: DC

## 2017-02-20 NOTE — Progress Notes (Signed)
Subjective:  Patient ID: Maria Daniel, female    DOB: [birth year]  Age: 36 y.o. MRN: [id number]  CC: Hypertension   HPI Maria Daniel presents for   HTN: Reports being with blood pressure medications for 2 weeks. Reports not taking medication as prescribed. Reports blood pressures seemed to be increased in the afternoon and reports taking 2 pills in the evening on occasion. She does not have blood pressure cuff at home. She does report taking her blood pressure on occasion at local pharmacy but does not keep log of BP. Denies any CP, SOB, or swelling of the BLE. Request medication refill.  Hyperthyroid: Reports adherence with thyroid medication. Reports symptoms have improved since taking medication. Denies any CP, heart palpitations, or dizziness.    Outpatient Medications Prior to Visit  Medication Sig Dispense Refill  . labetalol (NORMODYNE) 100 MG tablet Take 1 tablet (100 mg total) by mouth daily. 30 tablet 0  . propylthiouracil (PTU) 50 MG tablet Take 1 tablet (50 mg total) by mouth 3 (three) times daily. 90 tablet 0   Facility-Administered Medications Prior to Visit  Medication Dose Route Frequency Provider Last Rate Last Dose  . influenza  inactive virus vaccine (FLUZONE/FLUARIX) injection 0.5 mL  0.5 mL Intramuscular Once Adam Phenix, MD        ROS Review of Systems  Constitutional: Negative.   Respiratory: Negative.   Cardiovascular: Negative.   Gastrointestinal: Negative.   Skin: Negative.     Objective:  BP 127/82 (BP Location: Left Arm, Patient Position: Sitting, Cuff Size: Normal)   Pulse 85   Temp 98.7 F (37.1 C) (Oral)   Ht 5\' 4"  (1.626 m)   Wt 182 lb 3.2 oz (82.6 kg)   BMI 31.27 kg/m   BP/Weight 02/20/2017 01/22/2017 12/04/2016  Systolic BP 127 133 90  Diastolic BP 82 84 58  Wt. (Lbs) 182.2 181.4 184  BMI 31.27 32.13 32.59    Physical Exam  Cardiovascular: Normal rate, regular rhythm, normal heart sounds and intact distal  pulses.   Pulmonary/Chest: Effort normal and breath sounds normal.  Abdominal: Soft. Bowel sounds are normal.  Skin: Skin is warm and dry.  Nursing note and vitals reviewed.   Assessment & Plan:   Problem List Items Addressed This Visit      Cardiovascular and Mediastinum   Hypertension   -Encouraged to take medication as prescribed. Check BP 3 times a week , twice a day.    -Bring BP to next office visit.    -Schedule follow up in 2 week for hypertension.   Relevant Medications   labetalol (NORMODYNE) 100 MG tablet    Other Visit Diagnoses    Hyperthyroidism    -  Primary   Relevant Medications   labetalol (NORMODYNE) 100 MG tablet   propylthiouracil (PTU) 50 MG tablet   Other Relevant Orders   Thyroid Panel With TSH (Completed)      Meds ordered this encounter  Medications  . labetalol (NORMODYNE) 100 MG tablet    Sig: Take 1 tablet (100 mg total) by mouth daily.    Dispense:  30 tablet    Refill:  2    Order Specific Question:   Supervising Provider    Answer:   Quentin Angst [id number]  . propylthiouracil (PTU) 50 MG tablet    Sig: Take 1 tablet (50 mg total) by mouth 3 (three) times daily.    Dispense:  90 tablet    Refill:  2    Order  Specific Question:   Supervising Provider    Answer:   Quentin AngstJEGEDE, OLUGBEMIGA E [[id number]]    Follow-up: Return in about 2 weeks (around 03/06/2017) for Hypertension .   Maria BarkMandesia R Itzelle Gains FNP

## 2017-02-20 NOTE — Patient Instructions (Addendum)
Start keeping log of blood pressures to bring to next visit.   Hipertensin Hypertension El trmino hipertensin es otra forma de denominar a la presin arterial elevada. La presin arterial elevada fuerza al corazn a trabajar ms para bombear la sangre. Esto puede causar problemas con el paso del [place]. Una lectura de presin arterial est compuesta por 2 nmeros. Hay un nmero superior (sistlico) sobre un nmero inferior (diastlico). Lo ideal es tener la presin arterial por debajo de 120/80. Las decisiones saludables pueden ayudarle a disminuir su presin arterial. Es posible que necesite medicamentos que le ayuden a disminuir su presin arterial si:  Su presin arterial no disminuye mediante decisiones saludables.  Su presin arterial est por encima de 130/80. Siga estas instrucciones en su casa: Comida y bebida   Si se lo indican, siga el plan de alimentacin de DASH (Dietary Approaches to Stop Hypertension, Maneras de alimentarse para detener la hipertensin). Esta dieta incluye:  Que la mitad del plato de cada comida sea de frutas y verduras.  Que un cuarto del plato de cada comida sea de cereales integrales. Los cereales integrales incluyen pasta integral, arroz integral y pan integral.  Comer y beber productos lcteos con bajo contenido de Goofy Ridgegrasa, como leche descremada o yogur bajo en grasas.  Que un cuarto del plato de cada comida sea de protenas bajas en grasa [place](magras). Las protenas bajas en grasa incluyen pescado, pollo sin piel, huevos, frijoles y tofu.  Evitar consumir carne grasa, carne curada y procesada, o pollo con piel.  Evitar consumir alimentos prehechos o procesados.  Consuma menos de 1500 mg de sal (sodio) por da.  Limite el consumo de alcohol a no ms de 1 medida por da si es mujer y no est Orthoptistembarazada y a 2 medidas por da si es hombre. Una medida equivale a 12onzas de cerveza, 5onzas de vino o 1onzas de bebidas alcohlicas de alta graduacin. Estilo  de vida   Trabaje con su mdico para mantenerse en un peso saludable o para perder peso. Pregntele a su mdico cul es el peso recomendable para usted.  Realice al menos 30 minutos de ejercicio que haga que se acelere su corazn (ejercicio Magazine features editoraerbico) la DIRECTVmayora de los das de la [place]. Estos pueden incluir caminar, nadar o andar en bicicleta.  Realice al menos 30 minutos de ejercicio que fortalezca sus msculos (ejercicios de resistencia) al menos 3 das a la [place]. Estos pueden incluir levantar pesas o hacer pilates.  No consuma ningn producto que contenga nicotina o tabaco. Esto incluye cigarrillos y cigarrillos electrnicos. Si necesita ayuda para dejar de fumar, consulte al American Expressmdico.  Controle su presin arterial en su casa tal como le indic el mdico.  Concurra a todas las visitas de control como se lo haya indicado el mdico. Esto es importante. Medicamentos   Baxter Internationalome los medicamentos de venta libre y los recetados solamente como se lo haya indicado el mdico. Siga cuidadosamente las indicaciones.  No omita las dosis de medicamentos para la presin arterial. Los medicamentos pierden eficacia si omite dosis. El hecho de omitir las dosis tambin [place]umenta el riesgo de otros problemas.  Pregntele a su mdico a qu efectos secundarios o reacciones a los Museum/gallery curatormedicamentos debe prestar atencin. Comunquese con un mdico si:  Piensa que tiene [place] reaccin a los medicamentos que est tomando.  Tiene dolores de cabeza frecuentes (recurrentes).  Siente mareos.  Tiene hinchazn en los tobillos.  Tiene problemas de visin. Solicite ayuda de inmediato si:  Freight forwarderiente un dolor de  cabeza muy intenso.  Comienza a sentirse confundido.  Se siente dbil o adormecido.  Siente que va a desmayarse.  Siente un dolor muy intenso en:  El pecho.  El vientre (abdomen).  Devuelve (vomita) ms de una vez.  Tiene dificultad para respirar. Resumen  El trmino hipertensin es otra forma de denominar  a la presin arterial elevada.  Las decisiones saludables pueden ayudarle a disminuir su presin arterial. Si no puede controlar su presin arterial mediante decisiones saludables, es posible que deba tomar medicamentos. Esta informacin no tiene Theme park manager el consejo del mdico. Asegrese de hacerle al mdico cualquier pregunta que tenga. Document Released: 05/01/2010 Document Revised: 10/23/2016 Document Reviewed: 10/23/2016 Elsevier Interactive Patient Education  2017 [employer].

## 2017-02-20 NOTE — Progress Notes (Signed)
Patient is here fro f/up BP  Patient has taking her BP medication for today  Patient denies pain for today  Patient has not eaten today

## 2017-02-21 LAB — THYROID PANEL WITH TSH
Free Thyroxine Index: 10.1 — ABNORMAL HIGH (ref 1.2–4.9)
T3 UPTAKE RATIO: 46 % — AB (ref 24–39)
T4, Total: 21.9 ug/dL (ref 4.5–12.0)
TSH: <0.006 u[IU]/mL — ABNORMAL LOW (ref 0.450–4.500)

## 2017-02-24 ENCOUNTER — Other Ambulatory Visit: Payer: Self-pay | Admitting: Family Medicine

## 2017-02-25 ENCOUNTER — Other Ambulatory Visit: Payer: Self-pay | Admitting: Family Medicine

## 2017-02-25 ENCOUNTER — Telehealth: Payer: Self-pay

## 2017-02-25 DIAGNOSIS — E0590 Thyrotoxicosis, unspecified without thyrotoxic crisis or storm: Secondary | ICD-10-CM

## 2017-02-25 NOTE — Telephone Encounter (Signed)
CMA call to inform patient about results  Patient Verify DOB  Patient was aware and understood   

## 2017-02-25 NOTE — Telephone Encounter (Signed)
-----   Message from [name], FNP sent at 02/25/2017  3:59 PM EDT ----- -Thyroid function test shows that your thyroid function is still elevated. It is important that you take your medication for thyroid as prescribed. -You will be referred to endocrinology.

## 2017-02-27 ENCOUNTER — Ambulatory Visit: Payer: Self-pay | Attending: Family Medicine

## 2017-03-06 ENCOUNTER — Ambulatory Visit: Payer: Self-pay | Admitting: Family Medicine

## 2017-03-18 ENCOUNTER — Other Ambulatory Visit: Payer: Self-pay | Admitting: Family Medicine

## 2017-03-18 ENCOUNTER — Telehealth: Payer: Self-pay | Admitting: Family Medicine

## 2017-03-18 DIAGNOSIS — K0889 Other specified disorders of teeth and supporting structures: Secondary | ICD-10-CM

## 2017-03-18 MED ORDER — IBUPROFEN 600 MG PO TABS: 600.0000 mg | ORAL_TABLET | Freq: Three times a day (TID) | ORAL | 0 refills | Status: DC | PRN

## 2017-03-18 NOTE — Telephone Encounter (Signed)
Pt. Called requesting to be referred to the dentist. Pt. States that one of her bottom tooth chipped and when she drinks cold and hot drinks it hurts. Please f/u with pt.

## 2017-03-18 NOTE — Telephone Encounter (Signed)
Please ask whether tooth was chipped by trauma or if tooth is chipped from dental decay. Referral for dentist will be placed. If she does not currenlty have Medicaid or insurance coverage she will need to apply for orange card to complete referral process. Will prescribe ibuprofen for dental pain.

## 2017-03-18 NOTE — Telephone Encounter (Signed)
Pt. Called requesting to be referred to the dentist. Pt. States that one of her bottom tooth chipped and when she drinks cold and hot drinks it hurts. Please f/u with pt.

## 2017-03-20 ENCOUNTER — Encounter: Payer: Self-pay | Admitting: Endocrinology

## 2017-03-31 ENCOUNTER — Ambulatory Visit: Payer: Self-pay | Attending: Family Medicine | Admitting: Family Medicine

## 2017-03-31 VITALS — BP 127/79 | HR 93 | Temp 98.6°F | Resp 18 | Ht 63.0 in | Wt 177.0 lb

## 2017-03-31 DIAGNOSIS — M79609 Pain in unspecified limb: Secondary | ICD-10-CM

## 2017-03-31 DIAGNOSIS — R202 Paresthesia of skin: Secondary | ICD-10-CM | POA: Insufficient documentation

## 2017-03-31 DIAGNOSIS — R200 Anesthesia of skin: Secondary | ICD-10-CM | POA: Insufficient documentation

## 2017-03-31 DIAGNOSIS — E0590 Thyrotoxicosis, unspecified without thyrotoxic crisis or storm: Secondary | ICD-10-CM | POA: Insufficient documentation

## 2017-03-31 DIAGNOSIS — I10 Essential (primary) hypertension: Secondary | ICD-10-CM | POA: Insufficient documentation

## 2017-03-31 DIAGNOSIS — M79602 Pain in left arm: Secondary | ICD-10-CM | POA: Insufficient documentation

## 2017-03-31 MED ORDER — LABETALOL HCL 100 MG PO TABS: 100.0000 mg | ORAL_TABLET | Freq: Every day | ORAL | 1 refills | Status: DC

## 2017-03-31 MED ORDER — PROPYLTHIOURACIL 50 MG PO TABS: 50.0000 mg | ORAL_TABLET | Freq: Three times a day (TID) | ORAL | 2 refills | Status: DC

## 2017-03-31 MED ORDER — NAPROXEN 500 MG PO TABS: 500.0000 mg | ORAL_TABLET | Freq: Two times a day (BID) | ORAL | 0 refills | Status: DC

## 2017-03-31 NOTE — Patient Instructions (Signed)
Musculoskeletal Pain  Musculoskeletal pain is muscle and bone aches and pains. This pain can occur in any part of the body.  Follow these instructions at home:  · Only take medicines for pain, discomfort, or fever as told by your health care provider.  · You may continue all activities unless the activities cause more pain. When the pain lessens, slowly resume normal activities. Gradually increase the intensity and duration of the activities or exercise.  · During periods of severe pain, bed rest may be helpful. Lie or sit in any position that is comfortable, but get out of bed and walk around at least every several hours.  · If directed, put ice on the injured area.  ? Put ice in a plastic bag.  ? Place a towel between your skin and the bag.  ? Leave the ice on for 20 minutes, 2-3 times a day.  Contact a health care provider if:  · Your pain is getting worse.  · Your pain is not relieved with medicines.  · You lose function in the area of the pain if the pain is in your arms, legs, or neck.  This information is not intended to replace advice given to you by your health care provider. Make sure you discuss any questions you have with your health care provider.  Document Released: 11/11/2005 Document Revised: 04/23/2016 Document Reviewed: 07/16/2013  Elsevier Interactive Patient Education © 2017 Elsevier Inc.

## 2017-03-31 NOTE — Progress Notes (Signed)
Patient is here for f/up  Patient complains about left arm pain mostly when bends

## 2017-03-31 NOTE — Progress Notes (Signed)
Subjective:  Patient ID: Maria Daniel, female    DOB: 08/28/1981  Age: 36 y.o. MRN: [id number]  CC: No chief complaint on file.   HPI Maria Lyonsrma Daniel presents for   HTN: Reports adherence with blood pressure medication. She does not have blood pressure cuff at home. She does report taking her blood pressure on occasion at local pharmacy but does not keep log of BP. Denies any CP, SOB, or swelling of the BLE.   Hyperthyroid: Reports non-adherence with thyroid medication. She reports last taking medication 15 days ago Denies any CP, heart palpitations, or dizziness.   Left arm pain: 6 month history. Worsened 1 month ago. She reports stiffness and numbness that is occurs only with sleeping.  Denies any history of injury, swelling, or temperature change of the extremity. Denies taking anything or symptoms.    Outpatient Medications Prior to Visit  Medication Sig Dispense Refill  . labetalol (NORMODYNE) 100 MG tablet Take 1 tablet (100 mg total) by mouth daily. 30 tablet 2  . ibuprofen (ADVIL,MOTRIN) 600 MG tablet Take 1 tablet (600 mg total) by mouth every 8 (eight) hours as needed (Take with food). (Patient not taking: Reported on 03/31/2017) 30 tablet 0  . propylthiouracil (PTU) 50 MG tablet Take 1 tablet (50 mg total) by mouth 3 (three) times daily. (Patient not taking: Reported on 03/31/2017) 90 tablet 2   Facility-Administered Medications Prior to Visit  Medication Dose Route Frequency Provider Last Rate Last Dose  . influenza  inactive virus vaccine (FLUZONE/FLUARIX) injection 0.5 mL  0.5 mL Intramuscular Once Adam PhenixArnold, James G, MD        ROS Review of Systems  Constitutional: Negative.   Eyes: Negative.   Respiratory: Negative.   Cardiovascular: Negative.   Gastrointestinal: Negative.   Musculoskeletal: Positive for arthralgias and myalgias.  Skin: Negative.   Neurological: Negative.   Psychiatric/Behavioral: Negative.     Objective:  BP 127/79 (BP Location:  Left Arm, Patient Position: Sitting, Cuff Size: Normal)   Pulse 93   Temp 98.6 F (37 C) (Oral)   Resp 18   Ht 5\' 3"  (1.6 m)   Wt 177 lb (80.3 kg)   SpO2 99%   BMI 31.35 kg/m   BP/Weight 03/31/2017 02/20/2017 01/22/2017  Systolic BP 127 127 133  Diastolic BP 79 82 84  Wt. (Lbs) 177 182.2 181.4  BMI 31.35 31.27 32.13    Physical Exam  Eyes: Conjunctivae are normal. Pupils are equal, round, and reactive to light.  Neck: No JVD present.  Cardiovascular: Normal rate, regular rhythm, normal heart sounds and intact distal pulses.   Pulmonary/Chest: Effort normal and breath sounds normal.  Abdominal: Soft. Bowel sounds are normal.  Musculoskeletal:  LUE pain with flexion.   Skin: Skin is warm and dry.  Nursing note and vitals reviewed.    Assessment & Plan:   Problem List Items Addressed This Visit      Cardiovascular and Mediastinum   Follow up in 2 months for HTN.    Hypertension - Primary   Relevant Medications   labetalol (NORMODYNE) 100 MG tablet    Other Visit Diagnoses    Paresthesia and pain of left extremity       Relevant Medications   naproxen (NAPROSYN) 500 MG tablet   Other Relevant Orders   Ambulatory referral to Orthopedics   Hyperthyroidism       Infrequent use of hyperthyroid medication despite education on its importance.     Relevant Medications   propylthiouracil (PTU)  50 MG tablet   Other Relevant Orders   Ambulatory referral to Endocrinology      Meds ordered this encounter  Medications  . DISCONTD: labetalol (NORMODYNE) 100 MG tablet    Sig: Take 1 tablet (100 mg total) by mouth daily.    Dispense:  90 tablet    Refill:  1    Order Specific Question:   Supervising Provider    Answer:   Quentin Angst [id number]  . DISCONTD: propylthiouracil (PTU) 50 MG tablet    Sig: Take 1 tablet (50 mg total) by mouth 3 (three) times daily.    Dispense:  90 tablet    Refill:  2    Order Specific Question:   Supervising Provider    Answer:    Quentin Angst [id number]  . DISCONTD: naproxen (NAPROSYN) 500 MG tablet    Sig: Take 1 tablet (500 mg total) by mouth 2 (two) times daily with a meal.    Dispense:  30 tablet    Refill:  0    Order Specific Question:   Supervising Provider    Answer:   Quentin Angst [id number]  . naproxen (NAPROSYN) 500 MG tablet    Sig: Take 1 tablet (500 mg total) by mouth 2 (two) times daily with a meal.    Dispense:  30 tablet    Refill:  0    Order Specific Question:   Supervising Provider    Answer:   Quentin Angst [id number]  . propylthiouracil (PTU) 50 MG tablet    Sig: Take 1 tablet (50 mg total) by mouth 3 (three) times daily.    Dispense:  90 tablet    Refill:  2    Order Specific Question:   Supervising Provider    Answer:   Quentin Angst [id number]  . labetalol (NORMODYNE) 100 MG tablet    Sig: Take 1 tablet (100 mg total) by mouth daily.    Dispense:  90 tablet    Refill:  1    Order Specific Question:   Supervising Provider    Answer:   Quentin Angst [[id number]]      [name] FNP

## 2017-04-17 ENCOUNTER — Ambulatory Visit: Payer: Self-pay | Admitting: Sports Medicine

## 2017-04-28 ENCOUNTER — Ambulatory Visit: Payer: Self-pay | Admitting: Sports Medicine

## 2017-05-05 ENCOUNTER — Encounter: Payer: Self-pay | Admitting: Endocrinology

## 2017-08-31 ENCOUNTER — Other Ambulatory Visit: Payer: Self-pay | Admitting: Family Medicine

## 2017-08-31 DIAGNOSIS — M79609 Pain in unspecified limb: Secondary | ICD-10-CM

## 2017-08-31 DIAGNOSIS — R202 Paresthesia of skin: Principal | ICD-10-CM

## 2017-09-02 MED ORDER — NAPROXEN 500 MG PO TABS: 500.0000 mg | ORAL_TABLET | Freq: Two times a day (BID) | ORAL | 0 refills | Status: DC

## 2017-09-03 NOTE — Telephone Encounter (Signed)
CMA call regarding medication refill is been sent to pharmacy   Patient was aware and understood

## 2017-09-13 ENCOUNTER — Other Ambulatory Visit: Payer: Self-pay | Admitting: Advanced Practice Midwife

## 2017-11-03 ENCOUNTER — Ambulatory Visit: Payer: Self-pay | Admitting: Family Medicine

## 2017-11-03 ENCOUNTER — Ambulatory Visit: Payer: Self-pay

## 2017-11-10 ENCOUNTER — Ambulatory Visit: Payer: Self-pay | Attending: Family Medicine

## 2017-12-01 ENCOUNTER — Other Ambulatory Visit: Payer: Self-pay | Admitting: Family Medicine

## 2017-12-01 ENCOUNTER — Encounter: Payer: Self-pay | Admitting: Family Medicine

## 2017-12-01 ENCOUNTER — Ambulatory Visit: Payer: Self-pay | Attending: Family Medicine | Admitting: Family Medicine

## 2017-12-01 VITALS — BP 144/81 | HR 74 | Temp 97.8°F | Resp 18 | Ht 64.0 in | Wt 184.0 lb

## 2017-12-01 DIAGNOSIS — Z8639 Personal history of other endocrine, nutritional and metabolic disease: Secondary | ICD-10-CM

## 2017-12-01 DIAGNOSIS — Z9114 Patient's other noncompliance with medication regimen: Secondary | ICD-10-CM

## 2017-12-01 DIAGNOSIS — E119 Type 2 diabetes mellitus without complications: Secondary | ICD-10-CM | POA: Insufficient documentation

## 2017-12-01 DIAGNOSIS — Z79899 Other long term (current) drug therapy: Secondary | ICD-10-CM | POA: Insufficient documentation

## 2017-12-01 DIAGNOSIS — E0590 Thyrotoxicosis, unspecified without thyrotoxic crisis or storm: Secondary | ICD-10-CM

## 2017-12-01 DIAGNOSIS — N912 Amenorrhea, unspecified: Secondary | ICD-10-CM | POA: Insufficient documentation

## 2017-12-01 DIAGNOSIS — Z3201 Encounter for pregnancy test, result positive: Secondary | ICD-10-CM

## 2017-12-01 DIAGNOSIS — I10 Essential (primary) hypertension: Secondary | ICD-10-CM

## 2017-12-01 DIAGNOSIS — Z13220 Encounter for screening for lipoid disorders: Secondary | ICD-10-CM

## 2017-12-01 DIAGNOSIS — N926 Irregular menstruation, unspecified: Secondary | ICD-10-CM

## 2017-12-01 LAB — POCT URINE PREGNANCY: Preg Test, Ur: POSITIVE — AB

## 2017-12-01 LAB — HEMOGLOBIN A1C: Hemoglobin A1C: 5.5

## 2017-12-01 MED ORDER — LABETALOL HCL 100 MG PO TABS
100.0000 mg | ORAL_TABLET | Freq: Every day | ORAL | 2 refills | Status: DC
Start: 2017-12-01 — End: 2018-05-25

## 2017-12-01 MED ORDER — PRENATAL/FOLIC ACID PO TABS: 1.0000 | ORAL_TABLET | Freq: Every day | ORAL | 11 refills | Status: DC

## 2017-12-01 MED ORDER — METOPROLOL SUCCINATE ER 25 MG PO TB24: 25.0000 mg | ORAL_TABLET | Freq: Every day | ORAL | 2 refills | Status: DC

## 2017-12-01 NOTE — Progress Notes (Signed)
Subjective:  Patient ID: Maria Daniel, female    DOB: 1981-02-09  Age: 37 y.o. MRN: [id number]  CC: Hypertension   HPI Azalyn Sliwa presents for hypertension. Interpreter services used. She reports non- adherence with blood pressure medication.  She believes medication was causing her BP to increased and d/c taking over 1 month ago. She does not have blood pressure cuff at home. She does report taking her blood pressure on occasion at local pharmacy but does bring  keep log of BP. No cardiac symptoms. Hyperthyroid: Reports non-adherence with thyroid medication. She reports d/c medication on her own 2 month ago.  No symptoms. She states " I felt better so I didn't take it". Missed menstrual period: She is currently breastfeeding. She reports menstrual cycle for th months of Aug., Oct., and Sept.. Her LMP was Nov.15 th, 2018. She request pregnancy test.   Outpatient Medications Prior to Visit  Medication Sig Dispense Refill  . ketorolac (TORADOL) 10 MG tablet TAKE ONE TABLET BY MOUTH EVERY 6 HOURS AS NEEDED 20 tablet 0  . naproxen (NAPROSYN) 500 MG tablet Take 1 tablet (500 mg total) by mouth 2 (two) times daily with a meal. 30 tablet 0  . propylthiouracil (PTU) 50 MG tablet Take 1 tablet (50 mg total) by mouth 3 (three) times daily. 90 tablet 2  . ibuprofen (ADVIL,MOTRIN) 600 MG tablet Take 1 tablet (600 mg total) by mouth every 8 (eight) hours as needed (Take with food). (Patient not taking: Reported on 03/31/2017) 30 tablet 0  . labetalol (NORMODYNE) 100 MG tablet Take 1 tablet (100 mg total) by mouth daily. 90 tablet 1  . influenza  inactive virus vaccine (FLUZONE/FLUARIX) injection 0.5 mL      No facility-administered medications prior to visit.     ROS Review of Systems  Constitutional: Negative.   Eyes: Negative.   Respiratory: Negative.   Cardiovascular: Negative.   Gastrointestinal: Negative.   Genitourinary: Positive for menstrual problem (missed).  Skin:  Negative.   Neurological: Negative.   Psychiatric/Behavioral: Negative.     Objective:  BP (!) 144/81 (BP Location: Left Arm, Patient Position: Sitting, Cuff Size: Large)   Pulse 74   Temp 97.8 F (36.6 C) (Oral)   Resp 18   Ht 5\' 4"  (1.626 m)   Wt 184 lb (83.5 kg)   SpO2 100%   BMI 31.58 kg/m   BP/Weight 12/01/2017 03/31/2017 02/20/2017  Systolic BP 144 127 127  Diastolic BP 81 79 82  Wt. (Lbs) 184 177 182.2  BMI 31.58 31.35 31.27    Physical Exam  Constitutional: She appears well-developed and well-nourished.  Eyes: Conjunctivae are normal.  Neck: Normal range of motion. Neck supple. No thyromegaly present.  Cardiovascular: Normal rate, regular rhythm, normal heart sounds and intact distal pulses.  Pulmonary/Chest: Effort normal and breath sounds normal.  Abdominal: Soft. Bowel sounds are normal.  Lymphadenopathy:    She has no cervical adenopathy.  Skin: Skin is warm and dry.  Psychiatric: She has a normal mood and affect.  Nursing note and vitals reviewed.    Assessment & Plan:   1. Essential hypertension Initially prescribed metoprolol, d/c due to + urine pregnancy test. Restart labetalol. BP check in 1 week w/ clinical pharmacist. Follow up with PCP in 8 weeks. - CMP and Liver - labetalol (NORMODYNE) 100 MG tablet; Take 1 tablet (100 mg total) by mouth daily.  Dispense: 30 tablet; Refill: 2  2. Hyperthyroidism  - Thyroid Panel With TSH  3. Non compliance  w medication regimen Discussed importance of medication adherence with patient, risks and benefits.  4. Screening cholesterol level  - CMP and Liver - Lipid Panel  5. History of diabetes mellitus, type II  - Hemoglobin A1c  6. Missed menses  - POCT urine pregnancy - hCG, serum, qualitative  7. Positive urine pregnancy test  - Prenatal Vit-Fe Fumarate-FA (PRENATAL/FOLIC ACID) TABS; Take 1 tablet by mouth daily.  Dispense: 30 each; Refill: 11 - Ambulatory referral to Obstetrics /  Gynecology  Follow up: Return in about 1 week (around 12/08/2017) for BP check with Stacy.   Lizbeth BarkMandesia R Hairston FNP

## 2017-12-01 NOTE — Patient Instructions (Addendum)
Hipertiroidismo Hyperthyroidism El hipertiroidismo ocurre cuando la tiroides est demasiado activa (hiperactiva). La tiroides es una glndula grande ubicada en el cuello que ayuda a Chief Operating Officer la forma en que el organismo [place] los alimentos (metabolismo). Cuando la tiroides est hiperactiva, produce una cantidad excesiva de una hormona llamada tiroxina. Cules son las causas? Las causas del hipertiroidismo pueden incluir lo siguiente:  Enfermedad de [place]. Se produce cuando el sistema inmunitario ataca la tiroides. Esta es la causa ms frecuente.  Inflamacin de la tiroides.  Tumor en la tiroides o Chief of Staff.  Uso excesivo de medicamentos para la tiroides, entre ellos: ? Suplementos recetados para la tiroides. ? Suplementos a base de hierbas que imitan a las hormonas tiroideas.  Bultos slidos o llenos de lquido en la tiroides (ndulos tiroideos).  Ingesta excesiva de yodo.  Qu incrementa el riesgo?  Ser mujer.  Tener antecedentes familiares de enfermedades tiroideas. Cules son los signos o los sntomas? Los signos y los sntomas de hipertiroidismo pueden ser los siguientes:  Nerviosismo.  Incapacidad para Patent examiner.  Prdida de peso sin causa aparente.  Diarrea.  Cambios en la textura del pelo o la piel.  Falta de latidos cardacos o ms latidos cardacos.  Frecuencia cardaca acelerada.  Ausencia de la [employer].  Temblores en las manos.  Fatiga.  Agitacin.  Aumento del apetito.  Problemas para dormir.  Aumento del tamao de la tiroides o ndulos.  Cmo se diagnostica? El diagnstico de hipertiroidismo puede incluir lo siguiente:  Examen fsico y antecedentes mdicos.  Anlisis de [place].  Ecografas.  Cmo se trata? El tratamiento puede incluir lo siguiente:  Medicamentos para Scientist, physiological tiroides.  Ciruga para extirpar la tiroides.  Radioterapia.  Siga estas instrucciones en su casa:  Tome los medicamentos  solamente como se lo haya indicado el mdico.  No consuma ningn producto que contenga tabaco, lo que incluye cigarrillos, tabaco de Theatre manager o Administrator, Civil Service. Si necesita ayuda para dejar de fumar, consulte al mdico.  No reanude la actividad fsica ni los ejercicios hasta que el mdico lo autorice.  Concurra a todas las visitas de control, segn le indique su mdico. Esto es importante. Comunquese con un mdico si:  Los sntomas no mejoran con Scientist, research (medical).  Tiene fiebre.  Est tomando medicamentos sustitutivos de la tiroides y: ? Est deprimido. ? Se siente mental y fsicamente lento. ? Aumenta de [place]. Solicite ayuda de inmediato si:  Disminuy su estado de alerta o hay cambios en la conciencia.  Siente dolor abdominal.  Siente mareos.  Tiene una frecuencia cardaca acelerada.  Tiene latidos cardacos irregulares. Esta informacin no tiene Theme park manager el consejo del mdico. Asegrese de hacerle al mdico cualquier pregunta que tenga. Document Released: 11/11/2005 Document Revised: 02/17/2017 Document Reviewed: 03/29/2014 Elsevier Interactive Patient Education  2018 Elsevier Inc.   Cmo controlar su hipertensin Managing Your Hypertension La hipertensin se denomina usualmente presin arterial alta. Ocurre cuando la sangre presiona contra las paredes de las arterias con demasiada fuerza. Las arterias son los vasos sanguneos que transportan la sangre desde el corazn hacia todas las partes del cuerpo. La hipertensin hace que el corazn haga ms esfuerzo para Insurance account manager y [employer] que las arterias se Armed forces training and education officer o Multimedia programmer. La hipertensin no tratada o no controlada puede causar infarto de miocardio, accidente cerebrovascular, enfermedad renal y otros problemas. Qu son las Merchandiser, retail de presin arterial? Una lectura de la presin arterial consiste de un nmero ms alto sobre un nmero ms bajo. En condiciones ideales,  la presin arterial debe  estar por debajo de 120/80. El primer nmero ("superior") es la presin sistlica. Es la medida de la presin de las arterias cuando el corazn late. El segundo nmero ("inferior") es la presin diastlica. Es la medida de la presin en las arterias cuando el corazn se relaja. Qu significa mi lectura de presin arterial? La presin arterial se clasifica en cuatro etapas. Sobre la base de la lectura de su presin arterial, el mdico puede usar las siguientes etapas para determinar si necesita tratamiento y de qu tipo. La presin sistlica y la presin diastlica se miden en una unidad llamada mm Hg. Normal  Presin sistlica: por debajo de 120.  Presin diastlica: por debajo de 80. Elevada  Presin sistlica: 120-129.  Presin diastlica: por debajo de 80. Etapa 1 de hipertensin  Presin sistlica: 130-139.  Presin diastlica: 80-89. Etapa 2 de hipertensin  Presin sistlica: 140 o ms.  Presin diastlica: 90 o ms. Cules son los riesgos para la salud asociados con la hipertensin? Controlar la hipertensin es una responsabilidad importante. La hipertensin no controlada puede causar:  Infarto de miocardio.  Accidente cerebrovascular.  Debilitamiento de los vasos sanguneos (aneurisma).  Insuficiencia cardaca.  Dao renal.  Dao ocular.  Sndrome metablico.  Problemas de memoria y concentracin.  Qu cambios puedo hacer para controlar mi hipertensin? La hipertensin se puede controlar haciendo Danaher Corporationcambios en el estilo de vida y, posiblemente, tomando medicamentos. Su mdico le ayudar a crear un plan para bajar la presin arterial al rango normal. Comida y bebida  Siga una dieta con alto contenido de fibras y [place], y con bajo contenido de sal (sodio), azcar agregada y Rosalin Hawkinggrasas. Un ejemplo de plan alimenticio es la dieta DASH (Dietary Approaches to Stop Hypertension, Mtodos alimenticios para detener la hipertensin). Para alimentarse de esta manera: ? Coma  mucha fruta y verdura fresca. Trate de que la mitad del plato de cada comida sea de frutas y verduras. ? Coma cereales integrales, como pasta integral, arroz integral y pan integral. Llene aproximadamente un cuarto del plato con cereales integrales. ? Consuma productos lcteos con bajo contenido de grasa. ? Evite la ingesta de cortes de carne grasa, carne procesada o curada, y carne de ave con piel. Llene aproximadamente un cuarto del plato con protenas magras, como pescado, pollo sin piel, frijoles, huevos y tofu. ? Evite ingerir alimentos prehechos o procesados. En general, estos tienen mayor cantidad de sodio, azcar agregada y Steffanie Rainwatergrasa.  Reduzca su ingesta diaria de sodio. La mayora de las personas que tienen hipertensin deben comer menos de 1500 mg de sodio por C.H. Robinson Worldwideda.  Limite el consumo de alcohol a no ms de 1 medida por da si es mujer y no est Orthoptistembarazada y a 2 medidas por da si es hombre. Una medida equivale a 12onzas de cerveza, 5onzas de vino o 1onzas de bebidas alcohlicas de alta graduacin. Estilo de vida  Trabaje con su mdico para mantener un peso saludable o Curatorperder peso. Pregntele cual es su peso recomendado.  Realice al menos 30 minutos de ejercicio que haga que se acelere su corazn (ejercicio Magazine features editoraerbico) la DIRECTVmayora de los das de la [place]semana. Estas actividades pueden incluir caminar, nadar o andar en bicicleta.  Incluya ejercicios para fortalecer sus msculos (ejercicios de resistencia), como levantamiento de pesas, como parte de su rutina semanal de ejercicios. Intente realizar 30minutos de este tipo de ejercicios al Kelloggmenos tres das a la [place]semana.  No consuma ningn producto que contenga nicotina o tabaco, como cigarrillos y  cigarrillos electrnicos. Si necesita ayuda para dejar de fumar, consulte al [employer].  Controle las enfermedades a largo plazo (crnicas), como el colesterol alto o la diabetes. Control  Contrlese la presin arterial en su casa segn las indicaciones del  mdico. La presin arterial deseada puede variar en funcin de las enfermedades, la edad y otros factores personales.  Contrlese la presin arterial de manera regular, en la frecuencia indicada por su mdico. Trabaje con su mdico  Revise con su mdico todos los medicamentos que toma ya que puede haber efectos secundarios o interacciones.  Hable con su mdico acerca de la dieta, hbitos de ejercicio y otros factores del estilo de vida que pueden contribuir a la hipertensin.  Consulte a su mdico regularmente. Su mdico puede ayudarle a crear y [place] su plan para controlar la hipertensin. Debo tomar un medicamento para controlar mi presin arterial? El mdico puede recetarle medicamentos si los cambios en el estilo de vida no son suficientes para Museum/gallery curator la presin arterial y si:  Su presin arterial sistlica es de 130 o ms.  Su presin arterial diastlica es de 80 o ms.  Tome los medicamentos solamente como se lo haya indicado el mdico. Siga cuidadosamente las indicaciones. Los medicamentos para la presin arterial deben tomarse segn las indicaciones. Los medicamentos pierden eficacia al omitir las dosis. El hecho de omitir las dosis tambin [place] el riesgo de otros problemas. Comunquese con un mdico si:  Piensa que tiene Runner, broadcasting/film/video a los medicamentos que ha tomado.  Tiene dolores de cabeza frecuentes (recurrentes).  Siente mareos.  Tiene hinchazn en los tobillos.  Tiene problemas de visin. Solicite ayuda de inmediato si:  Siente un dolor de cabeza intenso o confusin.  Siente debilidad inusual, adormecimiento o que Hospital doctor.  Siente un dolor intenso en el pecho o el abdomen.  Vomita repetidas veces.  Tiene dificultad para respirar. Resumen  La hipertensin se produce cuando la sangre bombea en las arterias con mucha fuerza. Si esta afeccin no se controla, podra correr riesgo de tener complicaciones graves.  La presin arterial  deseada puede variar en funcin de las enfermedades, la edad y otros factores personales. Para la [employer], una presin arterial normal es menor que 120/80.  La hipertensin se puede controlar mediante cambios en el estilo de vida, tomando medicamentos, o ambas cosas. Los Dana[employer] Corporation estilo de vida incluyen prdida de peso, ingerir alimentos sanos, seguir una dieta baja en sodio, hacer ms ejercicio y Glass blower/designer consumo de alcohol. Esta informacin no tiene Theme park manager el consejo del mdico. Asegrese de hacerle al mdico cualquier pregunta que tenga. Document Released: 08/05/2012 Document Revised: 10/23/2016 Document Reviewed: 10/23/2016 Elsevier Interactive Patient Education  [employer].

## 2017-12-02 LAB — THYROID PANEL WITH TSH
FREE THYROXINE INDEX: 5.6 — AB (ref 1.2–4.9)
T3 UPTAKE RATIO: 34 % (ref 24–39)
T4, Total: 16.6 ug/dL — ABNORMAL HIGH (ref 4.5–12.0)
TSH: <0.006 u[IU]/mL — ABNORMAL LOW (ref 0.450–4.500)

## 2017-12-02 LAB — CMP AND LIVER
ALK PHOS: 206 IU/L — AB (ref 39–117)
ALT: 22 IU/L (ref 0–32)
AST: 19 IU/L (ref 0–40)
Albumin: 4.2 g/dL (ref 3.5–5.5)
BUN: 12 mg/dL (ref 6–20)
Bilirubin Total: 0.3 mg/dL (ref 0.0–1.2)
Bilirubin, Direct: 0.12 mg/dL (ref 0.00–0.40)
CALCIUM: 9.5 mg/dL (ref 8.7–10.2)
CO2: 17 mmol/L — ABNORMAL LOW (ref 20–29)
CREATININE: 0.4 mg/dL — AB (ref 0.57–1.00)
Chloride: 104 mmol/L (ref 96–106)
GFR, EST AFRICAN AMERICAN: 153 mL/min/{1.73_m2} (ref >59)
GFR, EST NON AFRICAN AMERICAN: 133 mL/min/{1.73_m2} (ref >59)
GLUCOSE: 90 mg/dL (ref 65–99)
Potassium: 5.2 mmol/L (ref 3.5–5.2)
SODIUM: 137 mmol/L (ref 134–144)
TOTAL PROTEIN: 7.6 g/dL (ref 6.0–8.5)

## 2017-12-02 LAB — LIPID PANEL
CHOLESTEROL TOTAL: 148 mg/dL (ref 100–199)
Chol/HDL Ratio: 2.1 ratio (ref 0.0–4.4)
HDL: 72 mg/dL (ref >39)
LDL CALC: 62 mg/dL (ref 0–99)
Triglycerides: 68 mg/dL (ref 0–149)
VLDL CHOLESTEROL CAL: 14 mg/dL (ref 5–40)

## 2017-12-02 LAB — HEMOGLOBIN A1C
Est. average glucose Bld gHb Est-mCnc: 105 mg/dL
HEMOGLOBIN A1C: 5.3 % (ref 4.8–5.6)

## 2017-12-02 LAB — HCG, SERUM, QUALITATIVE: hCG,Beta Subunit,Qual,Serum: POSITIVE m[IU]/mL — AB (ref <6)

## 2017-12-03 ENCOUNTER — Other Ambulatory Visit: Payer: Self-pay | Admitting: Family Medicine

## 2017-12-03 DIAGNOSIS — E0590 Thyrotoxicosis, unspecified without thyrotoxic crisis or storm: Secondary | ICD-10-CM

## 2017-12-09 ENCOUNTER — Other Ambulatory Visit: Payer: Self-pay | Admitting: Family Medicine

## 2017-12-09 DIAGNOSIS — E0580 Other thyrotoxicosis without thyrotoxic crisis or storm: Secondary | ICD-10-CM

## 2017-12-09 DIAGNOSIS — E0590 Thyrotoxicosis, unspecified without thyrotoxic crisis or storm: Secondary | ICD-10-CM

## 2017-12-09 DIAGNOSIS — O99280 Endocrine, nutritional and metabolic diseases complicating pregnancy, unspecified trimester: Principal | ICD-10-CM

## 2017-12-09 DIAGNOSIS — Z8639 Personal history of other endocrine, nutritional and metabolic disease: Secondary | ICD-10-CM

## 2017-12-09 MED ORDER — PROPYLTHIOURACIL 50 MG PO TABS: 50.0000 mg | ORAL_TABLET | Freq: Two times a day (BID) | ORAL | 2 refills | Status: DC

## 2017-12-11 ENCOUNTER — Ambulatory Visit: Payer: Self-pay | Attending: Family Medicine | Admitting: Pharmacist

## 2017-12-11 ENCOUNTER — Encounter: Payer: Self-pay | Admitting: Pharmacist

## 2017-12-11 VITALS — BP 125/81 | HR 64

## 2017-12-11 DIAGNOSIS — I10 Essential (primary) hypertension: Secondary | ICD-10-CM | POA: Insufficient documentation

## 2017-12-11 DIAGNOSIS — Z331 Pregnant state, incidental: Secondary | ICD-10-CM | POA: Insufficient documentation

## 2017-12-11 NOTE — Patient Instructions (Addendum)
Thanks for coming to see us  Blood pressure is great  Follow up with Arrie SenateMandesia Hairston as directed  Lab Results  Component Value Date   HGBA1C 5.3 12/01/2017   Lab Results  Component Value Date   CHOL 148 12/01/2017   HDL 72 12/01/2017   LDLCALC 62 12/01/2017   TRIG 68 12/01/2017   CHOLHDL 2.1 12/01/2017   Lab Results  Component Value Date   TSH <0.006 (L) 12/01/2017   T4TOTAL 16.6 (H) 12/01/2017     Chemistry      Component Value Date/Time   NA 137 12/01/2017 1133   K 5.2 12/01/2017 1133   CL 104 12/01/2017 1133   CO2 17 (L) 12/01/2017 1133   BUN 12 12/01/2017 1133   CREATININE 0.40 (L) 12/01/2017 1133   CREATININE 0.35 (L) 11/21/2016 1427      Component Value Date/Time   CALCIUM 9.5 12/01/2017 1133   ALKPHOS 206 (H) 12/01/2017 1133   AST 19 12/01/2017 1133   ALT 22 12/01/2017 1133   BILITOT 0.3 12/01/2017 1133

## 2017-12-11 NOTE — Progress Notes (Signed)
   S:    Patient arrives in good spirits.   Presents to the clinic for hypertension evaluation. Maria Daniel [phone] was used for the entirety of the visit ([employer] intepreter).  Of note, patient is currently pregnant.  Patient denies adherence with medications. She is not taking any medication except for her prenatal vitamin due to fear of medication use on her baby. She reports that overall she feels very well with no symptoms of hyperthyroidism or hypertension.  Current BP Medications include:  Labetalol 100 mg daily (not taking)  Antihypertensives tried in the past include: metoprolol (stopped due to pregnancy)  O:   Last 3 Office BP readings: BP Readings from Last 3 Encounters:  12/11/17 125/81  12/01/17 (!) 144/81  03/31/17 127/79    BMET    Component Value Date/Time   NA 137 12/01/2017 1133   K 5.2 12/01/2017 1133   CL 104 12/01/2017 1133   CO2 17 (L) 12/01/2017 1133   GLUCOSE 90 12/01/2017 1133   GLUCOSE 89 11/21/2016 1427   BUN 12 12/01/2017 1133   CREATININE 0.40 (L) 12/01/2017 1133   CREATININE 0.35 (L) 11/21/2016 1427   CALCIUM 9.5 12/01/2017 1133   GFRNONAA 133 12/01/2017 1133   GFRAA 153 12/01/2017 1133    A/P: Hypertension long standing currently controlled off of medications. Patient to continue to hold labetalol and will follow up with OB/GYN and PCP. Also discussed other medications with her, to not take NSAIDs or any other medication without discussing it with OB/GYN.  Patient not symptomatic with hyperthyroidism. PTU is pregnancy risk factor D. Patient would like to discuss further with OB/GYN. Discussed with Dr. Hyman HopesJegede, patient will hold medication and follow up with OB/GYN.  Patient will only continue to take prenatal vitamin. Patient verbalized understanding.  Lab results from previous visit provided to patient.  Results reviewed and written information provided.   Total time in face-to-face counseling 15 minutes.   F/U Clinic Visit with PCP  Patient  seen with Enid DerryVictoria Mitchell, PharmD Candidate.

## 2017-12-16 ENCOUNTER — Other Ambulatory Visit: Payer: Self-pay | Admitting: Family Medicine

## 2017-12-22 ENCOUNTER — Other Ambulatory Visit: Payer: Self-pay | Admitting: Family Medicine

## 2017-12-22 ENCOUNTER — Other Ambulatory Visit (INDEPENDENT_AMBULATORY_CARE_PROVIDER_SITE_OTHER): Payer: Self-pay | Admitting: *Deleted

## 2017-12-22 ENCOUNTER — Telehealth (INDEPENDENT_AMBULATORY_CARE_PROVIDER_SITE_OTHER): Payer: Self-pay | Admitting: *Deleted

## 2017-12-22 DIAGNOSIS — E0580 Other thyrotoxicosis without thyrotoxic crisis or storm: Secondary | ICD-10-CM

## 2017-12-22 DIAGNOSIS — Z3201 Encounter for pregnancy test, result positive: Secondary | ICD-10-CM

## 2017-12-22 DIAGNOSIS — O99280 Endocrine, nutritional and metabolic diseases complicating pregnancy, unspecified trimester: Principal | ICD-10-CM

## 2017-12-22 MED ORDER — PRENATAL/FOLIC ACID PO TABS: 1.0000 | ORAL_TABLET | Freq: Every day | ORAL | 11 refills | Status: DC

## 2017-12-22 NOTE — Telephone Encounter (Signed)
Medical Assistant used [employer] to contact patient.  Interpreter Name: Ruel Favorsdgar Interpreter #: [phone] Patient is aware of thyroid being hyper and needing to take the medication as prescribed. Patient is aware of receiving a phone call within the next 3 weeks with appointment for the endocrinologist. Patient is also aware of diabetes being negative and cholesterol being normal. Patient would like to be advised regarding the medication.

## 2017-12-22 NOTE — Telephone Encounter (Signed)
Medication d/c. Referral placed (urgent) again for endocrinology and ob gyn to further address. If she does not have orange card she will need to apply for one.

## 2017-12-22 NOTE — Telephone Encounter (Signed)
-----   Message from Lizbeth BarkMandesia R Hairston, FNP sent at 12/09/2017  1:30 PM EST ----- Thyroid screening show hyperthyroidism. Take medication for hyperthyroidism. You will be referred to endocrinology.  Diabetes screening negative.  Cholesterol levels are normal.

## 2017-12-26 NOTE — Telephone Encounter (Signed)
Medical Assistant used [employer] to contact patient.  Interpreter Name: Renold DonLorenzo Interpreter #: [phone] Patient verified DOB Patient is aware of D/C medication and an urgent message being sent to endo and OB/GYN to address the need of these medications during her pregnancy. Patient has appointment with OB on 01/07/18 at 9:35 am Patient is in the work que for [facility]. Patient completed financial on 12/25/17. No further questions at this time.

## 2018-01-07 ENCOUNTER — Encounter: Payer: Self-pay | Admitting: Obstetrics & Gynecology

## 2018-01-07 ENCOUNTER — Telehealth: Payer: Self-pay | Admitting: Obstetrics & Gynecology

## 2018-01-07 NOTE — Telephone Encounter (Signed)
Called patient used interpreter. Spoke with patients husband and informed him that his wife had missed her appointment on 01/07/2018, Husband stated that she missed appointment because no one could watch their child(was informed of the flu restrictions)asked him to let her know if she could give us a call to reschedule.

## 2018-02-05 ENCOUNTER — Inpatient Hospital Stay (HOSPITAL_COMMUNITY)
Admission: AD | Admit: 2018-02-05 | Discharge: 2018-02-05 | Disposition: A | Discharge status: Home / Self Care | Payer: Self-pay | Source: Ambulatory Visit | Attending: Obstetrics & Gynecology | Admitting: Obstetrics & Gynecology

## 2018-02-05 ENCOUNTER — Encounter (HOSPITAL_COMMUNITY): Payer: Self-pay | Admitting: *Deleted

## 2018-02-05 ENCOUNTER — Encounter: Payer: Self-pay | Admitting: Internal Medicine

## 2018-02-05 DIAGNOSIS — O0932 Supervision of pregnancy with insufficient antenatal care, second trimester: Secondary | ICD-10-CM

## 2018-02-05 DIAGNOSIS — Z3A17 17 weeks gestation of pregnancy: Secondary | ICD-10-CM | POA: Insufficient documentation

## 2018-02-05 DIAGNOSIS — O98512 Other viral diseases complicating pregnancy, second trimester: Secondary | ICD-10-CM | POA: Insufficient documentation

## 2018-02-05 DIAGNOSIS — E0590 Thyrotoxicosis, unspecified without thyrotoxic crisis or storm: Secondary | ICD-10-CM | POA: Insufficient documentation

## 2018-02-05 DIAGNOSIS — R509 Fever, unspecified: Secondary | ICD-10-CM

## 2018-02-05 DIAGNOSIS — R5381 Other malaise: Secondary | ICD-10-CM

## 2018-02-05 DIAGNOSIS — O99282 Endocrine, nutritional and metabolic diseases complicating pregnancy, second trimester: Secondary | ICD-10-CM | POA: Insufficient documentation

## 2018-02-05 DIAGNOSIS — I10 Essential (primary) hypertension: Secondary | ICD-10-CM

## 2018-02-05 DIAGNOSIS — O162 Unspecified maternal hypertension, second trimester: Secondary | ICD-10-CM | POA: Insufficient documentation

## 2018-02-05 DIAGNOSIS — B349 Viral infection, unspecified: Secondary | ICD-10-CM | POA: Insufficient documentation

## 2018-02-05 HISTORY — DX: Thyrotoxicosis, unspecified without thyrotoxic crisis or storm: E05.90

## 2018-02-05 LAB — URINALYSIS, ROUTINE W REFLEX MICROSCOPIC
Bilirubin Urine: NEGATIVE
GLUCOSE, UA: NEGATIVE mg/dL
HGB URINE DIPSTICK: NEGATIVE
KETONES UR: NEGATIVE mg/dL
Leukocytes, UA: NEGATIVE
Nitrite: NEGATIVE
PH: 5 (ref 5.0–8.0)
PROTEIN: NEGATIVE mg/dL
Specific Gravity, Urine: 1.02 (ref 1.005–1.030)

## 2018-02-05 LAB — CBC
HEMATOCRIT: 37.2 % (ref 36.0–46.0)
Hemoglobin: 12.5 g/dL (ref 12.0–15.0)
MCH: 25.6 pg — AB (ref 26.0–34.0)
MCHC: 33.6 g/dL (ref 30.0–36.0)
MCV: 76.2 fL — AB (ref 78.0–100.0)
Platelets: 315 10*3/uL (ref 150–400)
RBC: 4.88 MIL/uL (ref 3.87–5.11)
RDW: 13.4 % (ref 11.5–15.5)
WBC: 11.9 10*3/uL — ABNORMAL HIGH (ref 4.0–10.5)

## 2018-02-05 LAB — COMPREHENSIVE METABOLIC PANEL
ALK PHOS: 116 U/L (ref 38–126)
ALT: 13 U/L — ABNORMAL LOW (ref 14–54)
ANION GAP: 10 (ref 5–15)
AST: 16 U/L (ref 15–41)
Albumin: 3.1 g/dL — ABNORMAL LOW (ref 3.5–5.0)
BILIRUBIN TOTAL: 0.8 mg/dL (ref 0.3–1.2)
BUN: 8 mg/dL (ref 6–20)
CALCIUM: 8.6 mg/dL — AB (ref 8.9–10.3)
CO2: 20 mmol/L — ABNORMAL LOW (ref 22–32)
Chloride: 105 mmol/L (ref 101–111)
Creatinine, Ser: 0.38 mg/dL — ABNORMAL LOW (ref 0.44–1.00)
Glucose, Bld: 85 mg/dL (ref 65–99)
POTASSIUM: 3.6 mmol/L (ref 3.5–5.1)
Sodium: 135 mmol/L (ref 135–145)
TOTAL PROTEIN: 7.3 g/dL (ref 6.5–8.1)

## 2018-02-05 LAB — INFLUENZA PANEL BY PCR (TYPE A & B)
INFLBPCR: NEGATIVE
Influenza A By PCR: NEGATIVE

## 2018-02-05 LAB — WET PREP, GENITAL
CLUE CELLS WET PREP: NONE SEEN
Sperm: NONE SEEN
TRICH WET PREP: NONE SEEN
YEAST WET PREP: NONE SEEN

## 2018-02-05 NOTE — Discharge Instructions (Signed)
Enfermedades virales en los adultos (Viral Illness, Adult) Los virus son microbios diminutos que entran en el organismo de Neomia Dear persona y Doctor, general practice. Hay muchos tipos de virus diferentes y causan muchas clases de enfermedades. Las enfermedades virales pueden ser leves o graves. Pueden afectar diferentes partes del cuerpo. Las enfermedades frecuentes causadas por virus incluyen los resfros y Emergency planning/management officer. Adems, las enfermedades virales abarcan cuadros clnicos graves, como el VIH/sida (virus de inmunodeficiencia humana/sndrome de inmunodeficiencia adquirida). Se han identificado unos pocos virus asociados con determinados tipos de cncer. CULES SON LAS CAUSAS? Muchos tipos de virus pueden causar enfermedades. Los virus invaden las clulas del organismo, se multiplican y Estate agent la disfuncin o la muerte de las clulas infectadas. Cuando la clula muere, libera ms virus. Cuando esto ocurre, aparecen sntomas de la enfermedad, y el virus sigue diseminndose a otras clulas. Si el virus asume la funcin de la clula, puede hacer que esta se divida y crezca fuera de control, y este es el caso en el que un virus causa cncer. Los diferentes virus ingresan al organismo de [employer]. Puede contraer un virus de la siguiente [place]:  Al ingerir alimentos o beber agua contaminados con el virus.  Al inhalar gotitas que una persona infectada liber en el aire al toser o estornudar.  Al tocar una superficie contaminada con el virus y [employer] mano a la boca, la nariz o los ojos.  Al ser picado por un insecto o mordido por un animal que son portadores del virus.  Al tener contacto sexual con Neomia Dear persona infectada por el virus.  Al tener contacto con sangre o lquidos que contienen el virus, ya sea a travs de un corte abierto o durante una transfusin. Si el virus ingresa al organismo, el sistema de defensa del cuerpo (sistema inmunitario) Product/process development scientist. Puede correr un riesgo  ms alto de tener una enfermedad viral si tiene el sistema inmunitario debilitado. CULES SON LOS SIGNOS O LOS SNTOMAS? Los sntomas varan en funcin del tipo de virus y de la ubicacin de las clulas que este invade. Los sntomas frecuentes de los principales tipos de enfermedades virales incluyen los siguientes: Virus del resfro y de la gripe  [place].  Dolor de [place].  Dolor de Advertising copywriter.  Dolores musculares.  Congestin nasal.  Tos. Virus del aparato digestivo (gastrointestinales)  Grant Ruts.  Dolor abdominal.  Nuseas.  Diarrea. Virus hepticos (hepatitis)  Prdida del apetito.  Cansancio.  Tono amarillento de la piel (ictericia). Virus del cerebro y la mdula espinal  [place].  Dolor de [place].  Rigidez en el cuello.  Nuseas y vmitos.  Confusin o somnolencia. Virus de la piel  [place].  Picazn.  Erupcin cutnea. Virus de transmisin sexual  Secrecin.  Hinchazn.  Enrojecimiento.  Erupcin cutnea. CMO SE TRATA ESTA AFECCIN? Los virus pueden ser difciles de tratar porque se hospedan en las clulas. Los antibiticos no tratan los virus porque no llegan al interior de las clulas. El tratamiento de una enfermedad viral puede incluir lo siguiente:  Lawyer y beber abundantes lquidos.  Medicamentos para [employer]. Estos pueden incluir medicamentos de venta libre para Chief Technology Officer y la [place], medicamentos para la tos o la congestin y medicamentos para Actuary.  Medicamentos antivirales. Estos frmacos estn disponibles nicamente para determinados tipos de virus. Pueden ayudar a [employer] de la gripe, si se toman apenas comienza el cuadro. Tambin hay antivirales para la hepatitis y el VIH/sida. Algunas enfermedades virales pueden evitarse con vacunas.  Un ejemplo frecuente es la vacuna antigripal. SIGA ESTAS INDICACIONES EN SU CASA: Medicamentos  Tome los medicamentos de venta libre y los recetados  solamente como se lo haya indicado el mdico.  Si le recetaron un antiviral, tmelo como se lo haya indicado el mdico. No deje de tomar los medicamentos aunque comience a Actor.  Infrmese sobre cundo los antibiticos son necesarios y cundo no. Los antibiticos no combaten a los virus. Si el mdico cree que usted puede tener una infeccin bacteriana as como una viral, tal vez le receten un antibitico. ? No solicite una receta de antibiticos si le han diagnosticado una enfermedad viral. Eso no har que la enfermedad pase ms rpidamente. ? Tomar antibiticos con frecuencia cuando no son necesarios puede derivar en resistencia a los antibiticos. Cuando esto ocurre, el medicamento pierde su eficacia contra las bacterias que normalmente combate. Instrucciones generales  Beba suficiente lquido para mantener la orina clara o de color amarillo plido.  Descanse todo lo que pueda.  Retome sus actividades normales como se lo haya indicado el mdico. Pregntele al mdico qu actividades son seguras para usted.  Concurra a todas las visitas de control como se lo haya indicado el mdico. Esto es importante. CMO SE EVITA ESTO? Tome estas medidas para reducir el riesgo de tener una infeccin viral:  Consuma una dieta sana y descanse mucho.  Lvese las manos frecuentemente con agua y [place]. Esto es especialmente importante cuando est en lugares pblicos. Use desinfectante para manos si no dispone de [place] y [place].  Evite el contacto cercano con amigos y familiares que tengan una infeccin viral.  Si viaja a las regiones donde las infecciones virales gastrointestinales son frecuentes, no tome agua ni coma alimentos crudos.  Mantngase al da con las vacunas. Vacnese contra la gripe todos los aos como se lo haya indicado el mdico.  No comparta cepillos de dientes, cortaas, rasuradoras o agujas con [employer].  Siempre tenga sexo con proteccin. COMUNQUESE CON UN MDICO  SI:  Tiene sntomas de una enfermedad viral que no desaparecen.  Los sntomas regresan despus de haber desaparecido.  Los sntomas empeoran. SOLICITE AYUDA DE INMEDIATO SI:  Tiene dificultad para respirar.  Siente un dolor de cabeza intenso o rigidez en el cuello.  Tiene vmitos fuertes o dolor abdominal. Esta informacin no tiene Theme park manager el consejo del mdico. Asegrese de hacerle al mdico cualquier pregunta que tenga. Document Released: 07/18/2016 Document Revised: 07/18/2016 Document Reviewed: 03/22/2016 Elsevier Interactive Patient Education  2018 [employer]. Hipertensin durante el embarazo Hypertension During Pregnancy La hipertensin tambin se denomina presin arterial alta. La presin arterial alta significa que la sangre se mueve por el cuerpo con mucha fuerza. Cuando est embarazada, esta afeccin se debe controlar cuidadosamente. Puede causar problemas para usted y su beb. Siga estas indicaciones en su casa: Qu debe comer y beber  Beba suficiente lquido para mantener el pis (orina) claro o de color amarillo plido.  Coma alimentos con bajo contenido de sal (sodio). ? No agregue sal a las comidas. ? Revise las etiquetas de los alimentos y las bebidas para saber la cantidad de sal que contienen. En la etiqueta, busque el trmino "Sodio". Estilo de vida  No consuma ningn producto que contenga nicotina o tabaco, como cigarrillos y Administrator, Civil Service. Si necesita ayuda para dejar de fumar, consulte al mdico.  No consuma alcohol.  Evite la cafena.  Evite el estrs. Haga reposo y [place]. Instrucciones generales  [employer] de venta  libre y los Science writerrecetados solamente como se lo haya indicado el mdico.  Acustese sobre el lado izquierdo mientras hace reposo, de Baldwineste modo, mantiene la presin de su cuerpo alejada del beb.  Alce (eleve) las piernas mientras est sentada o recostada. Intente colocar algunas almohadas debajo de las  pantorrillas.  Haga ejercicios regularmente. Pregntele al mdico qu tipos de ejercicios son mejores para usted.  Concurra a todas las visitas de atencin prenatal y de seguimiento como se lo haya indicado el mdico. Esto es importante. Comunquese con un mdico si:  Tiene sntomas a los que su mdico le indic que debera estar atenta, tales como: ? Grant RutsFiebre. ? [place] (vmitos). ? Dolor de [place]. Solicite ayuda de inmediato si:  Tiene dolor muy intenso en el vientre (abdomen).  Tiene vmitos que no mejoran con tratamiento.  Presenta hinchazn repentina en las manos, los tobillos o el rostro.  Aumenta ms de 4libras (1,8kg) en una semana.  Tiene una hemorragia vaginal.  Maxie Betterbserva sangre en la [place].  No siente al beb moverse tanto como lo hace normalmente.  Tiene algn cambio en la visin.  Tiene calambres o espasmos musculares repentinos.  Tiene dificultad para respirar.  Sus labios o uas comienzan a ponerse azules. Esta informacin no tiene Theme park managercomo fin reemplazar el consejo del mdico. Asegrese de hacerle al mdico cualquier pregunta que tenga. Document Released: 02/26/2011 Document Revised: 03/05/2017 Document Reviewed: 04/26/2016 Elsevier Interactive Patient Education  [employer]2018 Elsevier Inc.

## 2018-02-05 NOTE — MAU Provider Note (Signed)
Chief Complaint:  Abdominal Pain   First Provider Initiated Contact with Patient 02/05/18 2130     HPI: Maria Daniel is a 37 y.o. [id number] at 46w6dwho presents to maternity admissions reporting fever yesterday. Had chills once.  No fever today.  Some nasal congestion.  Some lower abdominal pain. . She reports good fetal movement, denies LOF, vaginal bleeding, vaginal itching/burning, urinary symptoms, h/a, dizziness, n/v, diarrhea, constipation or fever/chills.  She denies headache, visual changes or RUQ abdominal pain.  Abdominal Pain  This is a new problem. The current episode started in the past 7 days. The onset quality is gradual. The problem occurs intermittently. The problem has been unchanged. The pain is located in the generalized abdominal region. The pain is mild. The quality of the pain is cramping and colicky. The abdominal pain does not radiate. Associated symptoms include a fever. Pertinent negatives include no anorexia, constipation, diarrhea, dysuria, frequency, headaches, myalgias, nausea or vomiting. Nothing aggravates the pain. The pain is relieved by nothing. She has tried nothing for the symptoms.    RN note: Pt stated she thinks she is pregnant has not had period since November. Had a Positive and negative and positive  pregnacy test in December. [facility] Has not started care yet. Started having a fever 103.4 and abd pain last night. Denies any nausea or vomiting.     Past Medical History: Past Medical History:  Diagnosis Date  . Complication of anesthesia    had trouble with spinal not working with last cs does not want a learner to do spinal  . Diabetes mellitus without complication (HCC)   . Dyspnea   . Gestational diabetes   . Hypertension    no meds patient denies  . Hyperthyroidism   . Insomnia   . Knee pain   . Miscarriage     Past obstetric history: OB History  Gravida Para Term Preterm AB Living  6 3 3   2  3   SAB TAB Ectopic Multiple Live Births  2     0 3    # Outcome Date GA Lbr Len/2nd Weight Sex Delivery Anes PTL Lv  6 Current           5 Term 04/23/16 [redacted]w[redacted]d  8 lb 15.2 oz (4.06 kg) M CS-LTranv Spinal  LIV  4 SAB 05/2014             Birth Comments: System Generated. Please review and update pregnancy details.  3 Term 08/17/12 [redacted]w[redacted]d   M CS-LTranv Other  LIV  2 SAB 11/2002          1 Term 06/25/02 [redacted]w[redacted]d 20:00 6 lb 8 oz (2.948 kg) M CS-Unspec EPI  LIV     Birth Comments: arrest of dilation      Past Surgical History: Past Surgical History:  Procedure Laterality Date  . CESAREAN SECTION    . CESAREAN SECTION  08/17/2012   Procedure: CESAREAN SECTION;  Surgeon: Lesly Dukes, MD;  Location: [facility];  Service: Obstetrics;  Laterality: N/A;  . CESAREAN SECTION N/A 04/23/2016   Procedure: CESAREAN SECTION;  Surgeon: Catalina Antigua, MD;  Location: [facility];  Service: Obstetrics;  Laterality: N/A;  . LESION REMOVAL N/A 11/11/2016   Procedure: EXCISION OF SUBCUTANEOUS LESION/ ABDOMEN;  Surgeon: Hermina Staggers, MD;  Location: [facility];  Service: Gynecology;  Laterality: N/A;    Family History: Family History  Problem Relation Age of Onset  . Hypertension Mother   .  Kidney disease Father   . Multiple sclerosis Father     Social History: Social History   Tobacco Use  . Smoking status: Never Smoker  . Smokeless tobacco: Never Used  Substance Use Topics  . Alcohol use: No    Comment: socially  . Drug use: No    Allergies: No Known Allergies  Meds:  Medications Prior to Admission  Medication Sig Dispense Refill Last Dose  . Prenatal Vit-Fe Fumarate-FA (PRENATAL/FOLIC ACID) TABS Take 1 tablet by mouth daily. 30 each 11 02/05/2018 at Unknown time  . ketorolac (TORADOL) 10 MG tablet TAKE ONE TABLET BY MOUTH EVERY 6 HOURS AS NEEDED 20 tablet 0   . labetalol (NORMODYNE) 100 MG tablet Take 1 tablet (100 mg total) by mouth daily. 30 tablet 2   . naproxen (NAPROSYN) 500 MG  tablet Take 1 tablet (500 mg total) by mouth 2 (two) times daily with a meal. 30 tablet 0     I have reviewed patient's Past Medical Hx, Surgical Hx, Family Hx, Social Hx, medications and allergies.   ROS:  Review of Systems  Constitutional: Positive for fever.  Gastrointestinal: Positive for abdominal pain. Negative for anorexia, constipation, diarrhea, nausea and vomiting.  Genitourinary: Negative for dysuria and frequency.  Musculoskeletal: Negative for myalgias.  Neurological: Negative for headaches.   Other systems negative  Physical Exam   Patient Vitals for the past 24 hrs:  BP Temp Temp src Pulse Resp Height Weight  02/05/18 2130 (!) 149/85 98.3 F (36.8 C) Oral 68 - - -  02/05/18 1815 (!) 152/76 98.4 F (36.9 C) - 75 18 5\' 4"  (1.626 m) 197 lb (89.4 kg)   Constitutional: Well-developed, well-nourished female in no acute distress.  Cardiovascular: normal rate and rhythm Respiratory: normal effort, clear to auscultation bilaterally GI: Abd soft, non-tender, gravid appropriate for gestational age.   No rebound or guarding. MS: Extremities nontender, no edema, normal ROM Neurologic: Alert and oriented x 4.  GU: Neg CVAT.  PELVIC EXAM:  cervix long and closed. No bleeding  FHT:  150  Labs: Results for orders placed or performed during the hospital encounter of 02/05/18 (from the past 24 hour(s))  Urinalysis, Routine w reflex microscopic     Status: None   Collection Time: 02/05/18  6:13 PM  Result Value Ref Range   Color, Urine YELLOW YELLOW   APPearance CLEAR CLEAR   Specific Gravity, Urine 1.020 1.005 - 1.030   pH 5.0 5.0 - 8.0   Glucose, UA NEGATIVE NEGATIVE mg/dL   Hgb urine dipstick NEGATIVE NEGATIVE   Bilirubin Urine NEGATIVE NEGATIVE   Ketones, ur NEGATIVE NEGATIVE mg/dL   Protein, ur NEGATIVE NEGATIVE mg/dL   Nitrite NEGATIVE NEGATIVE   Leukocytes, UA NEGATIVE NEGATIVE  Wet prep, genital     Status: Abnormal   Collection Time: 02/05/18  9:39 PM  Result  Value Ref Range   Yeast Wet Prep HPF POC NONE SEEN NONE SEEN   Trich, Wet Prep NONE SEEN NONE SEEN   Clue Cells Wet Prep HPF POC NONE SEEN NONE SEEN   WBC, Wet Prep HPF POC FEW (A) NONE SEEN   Sperm NONE SEEN   Influenza panel by PCR (type A & B)     Status: None   Collection Time: 02/05/18  9:44 PM  Result Value Ref Range   Influenza A By PCR NEGATIVE NEGATIVE   Influenza B By PCR NEGATIVE NEGATIVE  CBC     Status: Abnormal   Collection Time: 02/05/18 10:23  PM  Result Value Ref Range   WBC 11.9 (H) 4.0 - 10.5 K/uL   RBC 4.88 3.87 - 5.11 MIL/uL   Hemoglobin 12.5 12.0 - 15.0 g/dL   HCT 69.637.2 29.536.0 - 28.446.0 %   MCV 76.2 (L) 78.0 - 100.0 fL   MCH 25.6 (L) 26.0 - 34.0 pg   MCHC 33.6 30.0 - 36.0 g/dL   RDW 13.213.4 44.011.5 - 10.215.5 %   Platelets 315 150 - 400 K/uL  Comprehensive metabolic panel     Status: Abnormal   Collection Time: 02/05/18 10:23 PM  Result Value Ref Range   Sodium 135 135 - 145 mmol/L   Potassium 3.6 3.5 - 5.1 mmol/L   Chloride 105 101 - 111 mmol/L   CO2 20 (L) 22 - 32 mmol/L   Glucose, Bld 85 65 - 99 mg/dL   BUN 8 6 - 20 mg/dL   Creatinine, Ser 7.250.38 (L) 0.44 - 1.00 mg/dL   Calcium 8.6 (L) 8.9 - 10.3 mg/dL   Total Protein 7.3 6.5 - 8.1 g/dL   Albumin 3.1 (L) 3.5 - 5.0 g/dL   AST 16 15 - 41 U/L   ALT 13 (L) 14 - 54 U/L   Alkaline Phosphatase 116 38 - 126 U/L   Total Bilirubin 0.8 0.3 - 1.2 mg/dL   GFR calc non Af Amer >60 >60 mL/min   GFR calc Af Amer >60 >60 mL/min   Anion gap 10 5 - 15       Imaging:  Informal bedside US.  FL and HL c/w about 17 weeks but optimal measurements were not made  MAU Course/MDM: I have ordered labs and reviewed results. PIH labs normal   Flu tests negative.  UA clear.    Discussed hypertension. Pt stopped her meds for HTN and Hyperthyroidism Treatments in MAU included none.    Assessment: Intrauterine pregnancy at 3820w0d Viral illness, improved Hypertension, chronic Hyperthyroidism  Plan: Discharge home Supportive  care Recommend restart Labetalol Will consult [facility].  Follow up in Office for prenatal visits and recheck  Encouraged to return here or to other Urgent Care/ED if she develops worsening of symptoms, increase in pain, fever, or other concerning symptoms.   Pt stable at time of discharge.  Wynelle BourgeoisMarie Jesson Foskey CNM, MSN Certified Nurse-Midwife 02/05/2018 9:53 PM

## 2018-02-05 NOTE — MAU Note (Signed)
Pt stated she thinks she is pregnant has not had period since November. Had a Positive and negative and positive  pregnacy test in December. [facility]. Has not started care yet. Started having a fever 103.4 and abd pain last night. Denies any nausea or vomiting.

## 2018-02-06 LAB — GC/CHLAMYDIA PROBE AMP (~~LOC~~) NOT AT ARMC
CHLAMYDIA, DNA PROBE: NEGATIVE
NEISSERIA GONORRHEA: NEGATIVE

## 2018-02-25 ENCOUNTER — Encounter: Payer: Self-pay | Admitting: Nurse Practitioner

## 2018-02-26 ENCOUNTER — Encounter: Payer: Self-pay | Admitting: Certified Nurse Midwife

## 2018-03-25 ENCOUNTER — Encounter: Payer: Self-pay | Admitting: Obstetrics and Gynecology

## 2018-03-25 ENCOUNTER — Ambulatory Visit (INDEPENDENT_AMBULATORY_CARE_PROVIDER_SITE_OTHER): Payer: Self-pay | Admitting: Obstetrics and Gynecology

## 2018-03-25 VITALS — BP 120/80 | HR 77 | Wt 213.8 lb

## 2018-03-25 DIAGNOSIS — O09891 Supervision of other high risk pregnancies, first trimester: Secondary | ICD-10-CM

## 2018-03-25 DIAGNOSIS — O0992 Supervision of high risk pregnancy, unspecified, second trimester: Secondary | ICD-10-CM

## 2018-03-25 DIAGNOSIS — Z9114 Patient's other noncompliance with medication regimen: Secondary | ICD-10-CM

## 2018-03-25 DIAGNOSIS — Z789 Other specified health status: Secondary | ICD-10-CM

## 2018-03-25 DIAGNOSIS — I10 Essential (primary) hypertension: Secondary | ICD-10-CM

## 2018-03-25 DIAGNOSIS — O09292 Supervision of pregnancy with other poor reproductive or obstetric history, second trimester: Secondary | ICD-10-CM

## 2018-03-25 DIAGNOSIS — O0993 Supervision of high risk pregnancy, unspecified, third trimester: Secondary | ICD-10-CM | POA: Insufficient documentation

## 2018-03-25 DIAGNOSIS — Z8632 Personal history of gestational diabetes: Secondary | ICD-10-CM

## 2018-03-25 DIAGNOSIS — Z98891 History of uterine scar from previous surgery: Secondary | ICD-10-CM

## 2018-03-25 DIAGNOSIS — E0590 Thyrotoxicosis, unspecified without thyrotoxic crisis or storm: Secondary | ICD-10-CM

## 2018-03-25 DIAGNOSIS — O09892 Supervision of other high risk pregnancies, second trimester: Secondary | ICD-10-CM

## 2018-03-25 DIAGNOSIS — O09299 Supervision of pregnancy with other poor reproductive or obstetric history, unspecified trimester: Secondary | ICD-10-CM | POA: Insufficient documentation

## 2018-03-25 DIAGNOSIS — Z3480 Encounter for supervision of other normal pregnancy, unspecified trimester: Secondary | ICD-10-CM

## 2018-03-25 LAB — POCT URINALYSIS DIP (DEVICE)
Glucose, UA: NEGATIVE mg/dL
Hgb urine dipstick: NEGATIVE
Leukocytes, UA: NEGATIVE
Nitrite: NEGATIVE
Protein, ur: 30 mg/dL — AB
Specific Gravity, Urine: 1.025 (ref 1.005–1.030)
Urobilinogen, UA: 0.2 mg/dL (ref 0.0–1.0)
pH: 6 (ref 5.0–8.0)

## 2018-03-25 MED ORDER — ASPIRIN EC 81 MG PO TBEC
81.0000 mg | DELAYED_RELEASE_TABLET | Freq: Every day | ORAL | 2 refills | Status: DC
Start: 2018-03-25 — End: 2018-07-06

## 2018-03-25 NOTE — Progress Notes (Signed)
[employer] interpreter Gunnar Fusi [phone]

## 2018-03-25 NOTE — Progress Notes (Signed)
INITIAL PRENATAL VISIT NOTE  Subjective:  Maria Daniel is a 37 y.o. [id number] at 51w5dby informal 17 week bedside [place] [facility] being seen today for her initial prenatal visit. This is an unplanned pregnancy. She was using nothing for birth control previously, did not think she could get pregnant as she was breast-feeding. She has an obstetric history significant for 3 x CS with last CS complicated by wound infection requiring a PICO and subsequent excision of several painful necrotic lipomas after c-section. She has a medical history significant for gestational DM, chronic HTN, gestational HTN.  Patient reports she fell last night and has cramping in legs, dysuria. Baby has not been moving much during the pregnancy yet, fell on her hip and having some pain from that, denies any abdominal trauma. Reports she felt a lot of movement just after fall, and now baby is back to moving less similar to before fall.  Contractions: Not present. Vag. Bleeding: None.  Movement: Present. Denies leaking of fluid.   Past Medical History:  Diagnosis Date  . Complication of anesthesia    had trouble with spinal not working with last cs does not want a learner to do spinal  . [place]   . Dyspnea   . Gestational diabetes   . Hypertension    no meds patient denies  . Hyperthyroidism   . Insomnia   . Knee pain   . Miscarriage     Past Surgical History:  Procedure Laterality Date  . CESAREAN SECTION    . CESAREAN SECTION  08/17/2012   Procedure: CESAREAN SECTION;  Surgeon: KGuss Bunde MD;  [facility];  Service: Obstetrics;  Laterality: N/A;  . CESAREAN SECTION N/A 04/23/2016   Procedure: CESAREAN SECTION;  Surgeon: PMora Bellman MD;  [facility]  Service: Obstetrics;  Laterality: N/A;  . LESION REMOVAL N/A 11/11/2016   Procedure: EXCISION OF SUBCUTANEOUS LESION/ ABDOMEN;  Surgeon: MChancy Milroy MD;  [place] [facility];  Service:  Gynecology;  Laterality: N/A;    OB History  Gravida Para Term Preterm AB Living  6 3 3   2 3   SAB TAB Ectopic Multiple Live Births  2     0 3    # Outcome Date GA Lbr Len/2nd Weight Sex Delivery Anes PTL Lv  6 Gravida           5 Term 04/23/16 337w3d8 lb 15.2 oz (4.06 kg) M CS-LTranv Spinal  LIV  4 SAB 05/2014             Birth Comments: System Generated. Please review and update pregnancy details.  3 Term 08/17/12 3934w0dM CS-LTranv Other  LIV  2 SAB 11/2002          1 Term 06/25/02 [id number] 6 lb 8 oz (2.948 kg) M CS-Unspec EPI  LIV     Birth Comments: arrest of dilation    Social History   Socioeconomic History  . Marital status: Significant Other    Spouse name: Not on file  . Number of children: Not on file  . Years of education: Not on file  . Highest education level: Not on file  Occupational History  . Not on file  Social Needs  . Financial resource strain: Not on file  . Food insecurity:    Worry: Not on file    Inability: Not on file  . Transportation needs:    Medical: Not on  file    Non-medical: Not on file  Tobacco Use  . Smoking status: Never Smoker  . Smokeless tobacco: Never Used  Substance and Sexual Activity  . Alcohol use: No    Comment: socially  . Drug use: No  . Sexual activity: Yes    Birth control/protection: None  Lifestyle  . Physical activity:    Days per week: Not on file    Minutes per session: Not on file  . Stress: Not on file  Relationships  . Social connections:    Talks on phone: Not on file    Gets together: Not on file    Attends religious service: Not on file    Active member of club or organization: Not on file    Attends meetings of clubs or organizations: Not on file    Relationship status: Not on file  Other Topics Concern  . Not on file  Social History Narrative  . Not on file    Family History  Problem Relation Age of Onset  . Hypertension Mother   . Kidney disease Father   . Multiple sclerosis  Father     (Not in a hospital admission)  No Known Allergies  Review of Systems: Negative except for what is mentioned in HPI.  Objective:   Vitals:   03/25/18 1055  BP: 120/80  Pulse: 77  Weight: 213 lb 12.8 oz (97 kg)   Fetal Status: Fetal Heart Rate (bpm): 151   Movement: Present     Physical Exam: BP 120/80   Pulse 77   Wt 213 lb 12.8 oz (97 kg)   LMP 10/10/2017   Breastfeeding? Unknown   BMI 36.70 kg/m  CONSTITUTIONAL: Well-developed, well-nourished female in no acute distress.  NEUROLOGIC: Alert and oriented to person, place, and time. Normal reflexes, muscle tone coordination. No cranial nerve deficit noted. PSYCHIATRIC: Normal mood and affect. Normal behavior. Normal judgment and thought content. SKIN: Skin is warm and dry. No rash noted. Not diaphoretic. No erythema. No pallor. HENT:  Normocephalic, atraumatic, External right and left ear normal. Oropharynx is clear and moist EYES: Conjunctivae and EOM are normal. Pupils are equal, round, and reactive to light. No scleral icterus.  NECK: Normal range of motion, supple, no masses CARDIOVASCULAR: Normal heart rate noted, regular rhythm RESPIRATORY: Effort normal, no problems with respiration noted BREASTS: symmetric, non-tender, no masses palpable ABDOMEN: Soft, nontender, nondistended, difficult to palpate uterus secondary to habitus, well healed pfannenstiel with small vertical midline incision well healed superior to the pfannenstiel GU: declines pelvic MUSCULOSKELETAL: Normal range of motion. EXT:  No edema and no tenderness. 2+ distal pulses.   Assessment and Plan:  Pregnancy: [id number] at 1w5dby informal bedside 17 week [place] 1. H/O gestational diabetes in prior pregnancy, currently pregnant - HgB A1c - will need 28 week GTT  2. Supervision of high risk pregnancy in second trimester - Culture, OB Urine - Cystic fibrosis gene test - Obstetric Panel, Including HIV - SMN1 COPY NUMBER ANALYSIS (SMA Carrier  Screen) - Comp Met (CMET) - Protein / creatinine ratio, urine - T4, free - TSH - T3 - [place]FM OB DETAIL +14 WK; Future  3. Hyperthyroidism Previously on medication, felt "better" so stopped taking it in 10/2017, was told by PCP to continue but did not like the way it made her feel so she did not continue  Dx 11/2016 Asymptomatic today Repeat TSH/T4/T3  4. Essential hypertension Previously on labetalol 100 mg BID, stopped last summer Not currently  on any meds Stable BP Reviewed baby ASA, ordered  5. Language barrier - Patent attorney used  6. H/o c-section CS x3 Will need repeat   Preterm labor symptoms and general obstetric precautions including but not limited to vaginal bleeding, contractions, leaking of fluid and fetal movement were reviewed in detail with the patient.  Please refer to After Visit Summary for other counseling recommendations.   Return in about 2 weeks (around 04/08/2018) for OB visit (MD).  Sloan Leiter 03/25/2018 12:09 PM

## 2018-03-26 LAB — PROTEIN / CREATININE RATIO, URINE
CREATININE, UR: 200.6 mg/dL
Protein, Ur: 28.7 mg/dL
Protein/Creat Ratio: 143 mg/g creat (ref 0–200)

## 2018-03-28 LAB — URINE CULTURE, OB REFLEX

## 2018-03-28 LAB — CULTURE, OB URINE

## 2018-04-01 LAB — HEMOGLOBIN A1C
ESTIMATED AVERAGE GLUCOSE: 111 mg/dL
Hgb A1c MFr Bld: 5.5 % (ref 4.8–5.6)

## 2018-04-01 LAB — COMPREHENSIVE METABOLIC PANEL
ALBUMIN: 3.8 g/dL (ref 3.5–5.5)
ALT: 10 IU/L (ref 0–32)
AST: 15 IU/L (ref 0–40)
Albumin/Globulin Ratio: 1.1 — ABNORMAL LOW (ref 1.2–2.2)
Alkaline Phosphatase: 107 IU/L (ref 39–117)
BUN / CREAT RATIO: 16 (ref 9–23)
BUN: 6 mg/dL (ref 6–20)
Bilirubin Total: 0.3 mg/dL (ref 0.0–1.2)
CALCIUM: 8.5 mg/dL — AB (ref 8.7–10.2)
CO2: 20 mmol/L (ref 20–29)
CREATININE: 0.38 mg/dL — AB (ref 0.57–1.00)
Chloride: 103 mmol/L (ref 96–106)
GFR calc Af Amer: 155 mL/min/{1.73_m2} (ref >59)
GFR, EST NON AFRICAN AMERICAN: 135 mL/min/{1.73_m2} (ref >59)
GLOBULIN, TOTAL: 3.5 g/dL (ref 1.5–4.5)
Glucose: 83 mg/dL (ref 65–99)
Potassium: 4.4 mmol/L (ref 3.5–5.2)
SODIUM: 138 mmol/L (ref 134–144)
TOTAL PROTEIN: 7.3 g/dL (ref 6.0–8.5)

## 2018-04-01 LAB — OBSTETRIC PANEL, INCLUDING HIV
ANTIBODY SCREEN: NEGATIVE
BASOS: 0 %
Basophils Absolute: 0 10*3/uL (ref 0.0–0.2)
EOS (ABSOLUTE): 0.1 10*3/uL (ref 0.0–0.4)
Eos: 1 %
HEMATOCRIT: 37.8 % (ref 34.0–46.6)
HIV SCREEN 4TH GENERATION: NONREACTIVE
Hemoglobin: 12.5 g/dL (ref 11.1–15.9)
Hepatitis B Surface Ag: NEGATIVE
Immature Grans (Abs): 0.1 10*3/uL (ref 0.0–0.1)
Immature Granulocytes: 1 %
LYMPHS ABS: 2.4 10*3/uL (ref 0.7–3.1)
Lymphs: 23 %
MCH: 26 pg — AB (ref 26.6–33.0)
MCHC: 33.1 g/dL (ref 31.5–35.7)
MCV: 79 fL (ref 79–97)
MONOCYTES: 5 %
Monocytes Absolute: 0.5 10*3/uL (ref 0.1–0.9)
NEUTROS ABS: 7.3 10*3/uL — AB (ref 1.4–7.0)
Neutrophils: 70 %
Platelets: 329 10*3/uL (ref 150–379)
RBC: 4.8 x10E6/uL (ref 3.77–5.28)
RDW: 14.7 % (ref 12.3–15.4)
RPR Ser Ql: NONREACTIVE
RUBELLA: 29.1 {index} (ref >0.99)
Rh Factor: POSITIVE
WBC: 10.4 10*3/uL (ref 3.4–10.8)

## 2018-04-01 LAB — SMN1 COPY NUMBER ANALYSIS (SMA CARRIER SCREENING)

## 2018-04-01 LAB — CYSTIC FIBROSIS GENE TEST

## 2018-04-01 LAB — T4, FREE: Free T4: 1.51 ng/dL (ref 0.82–1.77)

## 2018-04-01 LAB — TSH

## 2018-04-01 LAB — T3: T3, Total: 319 ng/dL — ABNORMAL HIGH (ref 71–180)

## 2018-04-03 ENCOUNTER — Ambulatory Visit (HOSPITAL_COMMUNITY)
Admission: RE | Admit: 2018-04-03 | Discharge: 2018-04-03 | Disposition: A | Discharge status: Home / Self Care | Payer: No Typology Code available for payment source | Source: Ambulatory Visit | Attending: Obstetrics and Gynecology | Admitting: Obstetrics and Gynecology

## 2018-04-03 ENCOUNTER — Other Ambulatory Visit: Payer: Self-pay | Admitting: Obstetrics and Gynecology

## 2018-04-03 ENCOUNTER — Ambulatory Visit (HOSPITAL_COMMUNITY): Payer: Self-pay

## 2018-04-03 ENCOUNTER — Encounter (HOSPITAL_COMMUNITY): Payer: Self-pay

## 2018-04-03 DIAGNOSIS — Z3492 Encounter for supervision of normal pregnancy, unspecified, second trimester: Secondary | ICD-10-CM

## 2018-04-03 DIAGNOSIS — O09299 Supervision of pregnancy with other poor reproductive or obstetric history, unspecified trimester: Secondary | ICD-10-CM

## 2018-04-03 DIAGNOSIS — O99212 Obesity complicating pregnancy, second trimester: Secondary | ICD-10-CM

## 2018-04-03 DIAGNOSIS — Z3687 Encounter for antenatal screening for uncertain dates: Secondary | ICD-10-CM | POA: Insufficient documentation

## 2018-04-03 DIAGNOSIS — Z363 Encounter for antenatal screening for malformations: Secondary | ICD-10-CM

## 2018-04-03 DIAGNOSIS — O162 Unspecified maternal hypertension, second trimester: Secondary | ICD-10-CM

## 2018-04-03 DIAGNOSIS — O0992 Supervision of high risk pregnancy, unspecified, second trimester: Secondary | ICD-10-CM

## 2018-04-03 DIAGNOSIS — Z3A22 22 weeks gestation of pregnancy: Secondary | ICD-10-CM

## 2018-04-03 DIAGNOSIS — Z8632 Personal history of gestational diabetes: Secondary | ICD-10-CM

## 2018-04-03 DIAGNOSIS — O34219 Maternal care for unspecified type scar from previous cesarean delivery: Secondary | ICD-10-CM

## 2018-04-03 DIAGNOSIS — O10012 Pre-existing essential hypertension complicating pregnancy, second trimester: Secondary | ICD-10-CM | POA: Insufficient documentation

## 2018-04-03 DIAGNOSIS — O09292 Supervision of pregnancy with other poor reproductive or obstetric history, second trimester: Secondary | ICD-10-CM | POA: Insufficient documentation

## 2018-04-04 MED ORDER — METHIMAZOLE 5 MG PO TABS: 5.0000 mg | ORAL_TABLET | Freq: Two times a day (BID) | ORAL | 1 refills | Status: DC

## 2018-04-04 NOTE — Addendum Note (Signed)
Addended by: Leroy Libman on: 04/04/2018 09:23 AM   Modules accepted: Orders

## 2018-04-13 ENCOUNTER — Encounter: Payer: Self-pay | Admitting: Obstetrics and Gynecology

## 2018-04-17 ENCOUNTER — Telehealth: Payer: Self-pay

## 2018-04-17 NOTE — Telephone Encounter (Addendum)
-----   Message from Conan Bowens, MD sent at 04/04/2018  9:18 AM EDT ----- Please call patient, have start on Methimazole 5 mg BID, please get her set up with endocrine, I have placed referral.  Called pt with spanish interpreter Mariel and informed pt that the medication has been sent to her [facility] pharmacy on [address] And that I will have her referral appt to Endocrine at her next appt. Pt stated that she will not be able to come to the on 04/27/18 @ 1115 but she could if it is after 3pm.  I informed pt that she come on June 3rd @ 1700 for OB visit.  Pt stated "thank you so much" and did not have any other questions.

## 2018-04-27 ENCOUNTER — Ambulatory Visit (INDEPENDENT_AMBULATORY_CARE_PROVIDER_SITE_OTHER): Payer: No Typology Code available for payment source | Admitting: Obstetrics and Gynecology

## 2018-04-27 ENCOUNTER — Encounter: Payer: No Typology Code available for payment source | Admitting: Obstetrics and Gynecology

## 2018-04-27 ENCOUNTER — Encounter: Payer: Self-pay | Admitting: Obstetrics and Gynecology

## 2018-04-27 ENCOUNTER — Encounter: Payer: No Typology Code available for payment source | Admitting: Family Medicine

## 2018-04-27 VITALS — BP 120/59 | HR 72 | Wt 227.5 lb

## 2018-04-27 DIAGNOSIS — Z3480 Encounter for supervision of other normal pregnancy, unspecified trimester: Secondary | ICD-10-CM

## 2018-04-27 DIAGNOSIS — Z98891 History of uterine scar from previous surgery: Secondary | ICD-10-CM

## 2018-04-27 DIAGNOSIS — Z789 Other specified health status: Secondary | ICD-10-CM

## 2018-04-27 DIAGNOSIS — O09299 Supervision of pregnancy with other poor reproductive or obstetric history, unspecified trimester: Secondary | ICD-10-CM

## 2018-04-27 DIAGNOSIS — E0590 Thyrotoxicosis, unspecified without thyrotoxic crisis or storm: Secondary | ICD-10-CM

## 2018-04-27 DIAGNOSIS — R635 Abnormal weight gain: Secondary | ICD-10-CM

## 2018-04-27 DIAGNOSIS — Z8632 Personal history of gestational diabetes: Secondary | ICD-10-CM

## 2018-04-27 DIAGNOSIS — Z758 Other problems related to medical facilities and other health care: Secondary | ICD-10-CM

## 2018-04-27 DIAGNOSIS — I10 Essential (primary) hypertension: Secondary | ICD-10-CM

## 2018-04-27 NOTE — Progress Notes (Signed)
   PRENATAL VISIT NOTE  Subjective:  Maria Daniel is a 37 y.o. [id number] at 3255w0d being seen today for ongoing prenatal care.  She is currently monitored for the following issues for this high-risk pregnancy and has H/O: C-section; Borderline hypertension; Pain at surgical incision; Weight loss; Thyroid disease; Essential hypertension; Non compliance w medication regimen; Hyperthyroidism; H/O gestational diabetes in prior pregnancy, currently pregnant; Supervision of other normal pregnancy, antepartum; Language barrier; and Weight gain on their problem list.  Patient reports bilateral lower back pain.  Contractions: Not present. Vag. Bleeding: None.  Movement: Present. Denies leaking of fluid. Patient concerned because she reports she has gained 50 pounds in the last 6 months.  The following portions of the patient's history were reviewed and updated as appropriate: allergies, current medications, past family history, past medical history, past social history, past surgical history and problem list. Problem list updated.  Objective:   Vitals:   04/27/18 1656  BP: (!) 120/59  Pulse: 72  Weight: 227 lb 8 oz (103.2 kg)   Fetal Status: Fetal Heart Rate (bpm): 146   Movement: Present     General:  Alert, oriented and cooperative. Patient is in no acute distress.  Skin: Skin is warm and dry. No rash noted.   Cardiovascular: Normal heart rate noted  Respiratory: Normal respiratory effort, no problems with respiration noted  Abdomen: Soft, gravid, appropriate for gestational age.  Pain/Pressure: Absent     Pelvic: Cervical exam deferred        Extremities: Normal range of motion.  Edema: None  Mental Status: Normal mood and affect. Normal behavior. Normal judgment and thought content.   Assessment and Plan:  Pregnancy: [id number] at 6955w0d  1. Essential hypertension Per PCP, prescribed labetalol 100 mg BID but patient not taking BP stable today, recommend she continue to not take it but  will monitor, may need to restart Cont on baby ASA  2. Hyperthyroidism Started on methimazole 5 mg BID after last visit, she started taking it regularly 3 days ago Encouraged her to continue regular use Referral to endocrine made Repeat growth US ordered  3. H/O gestational diabetes in prior pregnancy, currently pregnant 1 hr GTT today  4. Supervision of other normal pregnancy, antepartum - CBC - HIV antibody - Glucose tolerance, 1 hour - RPR  5. Language barrier Engineer, structuralpanish translator used  6. H/O: C-section Needs repeat, scheduled for 39 weeks  7. Weight gain Reports she has gained 50 pounds in the pregnancy, per record, has gained 13 but was late to care She has seen nutrition in past and is not following any kind of diet, I offered nutrition consult and counseled her regarding appropriate weight gain but she declines nutrition consult, states she knows what she should eat but just doesn't She will start following diet and see if this brings weight gain under control   Preterm labor symptoms and general obstetric precautions including but not limited to vaginal bleeding, contractions, leaking of fluid and fetal movement were reviewed in detail with the patient. Please refer to After Visit Summary for other counseling recommendations.  Return in about 2 weeks (around 05/11/2018) for OB visit (MD), 2 hr GTT, 3rd trim labs.  Future Appointments  Date Time Provider Department Center  05/25/2018  3:55 PM Levie HeritageStinson, Jacob J, DO [facility]    Conan BowensKelly M Salene Mohamud, MD

## 2018-04-28 LAB — CBC
Hematocrit: 33.1 % — ABNORMAL LOW (ref 34.0–46.6)
Hemoglobin: 10.7 g/dL — ABNORMAL LOW (ref 11.1–15.9)
MCH: 25.4 pg — AB (ref 26.6–33.0)
MCHC: 32.3 g/dL (ref 31.5–35.7)
MCV: 78 fL — ABNORMAL LOW (ref 79–97)
PLATELETS: 333 10*3/uL (ref 150–450)
RBC: 4.22 x10E6/uL (ref 3.77–5.28)
RDW: 14.6 % (ref 12.3–15.4)
WBC: 10.4 10*3/uL (ref 3.4–10.8)

## 2018-04-28 LAB — GLUCOSE TOLERANCE, 1 HOUR: Glucose, 1Hr PP: 128 mg/dL (ref 65–199)

## 2018-04-28 LAB — RPR: RPR Ser Ql: NONREACTIVE

## 2018-04-28 LAB — HIV ANTIBODY (ROUTINE TESTING W REFLEX): HIV SCREEN 4TH GENERATION: NONREACTIVE

## 2018-04-29 ENCOUNTER — Telehealth: Payer: Self-pay

## 2018-04-29 NOTE — Telephone Encounter (Signed)
Called pt to advise of US appt on 05/14/18 @ 8am,no answer, left message.

## 2018-05-04 ENCOUNTER — Other Ambulatory Visit: Payer: Self-pay

## 2018-05-04 DIAGNOSIS — E079 Disorder of thyroid, unspecified: Secondary | ICD-10-CM

## 2018-05-04 NOTE — Progress Notes (Signed)
Called [facility] and was advised that [facility] but the appt will be scheduled for sometime in September.  I also informed to put the ref in Epic and they will review with their provider and then they will call the pt with appt.

## 2018-05-11 ENCOUNTER — Encounter: Payer: No Typology Code available for payment source | Admitting: Obstetrics and Gynecology

## 2018-05-14 ENCOUNTER — Other Ambulatory Visit: Payer: Self-pay | Admitting: Obstetrics and Gynecology

## 2018-05-14 ENCOUNTER — Ambulatory Visit (HOSPITAL_COMMUNITY)
Admission: RE | Admit: 2018-05-14 | Discharge: 2018-05-14 | Disposition: A | Discharge status: Home / Self Care | Payer: No Typology Code available for payment source | Source: Ambulatory Visit | Attending: Obstetrics and Gynecology | Admitting: Obstetrics and Gynecology

## 2018-05-14 DIAGNOSIS — O10919 Unspecified pre-existing hypertension complicating pregnancy, unspecified trimester: Secondary | ICD-10-CM

## 2018-05-14 DIAGNOSIS — Z98891 History of uterine scar from previous surgery: Secondary | ICD-10-CM

## 2018-05-14 DIAGNOSIS — Z8632 Personal history of gestational diabetes: Secondary | ICD-10-CM | POA: Insufficient documentation

## 2018-05-14 DIAGNOSIS — Z3A28 28 weeks gestation of pregnancy: Secondary | ICD-10-CM | POA: Insufficient documentation

## 2018-05-14 DIAGNOSIS — E0590 Thyrotoxicosis, unspecified without thyrotoxic crisis or storm: Secondary | ICD-10-CM

## 2018-05-14 DIAGNOSIS — O99213 Obesity complicating pregnancy, third trimester: Secondary | ICD-10-CM

## 2018-05-14 DIAGNOSIS — O99280 Endocrine, nutritional and metabolic diseases complicating pregnancy, unspecified trimester: Secondary | ICD-10-CM | POA: Insufficient documentation

## 2018-05-14 DIAGNOSIS — O09293 Supervision of pregnancy with other poor reproductive or obstetric history, third trimester: Secondary | ICD-10-CM

## 2018-05-14 DIAGNOSIS — O10013 Pre-existing essential hypertension complicating pregnancy, third trimester: Secondary | ICD-10-CM | POA: Insufficient documentation

## 2018-05-18 ENCOUNTER — Ambulatory Visit: Payer: No Typology Code available for payment source | Attending: Family Medicine

## 2018-05-25 ENCOUNTER — Ambulatory Visit (INDEPENDENT_AMBULATORY_CARE_PROVIDER_SITE_OTHER): Payer: Self-pay | Admitting: Family Medicine

## 2018-05-25 VITALS — BP 135/90 | Wt 232.4 lb

## 2018-05-25 DIAGNOSIS — Z758 Other problems related to medical facilities and other health care: Secondary | ICD-10-CM

## 2018-05-25 DIAGNOSIS — E0590 Thyrotoxicosis, unspecified without thyrotoxic crisis or storm: Secondary | ICD-10-CM

## 2018-05-25 DIAGNOSIS — R319 Hematuria, unspecified: Secondary | ICD-10-CM

## 2018-05-25 DIAGNOSIS — Z789 Other specified health status: Secondary | ICD-10-CM

## 2018-05-25 DIAGNOSIS — Z98891 History of uterine scar from previous surgery: Secondary | ICD-10-CM

## 2018-05-25 DIAGNOSIS — Z3480 Encounter for supervision of other normal pregnancy, unspecified trimester: Secondary | ICD-10-CM

## 2018-05-25 DIAGNOSIS — I10 Essential (primary) hypertension: Secondary | ICD-10-CM

## 2018-05-25 DIAGNOSIS — Z23 Encounter for immunization: Secondary | ICD-10-CM

## 2018-05-25 MED ORDER — METHIMAZOLE 5 MG PO TABS: 5.0000 mg | ORAL_TABLET | Freq: Two times a day (BID) | ORAL | 1 refills | Status: DC

## 2018-05-25 NOTE — Progress Notes (Signed)
   PRENATAL VISIT NOTE  Subjective:  Maria Daniel is a 37 y.o. [id number] at 3233w0d being seen today for ongoing prenatal care.  She is currently monitored for the following issues for this high-risk pregnancy and has H/O: C-section; Borderline hypertension; Pain at surgical incision; Weight loss; Thyroid disease; Essential hypertension; Non compliance w medication regimen; Hyperthyroidism; H/O gestational diabetes in prior pregnancy, currently pregnant; Supervision of other normal pregnancy, antepartum; Language barrier; and Weight gain on their problem list.  Patient reports hip pain bilaterally.  Contractions: Irritability. Vag. Bleeding: None.  Movement: Present. Denies leaking of fluid.   The following portions of the patient's history were reviewed and updated as appropriate: allergies, current medications, past family history, past medical history, past social history, past surgical history and problem list. Problem list updated.  Objective:   Vitals:   05/25/18 1613  BP: 135/90  Weight: 232 lb 6.4 oz (105.4 kg)    Fetal Status: Fetal Heart Rate (bpm): 158 Fundal Height: 34 cm Movement: Present     General:  Alert, oriented and cooperative. Patient is in no acute distress.  Skin: Skin is warm and dry. No rash noted.   Cardiovascular: Normal heart rate noted  Respiratory: Normal respiratory effort, no problems with respiration noted  Abdomen: Soft, gravid, appropriate for gestational age.  Pain/Pressure: Present     Pelvic: Cervical exam deferred        Extremities: Normal range of motion.  Edema: None  Mental Status: Normal mood and affect. Normal behavior. Normal judgment and thought content.   Assessment and Plan:  Pregnancy: [id number] at 2533w0d  1. Supervision of other normal pregnancy, antepartum FHT normal - Tdap vaccine greater than or equal to 7yo IM - US MFM OB FOLLOW UP; Future  2. Hyperthyroidism Lost medicine, so hasn't taken it this week. Will refill  3.  Essential hypertension BP slightly elevated. Not on labetalol - US MFM OB FOLLOW UP; Future  4. H/O: C-section Will schedule repeat c/s  5. Language barrier Interpreter used  6. Hematuria, unspecified type Had 2 mornings with gross hematuria (yesterday and the day before). No dysuria. Will check culture. - Urine Culture  Preterm labor symptoms and general obstetric precautions including but not limited to vaginal bleeding, contractions, leaking of fluid and fetal movement were reviewed in detail with the patient. Please refer to After Visit Summary for other counseling recommendations.  Return in about 2 weeks (around 06/08/2018) for NST/BPP, HR OB f/u.  No future appointments.  Levie HeritageJacob J Stinson, DO

## 2018-05-25 NOTE — Addendum Note (Signed)
Addended by: Levie HeritageSTINSON, Macalister Arnaud J on: 05/25/2018 04:43 PM   Modules accepted: Orders

## 2018-05-27 ENCOUNTER — Encounter (HOSPITAL_COMMUNITY): Payer: Self-pay

## 2018-05-27 LAB — URINE CULTURE

## 2018-06-03 ENCOUNTER — Encounter: Payer: Self-pay | Admitting: Endocrinology

## 2018-06-11 ENCOUNTER — Encounter: Payer: Self-pay | Admitting: Obstetrics and Gynecology

## 2018-06-11 ENCOUNTER — Ambulatory Visit (INDEPENDENT_AMBULATORY_CARE_PROVIDER_SITE_OTHER): Payer: Self-pay | Admitting: *Deleted

## 2018-06-11 ENCOUNTER — Telehealth: Payer: Self-pay | Admitting: Family Medicine

## 2018-06-11 ENCOUNTER — Ambulatory Visit: Payer: Self-pay

## 2018-06-11 ENCOUNTER — Ambulatory Visit (INDEPENDENT_AMBULATORY_CARE_PROVIDER_SITE_OTHER): Payer: Self-pay | Admitting: Obstetrics and Gynecology

## 2018-06-11 VITALS — BP 131/66 | HR 84 | Wt 233.0 lb

## 2018-06-11 DIAGNOSIS — O0990 Supervision of high risk pregnancy, unspecified, unspecified trimester: Secondary | ICD-10-CM

## 2018-06-11 DIAGNOSIS — O10919 Unspecified pre-existing hypertension complicating pregnancy, unspecified trimester: Secondary | ICD-10-CM

## 2018-06-11 DIAGNOSIS — Z98891 History of uterine scar from previous surgery: Secondary | ICD-10-CM

## 2018-06-11 DIAGNOSIS — E0590 Thyrotoxicosis, unspecified without thyrotoxic crisis or storm: Secondary | ICD-10-CM

## 2018-06-11 NOTE — Progress Notes (Signed)

## 2018-06-11 NOTE — Telephone Encounter (Signed)
Patient appt was made for 8-25 @10 :15 and 11:15am, patient refused to take the appts she said she couldn't come that week due to she have to work and no child care. She said she will make her appts for the following week,

## 2018-06-11 NOTE — Progress Notes (Signed)
US for growth scheduled on 7/22

## 2018-06-12 LAB — T4, FREE: FREE T4: 1.37 ng/dL (ref 0.82–1.77)

## 2018-06-12 LAB — T3, FREE: T3 FREE: 3.1 pg/mL (ref 2.0–4.4)

## 2018-06-12 LAB — TSH: TSH: <0.006 u[IU]/mL — ABNORMAL LOW (ref 0.450–4.500)

## 2018-06-12 NOTE — Progress Notes (Addendum)
Prenatal Visit Note Date: 06/11/2018 [facility]  Subjective:  Maria Daniel is a 37 y.o. [id number] at 7449w3d being seen today for ongoing prenatal care.  She is currently monitored for the following issues for this high-risk pregnancy and has H/O: C-section; Borderline hypertension; Pain at surgical incision; Weight loss; Thyroid disease; Chronic hypertension during pregnancy, antepartum; Non compliance w medication regimen; Hyperthyroidism; H/O gestational diabetes in prior pregnancy, currently pregnant; Supervision of high risk pregnancy, antepartum, third trimester; Language barrier; and Weight gain on their problem list.  Patient reports no complaints.   Contractions: Irregular. Vag. Bleeding: None.  Movement: Present. Denies leaking of fluid.   The following portions of the patient's history were reviewed and updated as appropriate: allergies, current medications, past family history, past medical history, past social history, past surgical history and problem list. Problem list updated.  Objective:   Vitals:   06/11/18 1610  BP: 131/66  Pulse: 84  Weight: 233 lb (105.7 kg)    Fetal Status: Fetal Heart Rate (bpm): NST   Movement: Present     General:  Alert, oriented and cooperative. Patient is in no acute distress.  Skin: Skin is warm and dry. No rash noted.   Cardiovascular: Normal heart rate noted  Respiratory: Normal respiratory effort, no problems with respiration noted  Abdomen: Soft, gravid, appropriate for gestational age. Pain/Pressure: Present     Pelvic:  Cervical exam deferred        Extremities: Normal range of motion.  Edema: None  Mental Status: Normal mood and affect. Normal behavior. Normal judgment and thought content.   Urinalysis:      Assessment and Plan:  Pregnancy: [id number] at 6349w3d  1. Supervision of high risk pregnancy, antepartum Routine care. Ask pt about btl nv. Doesn't have insurance  2. Chronic hypertension  during pregnancy, antepartum Continue low dose asa. bpp 10/10 today. Has rpt growth next week.  3. Hyperthyroidism Taking mmz qday and not bid. F/u labs - TSH - T3, free - T4, free  4. H/O: C-section Already scheduled.   Interpreter used  Preterm labor symptoms and general obstetric precautions including but not limited to vaginal bleeding, contractions, leaking of fluid and fetal movement were reviewed in detail with the patient. Please refer to After Visit Summary for other counseling recommendations.  Return in about 1 week (around 06/18/2018) for nst/bpp. 2wk nst/bpp/hrob.   Lingle BingPickens, Somalia Segler, MD

## 2018-06-15 ENCOUNTER — Ambulatory Visit (HOSPITAL_COMMUNITY): Payer: Self-pay

## 2018-06-15 ENCOUNTER — Other Ambulatory Visit: Payer: Self-pay | Admitting: Obstetrics and Gynecology

## 2018-06-18 ENCOUNTER — Encounter: Payer: Self-pay | Admitting: Obstetrics & Gynecology

## 2018-06-18 ENCOUNTER — Other Ambulatory Visit: Payer: Self-pay

## 2018-06-22 ENCOUNTER — Other Ambulatory Visit: Payer: Self-pay | Admitting: Family Medicine

## 2018-06-22 ENCOUNTER — Ambulatory Visit (HOSPITAL_COMMUNITY)
Admission: RE | Admit: 2018-06-22 | Discharge: 2018-06-22 | Disposition: A | Discharge status: Home / Self Care | Payer: Self-pay | Source: Ambulatory Visit | Attending: Family Medicine | Admitting: Family Medicine

## 2018-06-22 DIAGNOSIS — Z3480 Encounter for supervision of other normal pregnancy, unspecified trimester: Secondary | ICD-10-CM

## 2018-06-22 DIAGNOSIS — Z3A34 34 weeks gestation of pregnancy: Secondary | ICD-10-CM | POA: Insufficient documentation

## 2018-06-22 DIAGNOSIS — O99213 Obesity complicating pregnancy, third trimester: Secondary | ICD-10-CM

## 2018-06-22 DIAGNOSIS — O10013 Pre-existing essential hypertension complicating pregnancy, third trimester: Secondary | ICD-10-CM | POA: Insufficient documentation

## 2018-06-22 DIAGNOSIS — O34219 Maternal care for unspecified type scar from previous cesarean delivery: Secondary | ICD-10-CM | POA: Insufficient documentation

## 2018-06-22 DIAGNOSIS — Z362 Encounter for other antenatal screening follow-up: Secondary | ICD-10-CM

## 2018-06-22 DIAGNOSIS — I10 Essential (primary) hypertension: Secondary | ICD-10-CM

## 2018-06-25 ENCOUNTER — Ambulatory Visit (INDEPENDENT_AMBULATORY_CARE_PROVIDER_SITE_OTHER): Payer: Self-pay | Admitting: Obstetrics and Gynecology

## 2018-06-25 ENCOUNTER — Ambulatory Visit (INDEPENDENT_AMBULATORY_CARE_PROVIDER_SITE_OTHER): Payer: Self-pay | Admitting: *Deleted

## 2018-06-25 ENCOUNTER — Encounter: Payer: Self-pay | Admitting: Obstetrics and Gynecology

## 2018-06-25 VITALS — BP 128/77 | HR 93 | Wt 233.5 lb

## 2018-06-25 DIAGNOSIS — Z789 Other specified health status: Secondary | ICD-10-CM

## 2018-06-25 DIAGNOSIS — O10913 Unspecified pre-existing hypertension complicating pregnancy, third trimester: Secondary | ICD-10-CM

## 2018-06-25 DIAGNOSIS — Z98891 History of uterine scar from previous surgery: Secondary | ICD-10-CM

## 2018-06-25 DIAGNOSIS — O09299 Supervision of pregnancy with other poor reproductive or obstetric history, unspecified trimester: Secondary | ICD-10-CM

## 2018-06-25 DIAGNOSIS — O10919 Unspecified pre-existing hypertension complicating pregnancy, unspecified trimester: Secondary | ICD-10-CM

## 2018-06-25 DIAGNOSIS — E0590 Thyrotoxicosis, unspecified without thyrotoxic crisis or storm: Secondary | ICD-10-CM

## 2018-06-25 DIAGNOSIS — O0990 Supervision of high risk pregnancy, unspecified, unspecified trimester: Secondary | ICD-10-CM

## 2018-06-25 DIAGNOSIS — Z8632 Personal history of gestational diabetes: Secondary | ICD-10-CM

## 2018-06-25 DIAGNOSIS — O0993 Supervision of high risk pregnancy, unspecified, third trimester: Secondary | ICD-10-CM

## 2018-06-25 DIAGNOSIS — O09293 Supervision of pregnancy with other poor reproductive or obstetric history, third trimester: Secondary | ICD-10-CM

## 2018-06-25 NOTE — Progress Notes (Signed)
Interpreter Nile RiggsMariel Gallego present for encounter. US for growth/BPP done 7/29.  Pt states the baby is moving a lot and thinks the position has changed from breech. Informal US performed - vertex presentation observed. Pt wants to know results of thyroid tests from last week and if she needs to resume taking medication twice daily. Pt is having difficulty with weekly visits due to child care and having only one car. She requests not to come back next week.  She would like to return in 2 weeks.

## 2018-06-25 NOTE — Progress Notes (Signed)
Subjective:  Maria Daniel is a 37 y.o. [id number] at 3918w3d being seen today for ongoing prenatal care.  She is currently monitored for the following issues for this high-risk pregnancy and has H/O: C-section; Borderline hypertension; Weight loss; Thyroid disease; Chronic hypertension during pregnancy, antepartum; Non compliance w medication regimen; Hyperthyroidism; H/O gestational diabetes in prior pregnancy, currently pregnant; Supervision of high risk pregnancy, antepartum, third trimester; Language barrier; and Weight gain on their problem list.  Patient reports no complaints.  Contractions: Irregular. Vag. Bleeding: None.  Movement: Present. Denies leaking of fluid.   The following portions of the patient's history were reviewed and updated as appropriate: allergies, current medications, past family history, past medical history, past social history, past surgical history and problem list. Problem list updated.  Objective:   Vitals:   06/25/18 1559  BP: 128/77  Pulse: 93  Weight: 233 lb 8 oz (105.9 kg)    Fetal Status: Fetal Heart Rate (bpm): NST   Movement: Present     General:  Alert, oriented and cooperative. Patient is in no acute distress.  Skin: Skin is warm and dry. No rash noted.   Cardiovascular: Normal heart rate noted  Respiratory: Normal respiratory effort, no problems with respiration noted  Abdomen: Soft, gravid, appropriate for gestational age. Pain/Pressure: Present     Pelvic:  Cervical exam deferred        Extremities: Normal range of motion.  Edema: None  Mental Status: Normal mood and affect. Normal behavior. Normal judgment and thought content.   Urinalysis:      Assessment and Plan:  Pregnancy: [id number] at 2318w3d  1. Supervision of high risk pregnancy, antepartum Stable BTL at time of c section, self pay  2. Chronic hypertension during pregnancy, antepartum BP stable Continue with BASA BPP/10/10 today Continue with weekly antenatal testing  3.  Hyperthyroidism Repeat Free T4 are 36 weeks Continue with daily methimazole  4. H/O: C-section For repeat with BTL, self pay  5. Language barrier Interrupter used during today's visit  6. H/O gestational diabetes in prior pregnancy, currently pregnant Normal glucola  Preterm labor symptoms and general obstetric precautions including but not limited to vaginal bleeding, contractions, leaking of fluid and fetal movement were reviewed in detail with the patient. Please refer to After Visit Summary for other counseling recommendations.  Return in about 1 week (around 07/02/2018) for NST/BPP only;  2 weeks HOB and NST/BPP, OB visit.   Hermina StaggersErvin, Sosie Gato L, MD

## 2018-06-29 ENCOUNTER — Ambulatory Visit: Payer: Self-pay

## 2018-06-29 ENCOUNTER — Ambulatory Visit (INDEPENDENT_AMBULATORY_CARE_PROVIDER_SITE_OTHER): Payer: Self-pay | Admitting: *Deleted

## 2018-06-29 VITALS — BP 123/64 | HR 75 | Wt 235.3 lb

## 2018-06-29 DIAGNOSIS — E0590 Thyrotoxicosis, unspecified without thyrotoxic crisis or storm: Secondary | ICD-10-CM

## 2018-06-29 DIAGNOSIS — O10919 Unspecified pre-existing hypertension complicating pregnancy, unspecified trimester: Secondary | ICD-10-CM

## 2018-06-29 DIAGNOSIS — O10913 Unspecified pre-existing hypertension complicating pregnancy, third trimester: Secondary | ICD-10-CM

## 2018-06-29 NOTE — Progress Notes (Signed)
Interpreter Mariel Gallego present for encounter.  Pt informed that the ultrasound is considered a limited OB ultrasound and is not intended to be a complete ultrasound exam.  Patient also informed that the ultrasound is not being completed with the intent of assessing for fetal or placental anomalies or any pelvic abnormalities.  Explained that the purpose of today's ultrasound is to assess for presentation, BPP and amniotic fluid volume.  Patient acknowledges the purpose of the exam and the limitations of the study.    

## 2018-07-06 ENCOUNTER — Ambulatory Visit (INDEPENDENT_AMBULATORY_CARE_PROVIDER_SITE_OTHER): Payer: Self-pay | Admitting: Obstetrics & Gynecology

## 2018-07-06 ENCOUNTER — Ambulatory Visit (INDEPENDENT_AMBULATORY_CARE_PROVIDER_SITE_OTHER): Payer: Self-pay | Admitting: *Deleted

## 2018-07-06 ENCOUNTER — Ambulatory Visit: Payer: Self-pay

## 2018-07-06 VITALS — BP 134/64 | HR 80 | Wt 240.0 lb

## 2018-07-06 DIAGNOSIS — Z113 Encounter for screening for infections with a predominantly sexual mode of transmission: Secondary | ICD-10-CM

## 2018-07-06 DIAGNOSIS — Z98891 History of uterine scar from previous surgery: Secondary | ICD-10-CM

## 2018-07-06 DIAGNOSIS — O0993 Supervision of high risk pregnancy, unspecified, third trimester: Secondary | ICD-10-CM

## 2018-07-06 DIAGNOSIS — Z789 Other specified health status: Secondary | ICD-10-CM

## 2018-07-06 DIAGNOSIS — O09299 Supervision of pregnancy with other poor reproductive or obstetric history, unspecified trimester: Secondary | ICD-10-CM

## 2018-07-06 DIAGNOSIS — Z8632 Personal history of gestational diabetes: Secondary | ICD-10-CM

## 2018-07-06 DIAGNOSIS — E079 Disorder of thyroid, unspecified: Secondary | ICD-10-CM

## 2018-07-06 DIAGNOSIS — O10919 Unspecified pre-existing hypertension complicating pregnancy, unspecified trimester: Secondary | ICD-10-CM

## 2018-07-06 DIAGNOSIS — E0590 Thyrotoxicosis, unspecified without thyrotoxic crisis or storm: Secondary | ICD-10-CM

## 2018-07-06 NOTE — Progress Notes (Signed)
   PRENATAL VISIT NOTE  Subjective:  Maria Daniel is a 37 y.o. [id number] at 369w0d being seen today for ongoing prenatal care.  She is currently monitored for the following issues for this high-risk pregnancy and has H/O: C-section; Borderline hypertension; Thyroid disease; Chronic hypertension during pregnancy, antepartum; Non compliance w medication regimen; Hyperthyroidism; H/O gestational diabetes in prior pregnancy, currently pregnant; Supervision of high risk pregnancy, antepartum, third trimester; and Language barrier on their problem list.  Patient reports no complaints.  Contractions: Irritability. Vag. Bleeding: None.  Movement: Present. Denies leaking of fluid.   The following portions of the patient's history were reviewed and updated as appropriate: allergies, current medications, past family history, past medical history, past social history, past surgical history and problem list. Problem list updated.  Objective:   Vitals:   07/06/18 1514  BP: 134/64  Pulse: 80  Weight: 240 lb (108.9 kg)    Fetal Status: Fetal Heart Rate (bpm): 160   Movement: Present     General:  Alert, oriented and cooperative. Patient is in no acute distress.  Skin: Skin is warm and dry. No rash noted.   Cardiovascular: Normal heart rate noted  Respiratory: Normal respiratory effort, no problems with respiration noted  Abdomen: Soft, gravid, appropriate for gestational age.  Pain/Pressure: Present     Pelvic: Cervical exam performed        Extremities: Normal range of motion.  Edema: None  Mental Status: Normal mood and affect. Normal behavior. Normal judgment and thought content.   Assessment and Plan:  Pregnancy: [id number] at 579w0d  1. Chronic hypertension during pregnancy, antepartum Stable.  To stop ASA  2. Supervision of high risk pregnancy, antepartum, third trimester  - GC/Chlamydia probe amp ([place])not at [facility] - Culture, beta strep (group b only)  3. H/O gestational  diabetes in prior pregnancy, currently pregnant testing WNL this pregnancy  4. H/O: C-section Repeat with BTL scheduled for 07/28/2018  5. Hyperthyroidism testing WNL 06/11/2018 Repeat labs today  NST reviewed and reactive  6. Language barrier Spanish interpreter present for entire visit.  Preterm labor symptoms and general obstetric precautions including but not limited to vaginal bleeding, contractions, leaking of fluid and fetal movement were reviewed in detail with the patient. Please refer to After Visit Summary for other counseling recommendations.  Return in about 1 week (around 07/13/2018).  Future Appointments  Date Time Provider Department Center  07/13/2018  3:15 PM [facility] NST [facility]  07/13/2018  4:15 PM Hermina StaggersErvin, Michael L, MD [facility]  07/20/2018  2:15 PM [facility] NST [facility]  07/20/2018  3:15 PM Adam PhenixArnold, James G, MD [facility]    Willodean Rosenthalarolyn Harraway-Smith, MD

## 2018-07-06 NOTE — Progress Notes (Addendum)
Pt informed that the ultrasound is considered a limited OB ultrasound and is not intended to be a complete ultrasound exam.  Patient also informed that the ultrasound is not being completed with the intent of assessing for fetal or placental anomalies or any pelvic abnormalities.  Explained that the purpose of today's ultrasound is to assess for presentation, BPP and amniotic fluid volume.  Patient acknowledges the purpose of the exam and the limitations of the study.    Attestation of Attending Supervision of RN: Evaluation and management procedures were performed by the nurse under my supervision and collaboration.  I have reviewed the nursing note and chart, and I agree with the management and plan.  Carolyn L. Harraway-Smith, M.D., FACOG   

## 2018-07-07 LAB — GC/CHLAMYDIA PROBE AMP (~~LOC~~) NOT AT ARMC
Chlamydia: NEGATIVE
Neisseria Gonorrhea: NEGATIVE

## 2018-07-10 ENCOUNTER — Telehealth (HOSPITAL_COMMUNITY): Payer: Self-pay | Admitting: *Deleted

## 2018-07-10 LAB — T4, FREE: FREE T4: 1.39 ng/dL (ref 0.82–1.77)

## 2018-07-10 LAB — TSH

## 2018-07-10 LAB — T3: T3, Total: 196 ng/dL — ABNORMAL HIGH (ref 71–180)

## 2018-07-10 LAB — CULTURE, BETA STREP (GROUP B ONLY): Strep Gp B Culture: NEGATIVE

## 2018-07-10 NOTE — Telephone Encounter (Signed)
Preadmission screen  

## 2018-07-10 NOTE — Pre-Procedure Instructions (Signed)
Interpreter number [phone] 

## 2018-07-13 ENCOUNTER — Encounter: Payer: Self-pay | Admitting: Obstetrics & Gynecology

## 2018-07-13 ENCOUNTER — Ambulatory Visit: Payer: Self-pay

## 2018-07-13 ENCOUNTER — Encounter: Payer: Self-pay | Admitting: Obstetrics and Gynecology

## 2018-07-13 ENCOUNTER — Ambulatory Visit (INDEPENDENT_AMBULATORY_CARE_PROVIDER_SITE_OTHER): Payer: Self-pay | Admitting: *Deleted

## 2018-07-13 ENCOUNTER — Ambulatory Visit (INDEPENDENT_AMBULATORY_CARE_PROVIDER_SITE_OTHER): Payer: Self-pay | Admitting: Obstetrics and Gynecology

## 2018-07-13 VITALS — BP 143/74 | HR 76 | Wt 239.9 lb

## 2018-07-13 DIAGNOSIS — O10919 Unspecified pre-existing hypertension complicating pregnancy, unspecified trimester: Secondary | ICD-10-CM

## 2018-07-13 DIAGNOSIS — Z789 Other specified health status: Secondary | ICD-10-CM

## 2018-07-13 DIAGNOSIS — E0590 Thyrotoxicosis, unspecified without thyrotoxic crisis or storm: Secondary | ICD-10-CM

## 2018-07-13 DIAGNOSIS — O10913 Unspecified pre-existing hypertension complicating pregnancy, third trimester: Secondary | ICD-10-CM

## 2018-07-13 DIAGNOSIS — O0993 Supervision of high risk pregnancy, unspecified, third trimester: Secondary | ICD-10-CM

## 2018-07-13 DIAGNOSIS — Z98891 History of uterine scar from previous surgery: Secondary | ICD-10-CM

## 2018-07-13 NOTE — Progress Notes (Signed)
Subjective:  Maria Daniel is a 37 y.o. [id number] at 735w0d being seen today for ongoing prenatal care.  She is currently monitored for the following issues for this high-risk pregnancy and has H/O: C-section; Borderline hypertension; Thyroid disease; Chronic hypertension during pregnancy, antepartum; Non compliance w medication regimen; Hyperthyroidism; H/O gestational diabetes in prior pregnancy, currently pregnant; Supervision of high risk pregnancy, antepartum, third trimester; and Language barrier on their problem list.  Patient reports no complaints.  Contractions: Irregular. Vag. Bleeding: None.  Movement: Present. Denies leaking of fluid.   The following portions of the patient's history were reviewed and updated as appropriate: allergies, current medications, past family history, past medical history, past social history, past surgical history and problem list. Problem list updated.  Objective:   Vitals:   07/13/18 1555  BP: (!) 143/74  Pulse: 76  Weight: 239 lb 14.4 oz (108.8 kg)    Fetal Status: Fetal Heart Rate (bpm): NST   Movement: Present     General:  Alert, oriented and cooperative. Patient is in no acute distress.  Skin: Skin is warm and dry. No rash noted.   Cardiovascular: Normal heart rate noted  Respiratory: Normal respiratory effort, no problems with respiration noted  Abdomen: Soft, gravid, appropriate for gestational age. Pain/Pressure: Present     Pelvic:  Cervical exam deferred        Extremities: Normal range of motion.  Edema: Mild pitting, slight indentation  Mental Status: Normal mood and affect. Normal behavior. Normal judgment and thought content.   Urinalysis:      Assessment and Plan:  Pregnancy: [id number] at 5235w0d  1. Supervision of high risk pregnancy, antepartum, third trimester Stable   2. Chronic hypertension during pregnancy, antepartum BPP/10/10 today BP stable Continue with antenatal testing  3. Hyperthyroidism Stable Continue  with Tapazole  4. H/O: C-section For repeat with BTL on 07/28/18  5. Language barrier Interrupter services used during today's visit  Term labor symptoms and general obstetric precautions including but not limited to vaginal bleeding, contractions, leaking of fluid and fetal movement were reviewed in detail with the patient. Please refer to After Visit Summary for other counseling recommendations.  Return in about 1 week (around 07/20/2018) for as scheduled.   Hermina StaggersErvin, Lexa Coronado L, MD

## 2018-07-13 NOTE — Progress Notes (Signed)

## 2018-07-13 NOTE — Progress Notes (Signed)
Interpreter Maretta LosBlanca Lindner present for encounter.  Rpt C/S scheduled on 9/3

## 2018-07-14 NOTE — Pre-Procedure Instructions (Signed)
[phone] interpreter number

## 2018-07-16 ENCOUNTER — Encounter (HOSPITAL_COMMUNITY): Payer: Self-pay

## 2018-07-16 ENCOUNTER — Telehealth (HOSPITAL_COMMUNITY): Payer: Self-pay | Admitting: *Deleted

## 2018-07-16 NOTE — Telephone Encounter (Signed)
Preadmission screen [phone] interpreter number

## 2018-07-16 NOTE — Pre-Procedure Instructions (Signed)
Interpreter number [phone]

## 2018-07-17 NOTE — Patient Instructions (Signed)
Maria Daniel  07/17/2018   Your procedure is scheduled on:  07/28/2018  [facility].  Pick up the phone at the desk and dial [phone]  Call this number if you have problems the morning of surgery:[phone]  Remember:   Do not eat food:(After Midnight) Desps de medianoche.  Do not drink clear liquids: (After Midnight) Desps de medianoche.  Take these medicines the morning of surgery with A SIP OF WATER: TAKE YOUR TAPAZOLE AS PRESCRIBED   Do not wear jewelry, make-up or nail polish.  Do not wear lotions, powders, or perfumes. Do not wear deodorant.  Do not shave 48 hours prior to surgery.  Do not bring valuables to the hospital.  [facility] is not   responsible for any belongings or valuables brought to the hospital.  Contacts, dentures or bridgework may not be worn into surgery.  Leave suitcase in the car. After surgery it may be brought to your room.  For patients admitted to the hospital, checkout time is 11:00 AM the day of              discharge.    N/A   Please read over the following fact sheets that you were given:   Surgical Site Infection Prevention

## 2018-07-19 ENCOUNTER — Encounter (HOSPITAL_COMMUNITY): Payer: Self-pay | Admitting: *Deleted

## 2018-07-19 ENCOUNTER — Inpatient Hospital Stay (HOSPITAL_COMMUNITY)
Admission: AD | Admit: 2018-07-19 | Discharge: 2018-07-19 | Disposition: A | Discharge status: Home / Self Care | Payer: Self-pay | Source: Ambulatory Visit | Attending: Obstetrics and Gynecology | Admitting: Obstetrics and Gynecology

## 2018-07-19 DIAGNOSIS — Z8632 Personal history of gestational diabetes: Secondary | ICD-10-CM

## 2018-07-19 DIAGNOSIS — J069 Acute upper respiratory infection, unspecified: Secondary | ICD-10-CM | POA: Insufficient documentation

## 2018-07-19 DIAGNOSIS — O368130 Decreased fetal movements, third trimester, not applicable or unspecified: Secondary | ICD-10-CM | POA: Insufficient documentation

## 2018-07-19 DIAGNOSIS — M79661 Pain in right lower leg: Secondary | ICD-10-CM | POA: Insufficient documentation

## 2018-07-19 DIAGNOSIS — Z3A37 37 weeks gestation of pregnancy: Secondary | ICD-10-CM | POA: Insufficient documentation

## 2018-07-19 DIAGNOSIS — R109 Unspecified abdominal pain: Secondary | ICD-10-CM | POA: Insufficient documentation

## 2018-07-19 DIAGNOSIS — Z3689 Encounter for other specified antenatal screening: Secondary | ICD-10-CM

## 2018-07-19 DIAGNOSIS — O26893 Other specified pregnancy related conditions, third trimester: Secondary | ICD-10-CM | POA: Insufficient documentation

## 2018-07-19 DIAGNOSIS — M79662 Pain in left lower leg: Secondary | ICD-10-CM | POA: Insufficient documentation

## 2018-07-19 DIAGNOSIS — B9789 Other viral agents as the cause of diseases classified elsewhere: Secondary | ICD-10-CM

## 2018-07-19 DIAGNOSIS — O09299 Supervision of pregnancy with other poor reproductive or obstetric history, unspecified trimester: Secondary | ICD-10-CM

## 2018-07-19 DIAGNOSIS — O99513 Diseases of the respiratory system complicating pregnancy, third trimester: Secondary | ICD-10-CM | POA: Insufficient documentation

## 2018-07-19 LAB — AMNISURE RUPTURE OF MEMBRANE (ROM) NOT AT ARMC: Amnisure ROM: NEGATIVE

## 2018-07-19 MED ORDER — BENZONATATE 100 MG PO CAPS: 200.0000 mg | ORAL_CAPSULE | Freq: Three times a day (TID) | ORAL | 0 refills | Status: DC | PRN

## 2018-07-19 MED ORDER — MG-PLUS PROTEIN 133 MG PO TABS: 2.0000 | ORAL_TABLET | Freq: Every evening | ORAL | 0 refills | Status: DC | PRN

## 2018-07-19 NOTE — Progress Notes (Signed)
Written and verbal d/c instructions given and understanding voiced. Sharen CounterLisa Leftwich-Kirby CNM in to discuss d/c instructions earlier with pt

## 2018-07-19 NOTE — MAU Provider Note (Signed)
Chief Complaint:  Labor Eval   None     HPI: Maria Daniel is a 37 y.o. [id number] at 38w6dwho presents to maternity admissions reporting decreased fetal movement x 24 hours. This is associated with mid/upper right sided abdominal pain and  lower abdomen pressure that is irregular and intermittent. She reports bilateral calf pain that is worsening in the last 3 days and cough x 3 days.  Her abdominal pain is located in her mid abdomen, is intermittent and sharp, and is significantly worse when coughing. There are no other associated symptoms. She has not tried any treatments. She is feeling fetal movement in MAU but only occasionally, less than usual.    HPI  Past Medical History: Past Medical History:  Diagnosis Date  . Complication of anesthesia    had trouble with spinal not working with last cs does not want a learner to do spinal  . Diabetes mellitus without complication (HCC)   . Dyspnea   . Gestational diabetes   . Hypertension    no meds patient denies  . Hyperthyroidism   . Insomnia   . Knee pain   . Miscarriage     Past obstetric history: OB History  Gravida Para Term Preterm AB Living  6 3 3   2 3   SAB TAB Ectopic Multiple Live Births  2     0 3    # Outcome Date GA Lbr Len/2nd Weight Sex Delivery Anes PTL Lv  6 Current           5 Term 04/23/16 [redacted]w[redacted]d  4060 g M CS-LTranv Spinal  LIV     Birth Comments: had problems requiring another surgery language barrier possible adhesions  4 SAB 05/2014             Birth Comments: System Generated. Please review and update pregnancy details.  3 Term 08/17/12 [redacted]w[redacted]d   M CS-LTranv Other  LIV  2 SAB 11/2002          1 Term 06/25/02 [redacted]w[redacted]d 20:00 2948 g M CS-Unspec EPI  LIV     Birth Comments: arrest of dilation    Past Surgical History: Past Surgical History:  Procedure Laterality Date  . CESAREAN SECTION    . CESAREAN SECTION  08/17/2012   Procedure: CESAREAN SECTION;  Surgeon: Lesly Dukes, MD;  Location: [facility] ORS;   Service: Obstetrics;  Laterality: N/A;  . CESAREAN SECTION N/A 04/23/2016   Procedure: CESAREAN SECTION;  Surgeon: Catalina Antigua, MD;  Location: [facility];  Service: Obstetrics;  Laterality: N/A;  . LESION REMOVAL N/A 11/11/2016   Procedure: EXCISION OF SUBCUTANEOUS LESION/ ABDOMEN;  Surgeon: Hermina Staggers, MD;  Location: [facility] ORS;  Service: Gynecology;  Laterality: N/A;    Family History: Family History  Problem Relation Age of Onset  . Hypertension Mother   . Kidney disease Father   . Multiple sclerosis Father     Social History: Social History   Tobacco Use  . Smoking status: Never Smoker  . Smokeless tobacco: Never Used  Substance Use Topics  . Alcohol use: No    Comment: socially  . Drug use: No    Allergies: No Known Allergies  Meds:  No medications prior to admission.    ROS:  Review of Systems  Constitutional: Negative for chills, fatigue and fever.  Eyes: Negative for visual disturbance.  Respiratory: Negative for shortness of breath.   Cardiovascular: Negative for chest pain.  Gastrointestinal: Positive for abdominal pain. Negative for nausea and  vomiting.  Genitourinary: Positive for pelvic pain. Negative for difficulty urinating, dysuria, flank pain, vaginal bleeding, vaginal discharge and vaginal pain.  Neurological: Negative for dizziness and headaches.  Psychiatric/Behavioral: Negative.      I have reviewed patient's Past Medical Hx, Surgical Hx, Family Hx, Social Hx, medications and allergies.   Physical Exam   Patient Vitals for the past 24 hrs:  BP Temp Temp src Pulse Resp SpO2 Height Weight  07/19/18 0419 128/63 - - 86 - - - -  07/19/18 0418 128/63 98.2 F (36.8 C) Oral 77 18 100 % - -  07/19/18 0152 126/71 - - 89 - - - -  07/19/18 0143 - - - - - - 5\' 4"  (1.626 m) 111.6 kg   Constitutional: Well-developed, well-nourished female in no acute distress.  Cardiovascular: normal rate Respiratory: normal effort GI: Abd soft,  non-tender, gravid appropriate for gestational age.  MS: Extremities nontender, +1 nonpitting edema BLE, normal ROM Left calf measures 49 cm, right calf 47.5 cm, cool to touch, negative Homans. Neurologic: Alert and oriented x 4.  GU: Neg CVAT.   Dilation: Closed Effacement (%): 50 Cervical Position: Middle Exam by:: Leftwich-Kirby, CNM  FHT:  Baseline 150 , moderate variability, accelerations present, no decelerations Contractions: None on toco or to palpation   Labs: Results for orders placed or performed during the hospital encounter of 07/19/18 (from the past 24 hour(s))  Amnisure rupture of membrane (rom)not at [facility]     Status: None   Collection Time: 07/19/18  3:35 AM  Result Value Ref Range   Amnisure ROM NEGATIVE    O/Positive/-- (05/01 1201)  Imaging:    MAU Course/MDM: Exam with negative pooling, negative ferning, and amnisure result negative so no evidence of ROM today NST reviewed and reactive Pt feeling fetal movement in MAU Pt placenta is posterior, and her abdominal pain is palpable and most severe at posterior margin of ribs.  Likely musculoskeletal pain related to recent coughing Recommend Tylenol 1000 mg Q 6 hours PRN,  Heating pad or ice PRN Tessalon Perles Rx for cough Leg pain is likely cramping/musculoskeletal, unlikely DVT with bilateral pain, no warmth, and calves relatively equal in size.  Rx for Magnesium PO Q HS. Keep scheduled appt/US in office Return to MAU as needed for emergencies or signs of labor  Pt discharged with strict return precautions.   Assessment: 1. Abdominal pain during pregnancy in third trimester   2. H/O gestational diabetes in prior pregnancy, currently pregnant   3. Decreased fetal movements in third trimester, single or unspecified fetus   4. NST (non-stress test) reactive   5. Viral URI with cough   6. Bilateral calf pain     Plan: Discharge home Labor precautions and fetal kick counts Follow-up Information     [facility] Follow up.   Specialty:  Obstetrics and Gynecology Why:  As scheduled, return to MAU as needed for emergencies Contact information: [address] [place] [address] [phone]         Allergies as of 07/19/2018   No Known Allergies     Medication List    TAKE these medications   aspirin EC 81 MG tablet Take 81 mg by mouth daily.   benzonatate 100 MG capsule Commonly known as:  TESSALON Take 2 capsules (200 mg total) by mouth 3 (three) times daily as needed for cough.   magnesium (amino acid chelate) 133 MG tablet Take 2 tablets by mouth at bedtime as  needed.   methimazole 5 MG tablet Commonly known as:  TAPAZOLE Take 1 tablet (5 mg total) by mouth 2 (two) times daily. What changed:  when to take this   PRENATAL/FOLIC ACID Tabs Take 1 tablet by mouth daily.       Sharen CounterLisa Leftwich-Kirby Certified Nurse-Midwife 07/19/2018 8:16 AM

## 2018-07-19 NOTE — MAU Note (Signed)
Pt reports pain since 1900 and LOf Since yesterday around midnight.

## 2018-07-20 ENCOUNTER — Ambulatory Visit (INDEPENDENT_AMBULATORY_CARE_PROVIDER_SITE_OTHER): Payer: Self-pay | Admitting: *Deleted

## 2018-07-20 ENCOUNTER — Encounter (HOSPITAL_COMMUNITY)
Admission: RE | Admit: 2018-07-20 | Discharge: 2018-07-20 | Disposition: A | Discharge status: Home / Self Care | Payer: Medicaid Other | Source: Ambulatory Visit | Attending: Obstetrics and Gynecology | Admitting: Obstetrics and Gynecology

## 2018-07-20 ENCOUNTER — Ambulatory Visit: Payer: Self-pay

## 2018-07-20 ENCOUNTER — Ambulatory Visit (INDEPENDENT_AMBULATORY_CARE_PROVIDER_SITE_OTHER): Payer: Self-pay | Admitting: Obstetrics & Gynecology

## 2018-07-20 VITALS — BP 133/87 | HR 90 | Wt 239.9 lb

## 2018-07-20 DIAGNOSIS — O10919 Unspecified pre-existing hypertension complicating pregnancy, unspecified trimester: Secondary | ICD-10-CM

## 2018-07-20 DIAGNOSIS — O10913 Unspecified pre-existing hypertension complicating pregnancy, third trimester: Secondary | ICD-10-CM

## 2018-07-20 DIAGNOSIS — E0590 Thyrotoxicosis, unspecified without thyrotoxic crisis or storm: Secondary | ICD-10-CM

## 2018-07-20 DIAGNOSIS — O0993 Supervision of high risk pregnancy, unspecified, third trimester: Secondary | ICD-10-CM

## 2018-07-20 DIAGNOSIS — Z789 Other specified health status: Secondary | ICD-10-CM

## 2018-07-20 DIAGNOSIS — Z98891 History of uterine scar from previous surgery: Secondary | ICD-10-CM

## 2018-07-20 NOTE — Patient Instructions (Signed)
Parto por cesárea °Cesarean Delivery °El nacimiento o parto por cesárea es el parto quirúrgico de un bebé a través de una incisión en el abdomen y el útero. Se lo puede llamar incisión cesárea. Este procedimiento se puede programar con anticipación o se puede realizar en una situación de emergencia. °Informe al médico acerca de lo siguiente: °· Cualquier alergia que tenga. °· Todos los medicamentos que utiliza, incluidos vitaminas, hierbas, gotas oftálmicas, cremas y medicamentos de venta libre. °· Cualquier problema previo que usted o los miembros de su familia hayan tenido con anestésicos. °· Enfermedades de la sangre que tenga. °· Cirugías previas. °· Cualquier enfermedad que tenga. °· Si usted o algún familiar tiene antecedentes de trombosis venosa profunda (TVP) o embolia pulmonar (EP). °¿Cuáles son los riesgos? °En general, se trata de un procedimiento seguro. Sin embargo, pueden ocurrir complicaciones, por ejemplo: °· Una infección. °· Hemorragia. °· Reacciones alérgicas a los medicamentos. °· Daños a otras estructuras u otros órganos. °· Coágulos sanguíneos. °· Lesiones al bebé. ° °¿Qué ocurre antes del procedimiento? °· Siga las indicaciones del médico respecto de las restricciones para las comidas o las bebidas. °· Siga las indicaciones del médico respecto de bañarse antes del procedimiento para ayudar a reducir el riesgo de infección. °· Si sabe que va a tener un parto por cesárea, no se rasure la zona púbica. Rasurarse antes del procedimiento puede aumentar el riesgo de infección. °· Consulte al médico si debe hacer o no lo siguiente: °? Cambiar o suspender los medicamentos que toma habitualmente. Esto es muy importante si toma medicamentos para la diabetes o anticoagulantes. °? El plan para controlar el dolor. Esto es muy importante si piensa amamantar a su bebé. °? Cuánto tiempo permanecerá en el hospital después del procedimiento. °? Cualquier inquietud que pueda tener sobre recibir hemoderivados si  los necesita durante el procedimiento. °? Almacenamiento de sangre del cordón, si planea guardar la sangre del cordón umbilical del bebé. °· Quizá también desee preguntarle al médico lo siguiente: °? Si va a poder sostener o amamantar a su bebé mientras aún se encuentre en el quirófano. °? Si su bebé puede quedarse con usted inmediatamente después del procedimiento y durante su recuperación. °? Si un familiar o la persona que usted elija puede ingresar al quirófano y permanecer con usted durante el procedimiento, inmediatamente después de este y durante su recuperación. °· Haga arreglos para que alguien la lleve a su casa cuando le den el alta hospitalaria. °¿Qué ocurre durante el procedimiento? °· Se le colocarán monitores fetales en el abdomen para controlar la frecuencia cardíaca de su bebé y la suya. °· Según el motivo del parto por cesárea, es posible que se le realice un examen físico o una prueba adicional, como una ecografía. °· Le colocarán una vía intravenosa (IV) en una de las venas. °· Posiblemente le realicen un análisis de sangre u orina. °· Se le administrarán antibióticos para ayudar a prevenir las infecciones. °· Se le puede entregar una bata especial de calentamiento para mantener estable la temperatura corporal. °· Pueden rasurarle a zona púbica. °· Le limpiarán la piel de la zona púbica y de la parte inferior del abdomen con una solución para destruir las bacterias (antiséptico). °· Se le puede insertar un catéter en la vejiga a través de la uretra. Esto se hace para drenar la orina durante el procedimiento. °· Pueden administrarle uno o más de los siguientes medicamentos: °? Un medicamento para adormecer la zona (anestesia local). °? Un medicamento que lo hará   dormir (anestesia general). °? Un medicamento (anestesia regional) que se le inyecta en la espalda o a través de un tubo pequeño y delgado que se le coloca en la espalda (anestesia raquídea o anestesia epidural). Esto adormece la parte del  cuerpo que está por debajo del lugar de la inyección y le permite permanecer despierta durante el procedimiento. Si llega a sentir náuseas, dígaselo al médico. Tendrá medicamentos disponibles para ayudarla a reducir cualquier náusea que pueda sentir. °· Le harán una incisión en el abdomen y luego en el útero. °· Si está despierta durante el procedimiento, puede sentir tirones en el abdomen, pero no debería sentir dolor. Si siente dolor, dígaselo al médico inmediatamente. °· Se sacará al bebé del útero. Es posible que sienta más presión o un tirón, mientras esto sucede. °· Inmediatamente después del parto, se secará al bebé y se lo mantendrá caliente. Podrá sostener y amamantar a su bebé. Durante este momento, es posible que se pince y corte el cordón umbilical. °· Se le extraerá la placenta del útero. °· Las incisiones se cerrarán con puntos (suturas). Es posible que se apliquen grapas, pegamento para la piel o tiras adhesivas en la incisión del abdomen. °· Se colocarán vendas (vendajes) sobre la incisión del abdomen. °Este procedimiento puede variar según el médico y el hospital. °¿Qué sucede después del procedimiento? °· Le controlarán con frecuencia la presión arterial, la frecuencia cardíaca, la frecuencia respiratoria y el nivel de oxígeno en la sangre hasta que haya desaparecido el efecto de los medicamentos administrados. °· Puede seguir recibiendo líquidos o medicamentos por la vía intravenosa. °· Sentirá un poco de dolor. Tendrá analgésicos disponibles para ayudarla a controlar el dolor. °· Para evitar la formación de coágulos sanguíneos: °? Se le pueden administrar medicamentos. °? Quizás deba usar medias o dispositivos de compresión. °? Se le indicará que camine cuando pueda hacerlo. °· El personal del hospital alentará y apoyará que cree lazos con su bebé. En el hospital, se le puede permitir que el bebé permanezca en la misma habitación que usted (internación conjunta) durante la hospitalización para  fomentar un amamantamiento exitoso. °· Se le puede sugerir que tosa y respire profundamente con frecuencia. Esto ayuda a evitar problemas pulmonares. °· Si tiene un catéter que le drena la orina, se le quitará lo antes posible después del procedimiento. °Esta información no tiene como fin reemplazar el consejo del médico. Asegúrese de hacerle al médico cualquier pregunta que tenga. °Document Released: 11/11/2005 Document Revised: 02/10/2017 Document Reviewed: 08/22/2015 °Elsevier Interactive Patient Education © 2018 [employer] ° °

## 2018-07-20 NOTE — Progress Notes (Signed)

## 2018-07-20 NOTE — Progress Notes (Signed)
Interpreter Jamesetta OrleansEricka present for encounter. Pt had MAU visit on 8/24 due to abdominal pain & decreased fetal movement. Pt states baby is still moving however is still less than 1 week ago.  Repeat C/S scheduled on 9/3.

## 2018-07-20 NOTE — Progress Notes (Signed)
Reactive NST   PRENATAL VISIT NOTE  Subjective:  Maria Daniel is a [age] yB[age] Lyons.o. [id number] at 7285w0d being seen today for ongoing prenatal care.  She is currently monitored for the following issues for this high-risk pregnancy and has H/O: C-section; Borderline hypertension; Thyroid disease; Chronic hypertension during pregnancy, antepartum; Non compliance w medication regimen; Hyperthyroidism; H/O gestational diabetes in prior pregnancy, currently pregnant; Supervision of high risk pregnancy, antepartum, third trimester; and Language barrier on their problem list.  Patient reports legs swelling.  Contractions: Irregular. Vag. Bleeding: None.  Movement: (!) Decreased. Denies leaking of fluid.   The following portions of the patient's history were reviewed and updated as appropriate: allergies, current medications, past family history, past medical history, past social history, past surgical history and problem list. Problem list updated.  Objective:   Vitals:   07/20/18 1455  BP: 133/87  Pulse: 90  Weight: 239 lb 14.4 oz (108.8 kg)    Fetal Status: Fetal Heart Rate (bpm): NST   Movement: (!) Decreased     General:  Alert, oriented and cooperative. Patient is in no acute distress.  Skin: Skin is warm and dry. No rash noted.   Cardiovascular: Normal heart rate noted  Respiratory: Normal respiratory effort, no problems with respiration noted  Abdomen: Soft, gravid, appropriate for gestational age.  Pain/Pressure: Present     Pelvic: Cervical exam deferred        Extremities: Normal range of motion.     Mental Status: Normal mood and affect. Normal behavior. Normal judgment and thought content.   Assessment and Plan:  Pregnancy: [id number] at 4185w0d  1. Supervision of high risk pregnancy, antepartum, third trimester Reactive NST  2. Hyperthyroidism Normal TFT  3. Chronic hypertension during pregnancy, antepartum BP is not elevated  4. H/O: C-section Repeat in 8 days and requests  BTL  5. Language barrier Spanish interpreter utilized today  Term labor symptoms and general obstetric precautions including but not limited to vaginal bleeding, contractions, leaking of fluid and fetal movement were reviewed in detail with the patient. Please refer to After Visit Summary for other counseling recommendations.  Return in about 3 weeks (around 08/10/2018) for wound check - C/S on 9/3.  No future appointments.  Scheryl DarterJames Arnold, MD

## 2018-07-24 ENCOUNTER — Encounter (HOSPITAL_COMMUNITY)
Admission: RE | Admit: 2018-07-24 | Discharge: 2018-07-24 | Disposition: A | Discharge status: Home / Self Care | Payer: Medicaid Other | Source: Ambulatory Visit | Attending: Obstetrics and Gynecology | Admitting: Obstetrics and Gynecology

## 2018-07-25 LAB — TYPE AND SCREEN
ABO/RH(D): O POS
Antibody Screen: NEGATIVE

## 2018-07-25 LAB — CBC
HCT: 38 % (ref 36.0–46.0)
Hemoglobin: 11.9 g/dL — ABNORMAL LOW (ref 12.0–15.0)
MCH: 24.5 pg — AB (ref 26.0–34.0)
MCHC: 31.3 g/dL (ref 30.0–36.0)
MCV: 78.4 fL (ref 78.0–100.0)
PLATELETS: 327 10*3/uL (ref 150–400)
RBC: 4.85 MIL/uL (ref 3.87–5.11)
RDW: 15.6 % — ABNORMAL HIGH (ref 11.5–15.5)
WBC: 10.9 10*3/uL — ABNORMAL HIGH (ref 4.0–10.5)

## 2018-07-26 LAB — RPR: RPR: NONREACTIVE

## 2018-07-28 ENCOUNTER — Encounter (HOSPITAL_COMMUNITY)
Admission: RE | Disposition: A | Discharge status: Home / Self Care | Payer: Self-pay | Source: Home / Self Care | Attending: Obstetrics and Gynecology

## 2018-07-28 ENCOUNTER — Inpatient Hospital Stay (HOSPITAL_COMMUNITY): Payer: Medicaid Other | Admitting: Registered Nurse

## 2018-07-28 ENCOUNTER — Other Ambulatory Visit: Payer: Self-pay

## 2018-07-28 ENCOUNTER — Encounter (HOSPITAL_COMMUNITY): Payer: Self-pay | Admitting: *Deleted

## 2018-07-28 ENCOUNTER — Inpatient Hospital Stay (HOSPITAL_COMMUNITY)
Admission: RE | Admit: 2018-07-28 | Discharge: 2018-07-31 | DRG: 784 | Disposition: A | Discharge status: Home / Self Care | Payer: Medicaid Other | Attending: Obstetrics and Gynecology | Admitting: Obstetrics and Gynecology

## 2018-07-28 DIAGNOSIS — Z302 Encounter for sterilization: Secondary | ICD-10-CM

## 2018-07-28 DIAGNOSIS — O34211 Maternal care for low transverse scar from previous cesarean delivery: Secondary | ICD-10-CM | POA: Diagnosis present

## 2018-07-28 DIAGNOSIS — O0993 Supervision of high risk pregnancy, unspecified, third trimester: Secondary | ICD-10-CM

## 2018-07-28 DIAGNOSIS — O99284 Endocrine, nutritional and metabolic diseases complicating childbirth: Secondary | ICD-10-CM | POA: Diagnosis present

## 2018-07-28 DIAGNOSIS — E0590 Thyrotoxicosis, unspecified without thyrotoxic crisis or storm: Secondary | ICD-10-CM | POA: Diagnosis present

## 2018-07-28 DIAGNOSIS — Z3A39 39 weeks gestation of pregnancy: Secondary | ICD-10-CM

## 2018-07-28 DIAGNOSIS — O99214 Obesity complicating childbirth: Secondary | ICD-10-CM | POA: Diagnosis present

## 2018-07-28 DIAGNOSIS — Z98891 History of uterine scar from previous surgery: Secondary | ICD-10-CM

## 2018-07-28 DIAGNOSIS — O1092 Unspecified pre-existing hypertension complicating childbirth: Secondary | ICD-10-CM

## 2018-07-28 DIAGNOSIS — O1002 Pre-existing essential hypertension complicating childbirth: Secondary | ICD-10-CM | POA: Diagnosis present

## 2018-07-28 LAB — CBC
HCT: 39.8 % (ref 36.0–46.0)
HEMOGLOBIN: 12.8 g/dL (ref 12.0–15.0)
MCH: 25.1 pg — AB (ref 26.0–34.0)
MCHC: 32.2 g/dL (ref 30.0–36.0)
MCV: 78.2 fL (ref 78.0–100.0)
Platelets: 343 10*3/uL (ref 150–400)
RBC: 5.09 MIL/uL (ref 3.87–5.11)
RDW: 15.8 % — AB (ref 11.5–15.5)
WBC: 23.6 10*3/uL — ABNORMAL HIGH (ref 4.0–10.5)

## 2018-07-28 LAB — CREATININE, SERUM
CREATININE: 0.5 mg/dL (ref 0.44–1.00)
GFR calc Af Amer: >60 mL/min (ref >60)
GFR calc non Af Amer: >60 mL/min (ref >60)

## 2018-07-28 LAB — TYPE AND SCREEN
ABO/RH(D): O POS
Antibody Screen: NEGATIVE

## 2018-07-28 SURGERY — Surgical Case
Anesthesia: Spinal | Wound class: Clean Contaminated

## 2018-07-28 MED ORDER — METOCLOPRAMIDE HCL 5 MG/ML IJ SOLN: 10.0000 mg | Freq: Once | INTRAMUSCULAR | Status: DC | PRN

## 2018-07-28 MED ORDER — LACTATED RINGERS IV SOLN: INTRAVENOUS | Status: DC

## 2018-07-28 MED ORDER — SODIUM CHLORIDE 0.9% FLUSH: 3.0000 mL | INTRAVENOUS | Status: DC | PRN

## 2018-07-28 MED ORDER — PRENATAL MULTIVITAMIN CH
1.0000 | ORAL_TABLET | Freq: Every day | ORAL | Status: DC
  Administered 2018-07-29 – 2018-07-31 (×3): 1 via ORAL
  Filled 2018-07-28 (×4): qty 1

## 2018-07-28 MED ORDER — LABETALOL HCL 5 MG/ML IV SOLN: 80.0000 mg | INTRAVENOUS | Status: DC | PRN

## 2018-07-28 MED ORDER — KETOROLAC TROMETHAMINE 30 MG/ML IJ SOLN
INTRAMUSCULAR | Status: AC
  Filled 2018-07-28: qty 1

## 2018-07-28 MED ORDER — NALBUPHINE HCL 10 MG/ML IJ SOLN: 5.0000 mg | Freq: Once | INTRAMUSCULAR | Status: DC | PRN

## 2018-07-28 MED ORDER — LABETALOL HCL 5 MG/ML IV SOLN: 40.0000 mg | INTRAVENOUS | Status: DC | PRN

## 2018-07-28 MED ORDER — ONDANSETRON HCL 4 MG/2ML IJ SOLN
INTRAMUSCULAR | Status: AC
  Filled 2018-07-28: qty 2

## 2018-07-28 MED ORDER — OXYTOCIN 40 UNITS IN LACTATED RINGERS INFUSION - SIMPLE MED: 2.5000 [IU]/h | INTRAVENOUS | Status: AC

## 2018-07-28 MED ORDER — NALBUPHINE HCL 10 MG/ML IJ SOLN: 5.0000 mg | INTRAMUSCULAR | Status: DC | PRN

## 2018-07-28 MED ORDER — NALOXONE HCL 4 MG/10ML IJ SOLN
1.0000 ug/kg/h | INTRAVENOUS | Status: DC | PRN
  Filled 2018-07-28: qty 5

## 2018-07-28 MED ORDER — BUPIVACAINE IN DEXTROSE 0.75-8.25 % IT SOLN
INTRATHECAL | Status: DC | PRN
  Administered 2018-07-28: 10.5 mg via INTRATHECAL

## 2018-07-28 MED ORDER — OXYCODONE HCL 5 MG PO TABS
10.0000 mg | ORAL_TABLET | ORAL | Status: DC | PRN
  Administered 2018-07-30 – 2018-07-31 (×3): 10 mg via ORAL
  Filled 2018-07-28 (×3): qty 2

## 2018-07-28 MED ORDER — SIMETHICONE 80 MG PO CHEW
80.0000 mg | CHEWABLE_TABLET | ORAL | Status: DC | PRN
  Administered 2018-07-30 – 2018-07-31 (×2): 80 mg via ORAL
  Filled 2018-07-28 (×5): qty 1

## 2018-07-28 MED ORDER — COCONUT OIL OIL
1.0000 "application " | TOPICAL_OIL | Status: DC | PRN
  Filled 2018-07-28: qty 120

## 2018-07-28 MED ORDER — LACTATED RINGERS IV SOLN
INTRAVENOUS | Status: DC
  Administered 2018-07-28 – 2018-07-29 (×2): via INTRAVENOUS

## 2018-07-28 MED ORDER — OXYTOCIN 10 UNIT/ML IJ SOLN
INTRAVENOUS | Status: DC | PRN
  Administered 2018-07-28: 40 [IU] via INTRAVENOUS

## 2018-07-28 MED ORDER — ONDANSETRON HCL 4 MG/2ML IJ SOLN
INTRAMUSCULAR | Status: DC | PRN
  Administered 2018-07-28: 4 mg via INTRAVENOUS

## 2018-07-28 MED ORDER — MENTHOL 3 MG MT LOZG
1.0000 | LOZENGE | OROMUCOSAL | Status: DC | PRN
  Filled 2018-07-28: qty 9

## 2018-07-28 MED ORDER — LACTATED RINGERS IV SOLN
INTRAVENOUS | Status: DC
  Administered 2018-07-28 (×3): via INTRAVENOUS

## 2018-07-28 MED ORDER — EPHEDRINE 5 MG/ML INJ
INTRAVENOUS | Status: AC
  Filled 2018-07-28: qty 10

## 2018-07-28 MED ORDER — MORPHINE SULFATE (PF) 0.5 MG/ML IJ SOLN
INTRAMUSCULAR | Status: DC | PRN
  Administered 2018-07-28: .15 mg via INTRATHECAL

## 2018-07-28 MED ORDER — DIBUCAINE 1 % RE OINT
1.0000 "application " | TOPICAL_OINTMENT | RECTAL | Status: DC | PRN
  Filled 2018-07-28: qty 28

## 2018-07-28 MED ORDER — METHIMAZOLE 5 MG PO TABS
5.0000 mg | ORAL_TABLET | Freq: Every day | ORAL | Status: DC
  Administered 2018-07-29 – 2018-07-31 (×3): 5 mg via ORAL
  Filled 2018-07-28 (×4): qty 1

## 2018-07-28 MED ORDER — DEXAMETHASONE SODIUM PHOSPHATE 10 MG/ML IJ SOLN
INTRAMUSCULAR | Status: AC
  Filled 2018-07-28: qty 1

## 2018-07-28 MED ORDER — DEXAMETHASONE SODIUM PHOSPHATE 10 MG/ML IJ SOLN
INTRAMUSCULAR | Status: DC | PRN
  Administered 2018-07-28: 5 mg via INTRAVENOUS

## 2018-07-28 MED ORDER — DIPHENHYDRAMINE HCL 50 MG/ML IJ SOLN: 12.5000 mg | INTRAMUSCULAR | Status: DC | PRN

## 2018-07-28 MED ORDER — MEPERIDINE HCL 25 MG/ML IJ SOLN: 6.2500 mg | INTRAMUSCULAR | Status: DC | PRN

## 2018-07-28 MED ORDER — FENTANYL CITRATE (PF) 100 MCG/2ML IJ SOLN: 25.0000 ug | INTRAMUSCULAR | Status: DC | PRN

## 2018-07-28 MED ORDER — METHIMAZOLE 5 MG PO TABS: 5.0000 mg | ORAL_TABLET | Freq: Two times a day (BID) | ORAL | Status: DC

## 2018-07-28 MED ORDER — HYDRALAZINE HCL 20 MG/ML IJ SOLN: 10.0000 mg | INTRAMUSCULAR | Status: DC | PRN

## 2018-07-28 MED ORDER — WITCH HAZEL-GLYCERIN EX PADS: 1.0000 "application " | MEDICATED_PAD | CUTANEOUS | Status: DC | PRN

## 2018-07-28 MED ORDER — OXYCODONE HCL 5 MG PO TABS
5.0000 mg | ORAL_TABLET | ORAL | Status: DC | PRN
  Administered 2018-07-29 – 2018-07-30 (×3): 5 mg via ORAL
  Filled 2018-07-28 (×3): qty 1

## 2018-07-28 MED ORDER — SCOPOLAMINE 1 MG/3DAYS TD PT72
MEDICATED_PATCH | TRANSDERMAL | Status: AC
  Filled 2018-07-28: qty 1

## 2018-07-28 MED ORDER — TETANUS-DIPHTH-ACELL PERTUSSIS 5-2.5-18.5 LF-MCG/0.5 IM SUSP
0.5000 mL | Freq: Once | INTRAMUSCULAR | Status: DC
  Filled 2018-07-28: qty 0.5

## 2018-07-28 MED ORDER — ACETAMINOPHEN 325 MG PO TABS: 650.0000 mg | ORAL_TABLET | ORAL | Status: DC | PRN

## 2018-07-28 MED ORDER — MORPHINE SULFATE (PF) 0.5 MG/ML IJ SOLN
INTRAMUSCULAR | Status: AC
  Filled 2018-07-28: qty 10

## 2018-07-28 MED ORDER — SCOPOLAMINE 1 MG/3DAYS TD PT72
1.0000 | MEDICATED_PATCH | Freq: Once | TRANSDERMAL | Status: AC
  Administered 2018-07-28: 1.5 mg via TRANSDERMAL

## 2018-07-28 MED ORDER — PHENYLEPHRINE 8 MG IN D5W 100 ML (0.08MG/ML) PREMIX OPTIME
INJECTION | INTRAVENOUS | Status: DC | PRN
  Administered 2018-07-28: 60 ug/min via INTRAVENOUS

## 2018-07-28 MED ORDER — CEFAZOLIN SODIUM-DEXTROSE 2-4 GM/100ML-% IV SOLN
2.0000 g | INTRAVENOUS | Status: AC
  Administered 2018-07-28: 2 g via INTRAVENOUS

## 2018-07-28 MED ORDER — IBUPROFEN 600 MG PO TABS
600.0000 mg | ORAL_TABLET | Freq: Four times a day (QID) | ORAL | Status: DC
  Administered 2018-07-28 – 2018-07-31 (×10): 600 mg via ORAL
  Filled 2018-07-28 (×11): qty 1

## 2018-07-28 MED ORDER — CHLOROPROCAINE HCL (PF) 3 % IJ SOLN
INTRAMUSCULAR | Status: AC
  Filled 2018-07-28: qty 20

## 2018-07-28 MED ORDER — SOD CITRATE-CITRIC ACID 500-334 MG/5ML PO SOLN
30.0000 mL | ORAL | Status: AC
  Administered 2018-07-28: 30 mL via ORAL
  Filled 2018-07-28: qty 15

## 2018-07-28 MED ORDER — ENOXAPARIN SODIUM 60 MG/0.6ML ~~LOC~~ SOLN
60.0000 mg | SUBCUTANEOUS | Status: DC
  Administered 2018-07-29 – 2018-07-31 (×3): 60 mg via SUBCUTANEOUS
  Filled 2018-07-28 (×4): qty 0.6

## 2018-07-28 MED ORDER — EPHEDRINE SULFATE-NACL 50-0.9 MG/10ML-% IV SOSY
PREFILLED_SYRINGE | INTRAVENOUS | Status: DC | PRN
  Administered 2018-07-28: 20 mg via INTRAVENOUS

## 2018-07-28 MED ORDER — FENTANYL CITRATE (PF) 100 MCG/2ML IJ SOLN
INTRAMUSCULAR | Status: AC
  Filled 2018-07-28: qty 2

## 2018-07-28 MED ORDER — SENNOSIDES-DOCUSATE SODIUM 8.6-50 MG PO TABS
2.0000 | ORAL_TABLET | ORAL | Status: DC
  Administered 2018-07-28 – 2018-07-30 (×3): 2 via ORAL
  Filled 2018-07-28 (×5): qty 2

## 2018-07-28 MED ORDER — LABETALOL HCL 5 MG/ML IV SOLN
20.0000 mg | INTRAVENOUS | Status: DC | PRN
  Administered 2018-07-28: 20 mg via INTRAVENOUS

## 2018-07-28 MED ORDER — SIMETHICONE 80 MG PO CHEW
80.0000 mg | CHEWABLE_TABLET | ORAL | Status: DC
  Administered 2018-07-28: 80 mg via ORAL
  Filled 2018-07-28 (×3): qty 1

## 2018-07-28 MED ORDER — SODIUM CHLORIDE 0.9 % IR SOLN
Status: DC | PRN
  Administered 2018-07-28: 1

## 2018-07-28 MED ORDER — OXYTOCIN 10 UNIT/ML IJ SOLN
INTRAMUSCULAR | Status: AC
  Filled 2018-07-28: qty 4

## 2018-07-28 MED ORDER — PHENYLEPHRINE 8 MG IN D5W 100 ML (0.08MG/ML) PREMIX OPTIME
INJECTION | INTRAVENOUS | Status: AC
  Filled 2018-07-28: qty 100

## 2018-07-28 MED ORDER — LABETALOL HCL 5 MG/ML IV SOLN
INTRAVENOUS | Status: AC
  Filled 2018-07-28: qty 4

## 2018-07-28 MED ORDER — DIPHENHYDRAMINE HCL 25 MG PO CAPS
25.0000 mg | ORAL_CAPSULE | Freq: Four times a day (QID) | ORAL | Status: DC | PRN
  Filled 2018-07-28: qty 1

## 2018-07-28 MED ORDER — KETOROLAC TROMETHAMINE 30 MG/ML IJ SOLN
30.0000 mg | Freq: Four times a day (QID) | INTRAMUSCULAR | Status: DC | PRN
  Administered 2018-07-28: 30 mg via INTRAMUSCULAR

## 2018-07-28 MED ORDER — ACETAMINOPHEN 500 MG PO TABS
1000.0000 mg | ORAL_TABLET | Freq: Four times a day (QID) | ORAL | Status: AC
  Administered 2018-07-28 – 2018-07-29 (×3): 1000 mg via ORAL
  Filled 2018-07-28 (×3): qty 2

## 2018-07-28 MED ORDER — ONDANSETRON HCL 4 MG/2ML IJ SOLN: 4.0000 mg | Freq: Three times a day (TID) | INTRAMUSCULAR | Status: DC | PRN

## 2018-07-28 MED ORDER — NALOXONE HCL 0.4 MG/ML IJ SOLN: 0.4000 mg | INTRAMUSCULAR | Status: DC | PRN

## 2018-07-28 MED ORDER — DIPHENHYDRAMINE HCL 25 MG PO CAPS
25.0000 mg | ORAL_CAPSULE | ORAL | Status: DC | PRN
  Filled 2018-07-28: qty 1

## 2018-07-28 MED ORDER — FENTANYL CITRATE (PF) 100 MCG/2ML IJ SOLN
INTRAMUSCULAR | Status: DC | PRN
  Administered 2018-07-28: 15 ug via INTRATHECAL

## 2018-07-28 MED ORDER — SIMETHICONE 80 MG PO CHEW
80.0000 mg | CHEWABLE_TABLET | Freq: Three times a day (TID) | ORAL | Status: DC
  Administered 2018-07-28 – 2018-07-29 (×2): 80 mg via ORAL
  Filled 2018-07-28 (×5): qty 1

## 2018-07-28 MED ORDER — KETOROLAC TROMETHAMINE 30 MG/ML IJ SOLN: 30.0000 mg | Freq: Four times a day (QID) | INTRAMUSCULAR | Status: DC | PRN

## 2018-07-28 SURGICAL SUPPLY — 30 items
BENZOIN TINCTURE PRP APPL 2/3 (GAUZE/BANDAGES/DRESSINGS) ×4 IMPLANT
CHLORAPREP W/TINT 26ML (MISCELLANEOUS) ×4 IMPLANT
CLAMP CORD UMBIL (MISCELLANEOUS) IMPLANT
CLOSURE STERI STRIP 1/2 X4 (GAUZE/BANDAGES/DRESSINGS) ×4 IMPLANT
DRSG OPSITE POSTOP 4X10 (GAUZE/BANDAGES/DRESSINGS) ×4 IMPLANT
ELECT REM PT RETURN 9FT ADLT (ELECTROSURGICAL) ×4
ELECTRODE REM PT RTRN 9FT ADLT (ELECTROSURGICAL) ×2 IMPLANT
EXTRACTOR VACUUM M CUP 4 TUBE (SUCTIONS) IMPLANT
EXTRACTOR VACUUM M CUP 4' TUBE (SUCTIONS)
GLOVE BIOGEL PI IND STRL 6.5 (GLOVE) ×2 IMPLANT
GLOVE BIOGEL PI IND STRL 7.0 (GLOVE) ×2 IMPLANT
GLOVE BIOGEL PI INDICATOR 6.5 (GLOVE) ×2
GLOVE BIOGEL PI INDICATOR 7.0 (GLOVE) ×2
GLOVE SURG SS PI 6.0 STRL IVOR (GLOVE) ×4 IMPLANT
GOWN STRL REUS W/TWL LRG LVL3 (GOWN DISPOSABLE) ×8 IMPLANT
KIT ABG SYR 3ML LUER SLIP (SYRINGE) IMPLANT
KIT PREVENA INCISION MGT20CM45 (CANNISTER) ×4 IMPLANT
NEEDLE HYPO 25X5/8 SAFETYGLIDE (NEEDLE) IMPLANT
NS IRRIG 1000ML POUR BTL (IV SOLUTION) ×4 IMPLANT
PACK C SECTION WH (CUSTOM PROCEDURE TRAY) ×4 IMPLANT
PAD OB MATERNITY 4.3X12.25 (PERSONAL CARE ITEMS) ×4 IMPLANT
PENCIL SMOKE EVAC W/HOLSTER (ELECTROSURGICAL) ×4 IMPLANT
RTRCTR C-SECT PINK 25CM LRG (MISCELLANEOUS) IMPLANT
SEPRAFILM MEMBRANE 5X6 (MISCELLANEOUS) IMPLANT
SUT MON AB 4-0 PS1 27 (SUTURE) ×4 IMPLANT
SUT PLAIN 0 NONE (SUTURE) ×4 IMPLANT
SUT VIC AB 0 CT1 36 (SUTURE) ×16 IMPLANT
SUT VIC AB 4-0 KS 27 (SUTURE) ×4 IMPLANT
TOWEL OR 17X24 6PK STRL BLUE (TOWEL DISPOSABLE) ×4 IMPLANT
TRAY FOLEY W/BAG SLVR 14FR LF (SET/KITS/TRAYS/PACK) ×4 IMPLANT

## 2018-07-28 NOTE — H&P (Signed)
Maria Daniel is a 37 y.o. female [phone] at [redacted]w[redacted]d presenting for scheduled repeat cesarean section. Patient with prenatal care at [facility] complicated by hyperthyroidism on methimazole, CHTN on labetalol and previous cesarean section x 3.  Patient reports feeling well and experiencing good fetal movement. She denies leakage of fluid, vaginal bleeding or contractions.  OB History    Gravida  6   Para  3   Term  3   Preterm      AB  2   Living  3     SAB  2   TAB      Ectopic      Multiple  0   Live Births  3          Past Medical History:  Diagnosis Date  . Complication of anesthesia    had trouble with spinal not working with last cs does not want a learner to do spinal  . Diabetes mellitus without complication (HCC)   . Dyspnea   . Gestational diabetes   . Hypertension    no meds patient denies  . Hyperthyroidism   . Insomnia   . Knee pain   . Miscarriage    Past Surgical History:  Procedure Laterality Date  . CESAREAN SECTION    . CESAREAN SECTION  08/17/2012   Procedure: CESAREAN SECTION;  Surgeon: Lesly Dukes, MD;  Location: [facility];  Service: Obstetrics;  Laterality: N/A;  . CESAREAN SECTION N/A 04/23/2016   Procedure: CESAREAN SECTION;  Surgeon: Catalina Antigua, MD;  Location: [facility];  Service: Obstetrics;  Laterality: N/A;  . LESION REMOVAL N/A 11/11/2016   Procedure: EXCISION OF SUBCUTANEOUS LESION/ ABDOMEN;  Surgeon: Hermina Staggers, MD;  Location: [facility];  Service: Gynecology;  Laterality: N/A;   Family History: family history includes Hypertension in her mother; Kidney disease in her father; Multiple sclerosis in her father. Social History:  reports that she has never smoked. She has never used smokeless tobacco. She reports that she does not drink alcohol or use drugs.     Maternal Diabetes: No Genetic Screening: Declined Maternal Ultrasounds/Referrals: Normal Fetal Ultrasounds or other Referrals:  None Maternal Substance  Abuse:  No Significant Maternal Medications:  Meds include: Other:  Methimazole and labetalol Significant Maternal Lab Results:  None Other Comments:  None  ROS  See pertinent in HPI History   Blood pressure 116/68, pulse 83, temperature 98.7 F (37.1 C), temperature source Oral, resp. rate 20, height 5\' 4"  (1.626 m), weight 109.9 kg, last menstrual period 10/10/2017, unknown if currently breastfeeding. Exam Physical Exam  GENERAL: Well-developed, well-nourished female in no acute distress.  LUNGS: Clear to auscultation bilaterally.  HEART: Regular rate and rhythm. ABDOMEN: Soft, nontender, nondistended. No organomegaly. PELVIC: Not indicated EXTREMITIES: No cyanosis, clubbing, or edema, 2+ distal pulses.  Prenatal labs: ABO, Rh: --/--/O POS (08/31 1320) Antibody: NEG (08/31 1320) Rubella: 29.10 (05/01 1201) RPR: Non Reactive (08/31 1320)  HBsAg: Negative (05/01 1201)  HIV: Non Reactive (06/03 1753)  GBS:   negative  Assessment/Plan: 37 yo [id number] at [redacted]w[redacted]d here for schedule repeat cesarean section - Risks, benefits and alternatives were explained including but not limited to risks of bleeding, infection and damage to adjacent organs. Patient verbalized understanding and all questions were answered - Patient also desires permanent sterilization. Risks and benefits of procedure discussed with patient including permanence of method, bleeding, infection, injury to surrounding organs and need for additional procedures. Risk failure of 0.5-1% with increased risk of ectopic gestation  if pregnancy occurs was also discussed with patient. Patient is self pay and understands that she may be billed for the sterilization procedure    Caroline Longie 07/28/2018, 11:41 AM

## 2018-07-28 NOTE — Anesthesia Procedure Notes (Signed)
Spinal  Patient location during procedure: OR Staffing Anesthesiologist: Lilyth Lawyer, MD Performed: anesthesiologist  Preanesthetic Checklist Completed: patient identified, site marked, surgical consent, pre-op evaluation, timeout performed, IV checked, risks and benefits discussed and monitors and equipment checked Spinal Block Patient position: sitting Prep: DuraPrep Patient monitoring: heart rate, continuous pulse ox and blood pressure Approach: midline Location: L3-4 Injection technique: single-shot Needle Needle type: Sprotte  Needle gauge: 24 G Needle length: 9 cm Additional Notes Expiration date of kit checked and confirmed. Patient tolerated procedure well, without complications.       

## 2018-07-28 NOTE — Anesthesia Preprocedure Evaluation (Signed)
Anesthesia Evaluation  Patient identified by MRN, date of birth, ID band Patient awake    Reviewed: Allergy & Precautions, NPO status , Patient's Chart, lab work & pertinent test results  Airway Mallampati: II  TM Distance: >3 FB Neck ROM: Full    Dental no notable dental hx.    Pulmonary neg pulmonary ROS,    Pulmonary exam normal breath sounds clear to auscultation       Cardiovascular hypertension, Normal cardiovascular exam Rhythm:Regular Rate:Normal     Neuro/Psych negative neurological ROS  negative psych ROS   GI/Hepatic negative GI ROS, Neg liver ROS,   Endo/Other  diabetes, Gestational, Oral Hypoglycemic AgentsMorbid obesity  Renal/GU negative Renal ROS  negative genitourinary   Musculoskeletal negative musculoskeletal ROS (+)   Abdominal   Peds negative pediatric ROS (+)  Hematology negative hematology ROS (+)   Anesthesia Other Findings   Reproductive/Obstetrics (+) Pregnancy                             Anesthesia Physical  Anesthesia Plan  ASA: III  Anesthesia Plan: Spinal   Post-op Pain Management:    Induction:   PONV Risk Score and Plan:   Airway Management Planned: Natural Airway  Additional Equipment:   Intra-op Plan:   Post-operative Plan:   Informed Consent: I have reviewed the patients History and Physical, chart, labs and discussed the procedure including the risks, benefits and alternatives for the proposed anesthesia with the patient or authorized representative who has indicated his/her understanding and acceptance.   Dental advisory given  Plan Discussed with: CRNA  Anesthesia Plan Comments:         Anesthesia Quick Evaluation

## 2018-07-28 NOTE — Op Note (Addendum)
Maria Daniel PROCEDURE DATE: 07/28/2018  PREOPERATIVE DIAGNOSIS: Intrauterine pregnancy at  [redacted]w[redacted]d weeks gestation; previous uterine incision kerr x3 or greater and desire for permanent sterilization  POSTOPERATIVE DIAGNOSIS: The same  PROCEDURE:     Cesarean Section and Bilateral Tubal Sterilization using Pomeroy method   SURGEON:  Dr. Catalina Antigua  ASSISTANT: Dr. Marcy Siren  INDICATIONS: Maria Daniel is a 37 y.o. L2G4010 at [redacted]w[redacted]d scheduled for cesarean section and bilateral tubal ligation secondary to previous uterine incision kerr x3 or greater and desire for permanent sterilization.  The risks of cesarean section discussed with the patient included but were not limited to: bleeding which may require transfusion or reoperation; infection which may require antibiotics; injury to bowel, bladder, ureters or other surrounding organs; injury to the fetus; need for additional procedures including hysterectomy in the event of a life-threatening hemorrhage; placental abnormalities wth subsequent pregnancies, incisional problems, thromboembolic phenomenon and other postoperative/anesthesia complications. The patient also desires permanent sterilization. Risks and benefits of procedure discussed with patient including permanence of method, bleeding, infection, injury to surrounding organs and need for additional procedures. Risk failure of 0.5-1% with increased risk of ectopic gestation if pregnancy occurs was also discussed with patient.The patient concurred with the proposed plan, giving informed written consent for the procedure.    FINDINGS:  Viable female infant in cephalic presentation.  Apgars 9 and 9.  Clear amniotic fluid.  Intact placenta, three vessel cord.  Normal uterus, fallopian tubes and ovaries bilaterally. Minimal adhesions  ANESTHESIA:    Spinal INTRAVENOUS FLUIDS: 2700 ml ESTIMATED BLOOD LOSS: 1034 mL ml URINE OUTPUT:  100 ml SPECIMENS: Placenta sent to  L&D. Portions of right and left fallopian tubes sent to pathology COMPLICATIONS: None immediate  PROCEDURE IN DETAIL:  The patient received intravenous antibiotics and had sequential compression devices applied to her lower extremities while in the preoperative area.  She was then taken to the operating room where anesthesia was induced and was found to be adequate. A foley catheter was placed into her bladder and attached to Maria Daniel gravity. She was then placed in a dorsal supine position with a leftward tilt, and prepped and draped in a sterile manner. After an adequate timeout was performed, a Pfannenstiel skin incision was made with scalpel and carried through to the underlying layer of fascia. The fascia was incised in the midline and this incision was extended bilaterally using the Mayo scissors. Kocher clamps were applied to the superior aspect of the fascial incision and the underlying rectus muscles were dissected off bluntly. A similar process was carried out on the inferior aspect of the facial incision. The rectus muscles were separated in the midline bluntly and the peritoneum was entered bluntly. The Alexis self-retaining retractor was introduced into the abdominal cavity. Attention was turned to the lower uterine segment where a transverse hysterotomy was made with a scalpel and extended bilaterally bluntly. The infant was successfully delivered, and cord was clamped and cut and infant was handed over to awaiting neonatology team. Uterine massage was then administered and the placenta delivered intact with three-vessel cord. The uterus was cleared of clot and debris.  The hysterotomy was closed with 0 Vicryl in a running locked fashion, and an imbricating layer was also placed with a 0 Vicryl. Overall, excellent hemostasis was noted. The patient's left fallopian tube was then identified, brought to the incision, and grasped with a Babcock clamp. The tube was then followed out to the fimbria. The  Babcock clamp was then used to grasp  the tube approximately 4 cm from the cornual region. A 3 cm segment of the tube was then ligated with free tie of plain gut suture, transected and excised. Good hemostasis was noted and the tube was returned to the abdomen. The right fallopian tube was then identified to its fimbriated end, ligated, and a 3 cm segment excised in a similar fashion. Excellent hemostasis was noted, and the tube returned to the abdomen. The pelvis copiously irrigated and cleared of all clot and debris. Hemostasis was confirmed on all surfaces.  The peritoneum and the muscles were reapproximated using 0 vicryl interrupted stitches. The fascia was then closed using 0 Vicryl in a running fashion.  The skin was closed in a subcuticular fashion using 3.0 Vicryl. Prevena wound vac was applied over the incision. The patient tolerated the procedure well. Sponge, lap, instrument and needle counts were correct x 2. She was taken to the recovery room in stable condition.    Maria Oelkers ConstantMD  07/28/2018 1:19 PM

## 2018-07-28 NOTE — Anesthesia Postprocedure Evaluation (Signed)
Anesthesia Post Note  Patient: Maria Daniel  Procedure(s) Performed: REPEAT CESAREAN SECTION (N/A ) BILATERAL TUBAL LIGATION (Bilateral )     Patient location during evaluation: Mother Baby Anesthesia Type: Spinal Level of consciousness: awake and alert and oriented Pain management: satisfactory to patient Vital Signs Assessment: post-procedure vital signs reviewed and stable Respiratory status: respiratory function stable and spontaneous breathing Cardiovascular status: blood pressure returned to baseline Postop Assessment: no headache, no backache, spinal receding, patient able to bend at knees and adequate PO intake Anesthetic complications: no    Last Vitals:  Vitals:   07/28/18 1530 07/28/18 1630  BP: 140/89 129/64  Pulse: 89 80  Resp: 17 18  Temp: 36.7 C 36.8 C  SpO2: 97% 98%    Last Pain:  Vitals:   07/28/18 1630  TempSrc: Oral  PainSc: 0-No pain   Pain Goal: Patients Stated Pain Goal: 4 (07/28/18 1111)               Payslee Bateson

## 2018-07-28 NOTE — Transfer of Care (Signed)
Immediate Anesthesia Transfer of Care Note  Patient: Maria Daniel  Procedure(s) Performed: REPEAT CESAREAN SECTION (N/A ) BILATERAL TUBAL LIGATION (Bilateral )  Patient Location: PACU  Anesthesia Type:Spinal  Level of Consciousness: awake, alert  and oriented  Airway & Oxygen Therapy: Patient Spontanous Breathing  Post-op Assessment: Report given to RN and Post -op Vital signs reviewed and stable  Post vital signs: Reviewed and stable  Last Vitals:  Vitals Value Taken Time  BP 142/88 07/28/2018  1:47 PM  Temp    Pulse 85 07/28/2018  1:49 PM  Resp 12 07/28/2018  1:49 PM  SpO2 94 % 07/28/2018  1:49 PM  Vitals shown include unvalidated device data.  Last Pain:  Vitals:   07/28/18 1111  TempSrc:   PainSc: 1       Patients Stated Pain Goal: 4 (07/28/18 1111)  Complications: No apparent anesthesia complications

## 2018-07-29 ENCOUNTER — Encounter (HOSPITAL_COMMUNITY): Payer: Self-pay | Admitting: Obstetrics and Gynecology

## 2018-07-29 ENCOUNTER — Other Ambulatory Visit: Payer: Self-pay

## 2018-07-29 ENCOUNTER — Encounter: Payer: Self-pay | Admitting: Obstetrics and Gynecology

## 2018-07-29 LAB — CBC
HCT: 31 % — ABNORMAL LOW (ref 36.0–46.0)
Hemoglobin: 10 g/dL — ABNORMAL LOW (ref 12.0–15.0)
MCH: 25.1 pg — AB (ref 26.0–34.0)
MCHC: 32.3 g/dL (ref 30.0–36.0)
MCV: 77.7 fL — AB (ref 78.0–100.0)
Platelets: 318 10*3/uL (ref 150–400)
RBC: 3.99 MIL/uL (ref 3.87–5.11)
RDW: 16.4 % — AB (ref 11.5–15.5)
WBC: 16.7 10*3/uL — AB (ref 4.0–10.5)

## 2018-07-29 NOTE — Progress Notes (Signed)
POSTPARTUM PROGRESS NOTE  Post-Op Day 1 Subjective:  Maria Daniel is a 37 y.o. [id number] [redacted]w[redacted]d s/p repeat scheduled LTCS (3rd) and BTL.  No acute events overnight.  Pt denies problems with ambulating, voiding or po intake.  She denies nausea or vomiting.  Pain is moderately controlled. Lochia Moderate.   Objective: Blood pressure (!) 104/59, pulse 62, temperature (!) 97.5 F (36.4 C), temperature source Oral, resp. rate 18, height 5\' 4"  (1.626 m), weight 109.9 kg, last menstrual period 10/10/2017, SpO2 98 %  Physical Exam:  General: alert, cooperative and no distress Chest: no respiratory distress Abdomen: soft, nontender,  Uterine Fundus: firm, appropriately tender DVT Evaluation: No calf swelling or tenderness Extremities: no edema  Recent Labs    07/28/18 1534 07/29/18 0525  HGB 12.8 10.0*  HCT 39.8 31.0*   Assessment/Plan:  ASSESSMENT: Maria Daniel is a 37 y.o. P6P9509 [redacted]w[redacted]d s/p repeat scheduled LTCS (3rd) and BTL.   - Hyperthyroid: methimazole  - HTN: labetolol has been discontinued (?); had severe range pressures during delivery but pressures have normalized  - Pain: tylenol, ibuprofen, ketorolac (DC'd), oxy 5mg  (increase oxy for post-C-section pain control?)  - Feeding: breast - Contraception: BTL occurred during C-section    LOS: 1 day   Raynelle Highland MS3 07/29/2018, 7:20 AM

## 2018-07-29 NOTE — Lactation Note (Signed)
This note was copied from a baby's chart. Lactation Consultation Note  Patient Name: Maria Daniel [id number] Date: 07/29/2018 Reason for consult: Initial assessment  Female 42 hours old .  Elevated TCBili. Rn came in to draw serum bili while in room. Mom gestational Diabetic.  Delivered via csection. None of her other children have ever had jaundice.  Mom reports she is feeding very often.  Sometimes every 30 minutes.   Infant having short feeds. Rn reports that they keep her swaddled when she feeds and that she is sleeping a lot of the time while at the breast.  Mom reports that she does not feed but about 5 minutes at the breast and then she is asleep.  Mom reports she feels like she does not have enough milk. Mom reports she plans to do both because she does not want to breastfeed when she is out.  Wants to give formula. Discussed possibly pumping and offering EBM in bottles.  She reports she wants to do formula.  Discussed making infant  uncomfortable at the breast to try and feed longer at the breast .  Urged parents to hand express and feed back all EBM past breastfeedings to top her off.  Assisted with feeding and hand Expression.  Mom able to hand express colostrum easily.  Assist with feeding.  Mom feeds in cradle hold.  She does not give infant support and she comes off and on the breast bopping her head. Attempt to demo to mom how to put some gentle pressure on her shoulders so she will feel secure and be able to stay there and feed.  Mom reports she feels like too much pressure on infants back. Mom took her off and put her on other breast.  Infant was coming off and on the breast.  Left mom and baby breastfeeding.  Used Maria Daniel for [place]  interpreter on a stick.  ID number [id number]. Gave mom Breastfeeding resources handouts and [facility] in [place]. Maternal Data Formula Feeding for Exclusion: No Has patient been taught Hand Expression?: Yes Does the  patient have breastfeeding experience prior to this delivery?: Yes  Feeding Feeding Type: Breast Fed Length of feed: 25 min  LATCH Score Latch: Repeated attempts needed to sustain latch, nipple held in mouth throughout feeding, stimulation needed to elicit sucking reflex.  Audible Swallowing: A few with stimulation  Type of Nipple: Everted at rest and after stimulation  Comfort (Breast/Nipple): Soft / non-tender  Hold (Positioning): Assistance needed to correctly position infant at breast and maintain latch.  LATCH Score: 7  Interventions Interventions: Breast feeding basics reviewed;Assisted with latch;Hand express;Expressed milk  Lactation Tools Discussed/Used     Consult Status Consult Status: Follow-up Date: 07/30/18 Follow-up type: In-patient    Maria Daniel Maria Daniel 07/29/2018, 5:01 PM

## 2018-07-29 NOTE — Progress Notes (Signed)
Nurse contacted attending physician to assess patients wound vac. Patients wound vac was full of blood and alarming. Doctor said to contact the wound vac representative to get a plan to change the wound vac. Nurse contacted Tommy the wound vac representative and he came to bedside to assist nurse with hooking up a hospital wound vac system that holds 500 mls of drainage. Tommy told nurse or physician to call if there are any questions about how the patient should be discharge with the wound vac. Tommy's number is [phone]. Please call him if there are any questions.

## 2018-07-30 NOTE — Progress Notes (Addendum)
Postpartum Day 2: Cesarean Delivery Subjective: Patient is Spanish-speaking only, Spanish interpreter present for this encounter. Patient reports doing well overall. She endorses some pain under her right breast that is reproducible with palpation and better upon sitting up. She denies headache, changes in vision, chest pain, shortness of breath, or excessive swelling in her legs. She reports that she is ambulating and urinating normally. She reports being constipated and has not had a bowel movement and is passing minimal gas since her operation. She is receiving senna-docusate for her bowels. She is breastfeeding and had a tubal ligation.  Objective: Vital signs in last 24 hours: Temp:  [97.8 F (36.6 C)-97.9 F (36.6 C)] 97.8 F (36.6 C) (09/04 1507) Pulse Rate:  [58-67] 58 (09/04 2309) Resp:  [19-20] 20 (09/04 2309) BP: (111-128)/(53-75) 124/53 (09/04 2309) SpO2:  [98 %-99 %] 99 % (09/04 1507)   Intake/Output Summary (Last 24 hours) at 07/30/2018 1413 Last data filed at 07/29/2018 1737 Gross per 24 hour  Intake -  Output 1000 ml  Net -1000 ml    Physical Exam:  General: alert and no distress Lochia: appropriate Uterine Fundus: firm Incision: healing well, wound vac with drain in place DVT Evaluation: No evidence of DVT seen on physical exam.  Recent Labs    07/28/18 1534 07/29/18 0525  HGB 12.8 10.0*  HCT 39.8 31.0*    Assessment/Plan: Maria Daniel is a 37 year-old [id number] with a history of cHTN and hyperthyroidism who is POD #2 from rLTCS and BTL. She is doing well and is in good condition. Likely that pain under right breast is MSK related given that it is reproducible on palpation. Recommended that patient inform team if her pain was worsening. Blood pressures have been mostly in normal range in the past 24 hours with no high measurements. She has not received any blood pressure medication since delivery. Methimazole was restarted yesterday. Continue current care  with monitoring of wound vac and blood pressures. Plan for discharge tomorrow.  Natasha Mead, MS3 07/30/2018, 7:51 AM    Attestation of Attending Supervision of Medical Student: Evaluation and management procedures were performed by the Medical Student under my supervision. I confirm that I have verified the information documented in the medical student's note, and that I have also personally reperformed the physical exam and all medical decision making activities. I agree with management and plan as outlined in the documentation.    Jaynie Collins, MD, FACOG Attending Obstetrician & Gynecologist Faculty Practice, [facility]

## 2018-07-31 ENCOUNTER — Ambulatory Visit: Payer: Self-pay

## 2018-07-31 ENCOUNTER — Encounter (HOSPITAL_COMMUNITY): Payer: Self-pay | Admitting: *Deleted

## 2018-07-31 MED ORDER — IBUPROFEN 600 MG PO TABS: 600.0000 mg | ORAL_TABLET | Freq: Four times a day (QID) | ORAL | 0 refills | Status: DC

## 2018-07-31 MED ORDER — METHIMAZOLE 5 MG PO TABS: 5.0000 mg | ORAL_TABLET | Freq: Two times a day (BID) | ORAL | 0 refills | Status: DC

## 2018-07-31 MED ORDER — OXYCODONE HCL 10 MG PO TABS: 10.0000 mg | ORAL_TABLET | ORAL | 0 refills | Status: DC | PRN

## 2018-07-31 MED ORDER — OXYCODONE-ACETAMINOPHEN 5-325 MG PO TABS: 1.0000 | ORAL_TABLET | ORAL | 0 refills | Status: DC | PRN

## 2018-07-31 NOTE — Discharge Instructions (Signed)
Parto por Maria Daniel, cuidados posteriores Cesarean Delivery, Care After Siga estas instrucciones durante las prximas semanas. Estas indicaciones le proporcionan informacin acerca de cmo deber cuidarse despus del procedimiento. Su mdico tambin podr darle indicaciones ms especficas. El tratamiento ha sido planificado segn las prcticas mdicas actuales, pero en algunos casos pueden ocurrir problemas. Comunquese con el mdico si tiene algn problema o preguntas despus del procedimiento. Qu puedo esperar despus del procedimiento? Despus del procedimiento, es comn DIRECTVtener los siguientes sntomas:  Una pequea cantidad de sangre o un lquido transparente que sale de la incisin.  Tiene enrojecimiento, hinchazn o dolor en la zona de la incisin.  Dolores y [employer] molestias abdominales.  Hemorragia vaginal (loquios).  Calambres plvicos.  Fatiga.  Siga estas indicaciones en su casa: Cuidados de la incisin   Siga las indicaciones del mdico acerca del cuidado de la incisin. Haga lo siguiente: ? Lvese las manos con agua y jabn antes de cambiar la venda (vendaje). Use desinfectante para manos si no dispone de [place] y [place]. ? Cambie el vendaje como se lo haya indicado el mdico. ? No retire los puntos (suturas), las grapas cutneas, la goma para cerrar la piel o las tiras Sligoadhesivas. Es posible que estos cierres cutneos Conservation officer, naturedeban permanecer en la piel durante 2semanas o ms tiempo. Si los bordes de las tiras [address]adhesivas empiezan a despegarse y Scientific laboratory technicianenroscarse, puede recortar los que estn sueltos. No retire las tiras Agilent Technologiesadhesivas por completo a menos que el mdico se lo indique.  Controle todos los das la zona de la incisin para detectar signos de infeccin. Est atenta a los siguientes signos: ? Aumento del enrojecimiento, la hinchazn o Chief Technology Officerel dolor. ? Mayor presencia de lquido o Afftonsangre. ? Calor. ? Pus o mal olor.  Cuando tosa o estornude, abrace Rockwell Automationuna almohada. Esto ayuda con el dolor y Apple Computerdisminuye  la posibilidad de que su incisin se abra (dehiscencia). Haga esto hasta que cicatrice completamente. Medicamentos  CenterPoint Energyome los medicamentos de venta libre y los recetados solamente como se lo haya indicado el mdico.  Si le recetaron un antibitico, tmelo como se lo haya indicado el mdico. No interrumpa la administracin del antibitico hasta que lo haya terminado. Conducir  No conduzca ni opere maquinaria pesada mientras toma analgsicos recetados.  No conduzca durante 24horas si le administraron un sedante. Estilo de vida  No beba alcohol. Esto es de suma importancia si est amamantando o toma analgsicos.  No consuma productos que contengan tabaco, incluidos cigarrillos, tabaco de Theatre managermascar o cigarrillos electrnicos. Si necesita ayuda para dejar de fumar, consulte al mdico. El tabaco puede retrasar la cicatrizacin. Qu debe comer y beber  Beba al menos 8vasos de ochoonzas (240cc) de agua todos los [address]  [address] a menos que el mdico le indique lo contrario. Si amamanta, quiz deba beber an ms cantidad de agua.  Coma alimentos ricos en Enbridge Energyfibras todos los das. Estos alimentos pueden ayudarla a prevenir o Educational psychologistaliviar el estreimiento. Los alimentos ricos en fibras incluyen, entre otros: ? Panes y cereales integrales. ? Arroz integral. ? Armed forces operational officerrijoles. ? Nils PyleFrutas y verduras frescas. Actividad  Retome sus actividades normales como se lo haya indicado el mdico. Pregntele al mdico qu actividades son seguras para usted.  Descanse todo lo que pueda. Trate de descansar o tomar una siesta mientras el beb duerme.  No levante objetos que pesen ms que su beb o ms de 10 libras (4,5 kg), como se lo haya indicado el mdico.  Pregntele al mdico cundo puede retomar la actividad sexual. Esto puede depender de  lo siguiente: ? Riesgo de sufrir una infeccin. ? Velocidad de cicatrizacin. ? Comodidad y deseo de [employer] sexual. Baarse  No tome baos de inmersin, no nade ni use el jacuzzi  hasta que el mdico lo autorice. Pregntele al mdico si puede ducharse. Delle Reining solo le permitan darse baos de esponja hasta que la incisin se cure.  Mantenga el vendaje seco, como se lo haya indicado el mdico. Instrucciones generales  No use tampones ni se haga duchas vaginales hasta que el mdico la autorice.  Use lo siguiente: ? Ropa cmoda y suelta. ? Un sostn firme y [place].  Controle la sangre que elimina por la vagina para detectar cogulos de [place]. Estos pueden tener el aspecto de grumos de color rojo oscuro, o secrecin marrn o negra.  Mantenga el perineo limpio y seco, como se lo haya indicado el mdico.  Cuando vaya al bao, siempre higiencese de adelante hacia atrs.  Si es posible, pdale a alguien que la ayude a cuidar de su beb y con las tareas del hogar durante [employer] despus de que le den el alta del hospital.  Chauncy Passy a todas las visitas de seguimiento para usted y el beb, como se lo haya indicado el mdico. Esto es importante. Comunquese con un mdico si:  Tiene los siguientes sntomas: ? Una secrecin vaginal con mal olor. ? Dificultad para orinar. ? Dolor al ConocoPhillips. ? Aumento o disminucin repentinos de la frecuencia de las deposiciones. ? Aumento del enrojecimiento, la hinchazn o el dolor alrededor de la incisin. ? Aumento del lquido o de la sangre que sale de la incisin. ? Pus o mal olor en Immunologist de la incisin. ? Fiebre. ? Erupcin cutnea. ? Poco inters o falta de inters en actividades que solan gustarle. ? Dudas sobre su cuidado y el del beb. ? Nuseas.  La incisin est caliente al tacto.  Siente dolor en las mamas y se ponen rojas o duras.  Siente tristeza o preocupacin de forma inusual.  Vomita.  Elimina cogulos de sangre grandes por la vagina. Si expulsa un cogulo de sangre, gurdelo para mostrrselo al [employer]. No tire la cadena sin mostrarle los cogulos de sangre a su mdico.  Orina ms de lo  habitual.  Se siente mareada o se desmaya.  No ha amamantado y no ha tenido un perodo menstrual durante 12 semanas despus del [place].  Dej de amamantar al beb y no ha tenido su perodo menstrual durante 12 semanas despus de haber dejado de Museum/gallery exhibitions officer. Solicite ayuda de inmediato si:  Tiene los siguientes sntomas: ? Dolor que no desaparece o no mejora con medicamentos. ? Journalist, newspaper. ? Dificultad para respirar. ? Visin borrosa o Nurse, adult. ? Pensamientos de autolesionarse o lesionar al beb. ? Air cabin crew abdomen o en una de las piernas. ? Dolor de cabeza intenso.  Se desmaya.  Tiene una hemorragia tan intensa de la vagina que [employer] compresas higinicas en Georgianne Fick. Esta informacin no tiene Theme park manager el consejo del mdico. Asegrese de hacerle al mdico cualquier pregunta que tenga. Document Released: 11/11/2005 Document Revised: 03/03/2017 Document Reviewed: 10/16/2015 Elsevier Interactive Patient Education  [employer].

## 2018-07-31 NOTE — Discharge Summary (Signed)
OB Discharge Summary     Patient Name: Maria Daniel DOB: Jul 07, 1981 MRN: [id number]  Date of admission: 07/28/2018 Delivering MD: Catalina Antigua   Date of discharge: 07/31/2018  Admitting diagnosis: RCS Intrauterine pregnancy: [redacted]w[redacted]d     Secondary diagnosis:  Active Problems:   S/P cesarean section  Additional problems: BTL, CHTN, hyperthyroid     Discharge diagnosis: Term Pregnancy Delivered                                                                                                Post partum procedures:  Augmentation:   Complications: Hemorrhage>1024mL  Hospital course:  Sceduled C/S   37 y.o. yo [id number] at [redacted]w[redacted]d was admitted to the hospital 07/28/2018 for scheduled cesarean section with the following indication:Elective Repeat.  Membrane Rupture Time/Date: 12:39 PM ,07/28/2018   Patient delivered a Viable infant.07/28/2018  Details of operation can be found in separate operative note.  Pateint had an uncomplicated postpartum course.  She is ambulating, tolerating a regular diet, passing flatus, and urinating well. Patient is discharged home in stable condition on  07/31/18         Physical exam  Vitals:   07/30/18 0545 07/30/18 1513 07/30/18 2035 07/31/18 0542  BP: 120/64 (!) 153/82 (!) 122/54 113/62  Pulse: 64 76 (!) 58 (!) 57  Resp: 18 17 20 16   Temp: 98 F (36.7 C) 98.5 F (36.9 C) 98.8 F (37.1 C)   TempSrc: Oral Oral Oral   SpO2: 100% 97% 99% 99%  Weight:      Height:       General: alert, cooperative and no distress Lochia: appropriate Uterine Fundus: firm Incision: Healing well with no significant drainage, No significant erythema, Dressing is clean, dry, and intact DVT Evaluation: No evidence of DVT seen on physical exam. Negative Homan's sign. No cords or calf tenderness. Labs: Lab Results  Component Value Date   WBC 16.7 (H) 07/29/2018   HGB 10.0 (L) 07/29/2018   HCT 31.0 (L) 07/29/2018   MCV 77.7 (L) 07/29/2018   PLT 318 07/29/2018    CMP Latest Ref Rng & Units 07/28/2018  Glucose 65 - 99 mg/dL -  BUN 6 - 20 mg/dL -  Creatinine 1.24 - 5.80 mg/dL 9.98  Sodium 338 - 250 mmol/L -  Potassium 3.5 - 5.2 mmol/L -  Chloride 96 - 106 mmol/L -  CO2 20 - 29 mmol/L -  Calcium 8.7 - 10.2 mg/dL -  Total Protein 6.0 - 8.5 g/dL -  Total Bilirubin 0.0 - 1.2 mg/dL -  Alkaline Phos 39 - 539 IU/L -  AST 0 - 40 IU/L -  ALT 0 - 32 IU/L -    Discharge instruction: per After Visit Summary and "Baby and Me Booklet".  After visit meds:  Allergies as of 07/31/2018   No Known Allergies     Medication List    STOP taking these medications   aspirin EC 81 MG tablet   benzonatate 100 MG capsule Commonly known as:  TESSALON   magnesium (amino acid chelate) 133 MG tablet   PRENATAL/FOLIC ACID Tabs  TAKE these medications   ibuprofen 600 MG tablet Commonly known as:  ADVIL,MOTRIN Take 1 tablet (600 mg total) by mouth every 6 (six) hours.   methimazole 5 MG tablet Commonly known as:  TAPAZOLE Take 1 tablet (5 mg total) by mouth 2 (two) times daily. What changed:  when to take this   Oxycodone HCl 10 MG Tabs Take 1 tablet (10 mg total) by mouth every 4 (four) hours as needed (pain scale > 7).       Diet: routine diet  Activity: Advance as tolerated. Pelvic rest for 6 weeks.   Outpatient follow up: Follow up Appt: Future Appointments  Date Time Provider Department Center  08/10/2018  3:00 PM [facility] NURSE [facility]  09/03/2018  3:55 PM Rasch, Harolyn Rutherford, NP [facility]   Follow up Visit:No follow-ups on file.  Postpartum contraception: Tubal Ligation  Newborn Data: Live born female  Birth Weight: 8 lb 6 oz (3800 g) APGAR: 9, 9  Newborn Delivery   Birth date/time:  07/28/2018 12:40:00 Delivery type:  C-Section, Low Transverse Trial of labor:  No C-section categorization:  Repeat     Baby Feeding: Bottle and Breast Disposition:home with mother   07/31/2018 Jacklyn Shell, CNM

## 2018-07-31 NOTE — Lactation Note (Signed)
This note was copied from a baby's chart. Lactation Consultation Note  Patient Name: Maria Daniel [name] Date: 07/31/2018   No need to see per RN. Mom noted to take methimazole 5 mg qd (L2) for hyperthyroid.   Lurline Hare [facility] 07/31/2018, 9:58 AM

## 2018-07-31 NOTE — Lactation Note (Signed)
This note was copied from a baby's chart. Lactation Consultation Note  Patient Name: Maria Daniel [id number]'W Date: 07/31/2018 Reason for consult: Follow-up assessment;Term;Infant weight loss;Hyperbilirubinemia  47 hours old FT female who is being mostly BF by her mother at this point, mom hasn't been given baby any formula the last 24 hours, only breastmilk. Baby is at 6% weight loss and in double photo therapy, parents didn't have a good understanding about what hyperbilirubinemia was; they're both Spanish speakers.  Had a long conversation with both of them and answered all their questions. Explained to them how much a DEBP would help to give baby a bit extra of breastmilk and quantify how much he's getting; but mom politely declined stating that baby is feeding well, and that he's been cluster feeding yesterday, she's been feeding baby every hour and that she's got lots of milk, her latch scores are 10. Asked mom to let her RN know if she'd like to start pumping so we can set up a DEBP for her.  Encouraged parents to keep feeding baby at least 8-12 times/24 hours or sooner if feeding cues are present; they're both aware of LC services and will call PRN.  Maternal Data    Feeding Feeding Type: Breast Fed Length of feed: 25 min    Interventions Interventions: Breast feeding basics reviewed  Lactation Tools Discussed/Used     Consult Status Consult Status: Follow-up Date: 08/01/18 Follow-up type: In-patient    Keyan Folson Venetia Constable 07/31/2018, 11:05 PM

## 2018-07-31 NOTE — Progress Notes (Signed)
Pt has on negative pressure dressing. Called resident to find out when it would be taken off. Was told that it was to stay on until her nurse follow up on Sept 16 and that it would be removed that day. Instructed patient on that. Then she questioned whether she was really to stay on the Tapazole or have thyroid levels drawn because that was what she was told at her last prenatal visit. Called resident again and after consulting fellow, stated that patient was to stay on the Tapazole for now and that they may draw thyroid levels at the Sept appointment. Prescription called to [facility] since patient was out. Instructed patient on all this per interpretter line. Used him to do all discharge instructions.

## 2018-08-05 ENCOUNTER — Encounter: Payer: Self-pay | Admitting: Obstetrics and Gynecology

## 2018-08-05 ENCOUNTER — Other Ambulatory Visit: Payer: Self-pay

## 2018-08-06 ENCOUNTER — Ambulatory Visit (INDEPENDENT_AMBULATORY_CARE_PROVIDER_SITE_OTHER): Payer: Medicaid Other | Admitting: *Deleted

## 2018-08-06 VITALS — BP 140/85 | HR 91 | Ht 63.0 in | Wt 229.0 lb

## 2018-08-06 DIAGNOSIS — Z4889 Encounter for other specified surgical aftercare: Secondary | ICD-10-CM

## 2018-08-06 MED ORDER — OXYCODONE-ACETAMINOPHEN 5-325 MG PO TABS: 1.0000 | ORAL_TABLET | Freq: Four times a day (QID) | ORAL | 0 refills | Status: DC | PRN

## 2018-08-06 MED ORDER — OXYCODONE-ACETAMINOPHEN 5-325 MG PO TABS: 1.0000 | ORAL_TABLET | Freq: Four times a day (QID) | ORAL | 0 refills | Status: AC | PRN

## 2018-08-07 NOTE — Progress Notes (Signed)
Late entry for visit on 9/12 @ 1600. Interpreter Nile RiggsMariel Gallego present for encounter. Pt presented for wound check due to she stated that the wound vac has been beeping and vibrating since the previous evening. Dr. Debroah LoopArnold in room and removed wound vac. Incision found to be well-healed and with no bleeding, swelling or drainage noted. Cleaning technique reviewed with pt and she voiced understanding. Pt also stated that there was a problem with obtaining the Rx for Oxycodone 10 mg tablets. She only had enough Oxycodone 5-325 for 2 days and has been taking ibuprofen 800 mg without adequate pain relief. New Rx for Oxycodone 5-325 provided by Dr. Debroah LoopArnold. Pt will return for PP visit on 10/10 as scheduled.

## 2018-08-10 ENCOUNTER — Ambulatory Visit: Payer: Self-pay

## 2018-09-03 ENCOUNTER — Encounter: Payer: Self-pay | Admitting: Obstetrics and Gynecology

## 2018-09-03 ENCOUNTER — Ambulatory Visit: Payer: Self-pay | Admitting: Obstetrics and Gynecology

## 2018-11-03 ENCOUNTER — Other Ambulatory Visit: Payer: Self-pay

## 2018-12-23 ENCOUNTER — Other Ambulatory Visit: Payer: Self-pay

## 2018-12-23 ENCOUNTER — Other Ambulatory Visit (HOSPITAL_COMMUNITY): Payer: Self-pay | Admitting: Advanced Practice Midwife

## 2018-12-23 ENCOUNTER — Other Ambulatory Visit: Payer: Self-pay | Admitting: *Deleted

## 2018-12-23 MED ORDER — IBUPROFEN 800 MG PO TABS: 800.0000 mg | ORAL_TABLET | Freq: Three times a day (TID) | ORAL | 0 refills | Status: DC | PRN

## 2018-12-23 NOTE — Progress Notes (Signed)
Pt sent MyChart message requesting Rx for Ibuprofen 800 mg which she has taken previously for menstrual cramping with good results. Rx sent per standing order. Pt notified via Mychart message.

## 2019-05-20 ENCOUNTER — Other Ambulatory Visit: Payer: Self-pay

## 2019-05-21 ENCOUNTER — Other Ambulatory Visit: Payer: Self-pay | Admitting: Obstetrics and Gynecology

## 2019-11-01 IMAGING — US US MFM OB FOLLOW-UP
1 series · 14 of 28 positions shown · non-contrast
Comparison: none

[Series 1: us mfm ob follow-up · 54 acquisitions, 14 frames shown]
[im 2/54]
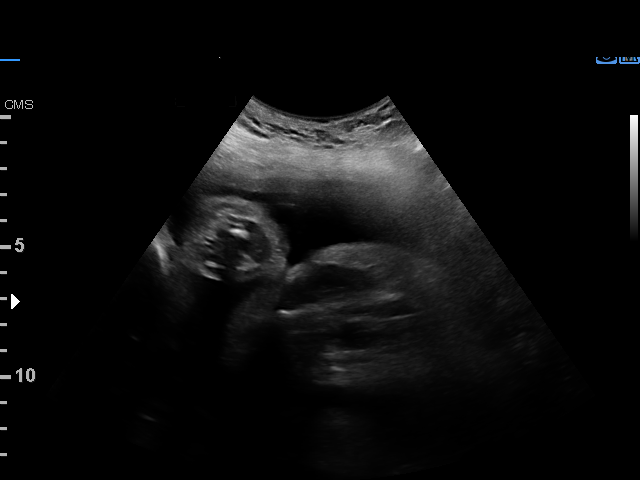
[im 6/54]
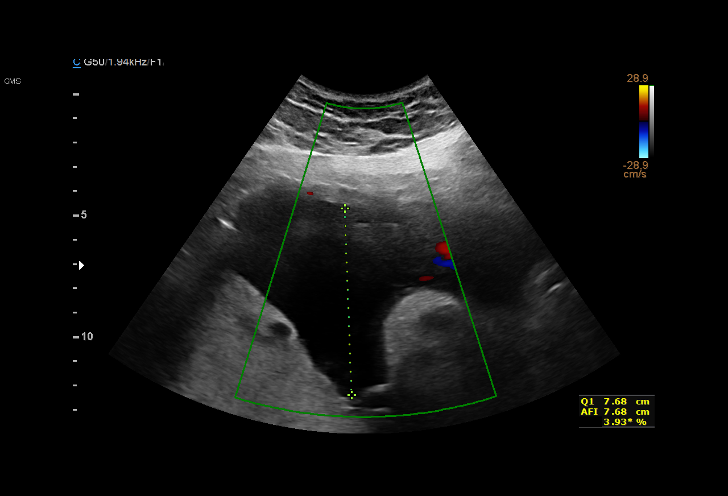
[im 10/54]
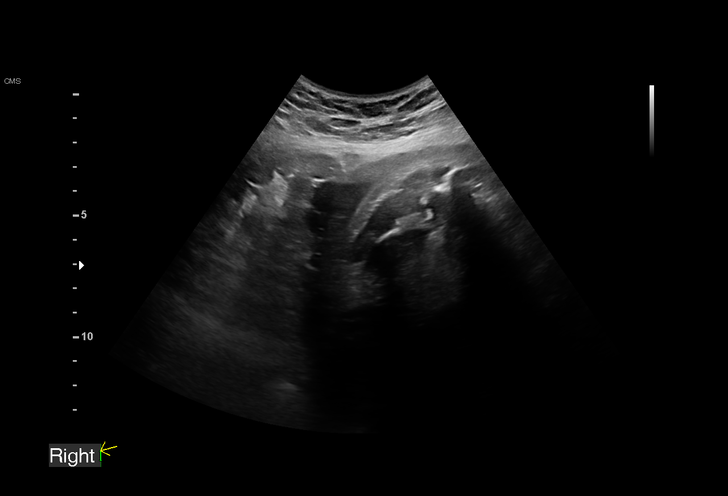
[im 14/54]
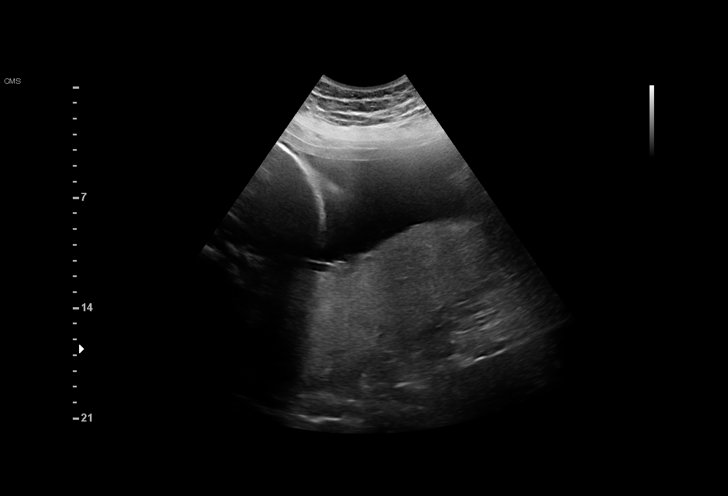
[im 18/54]
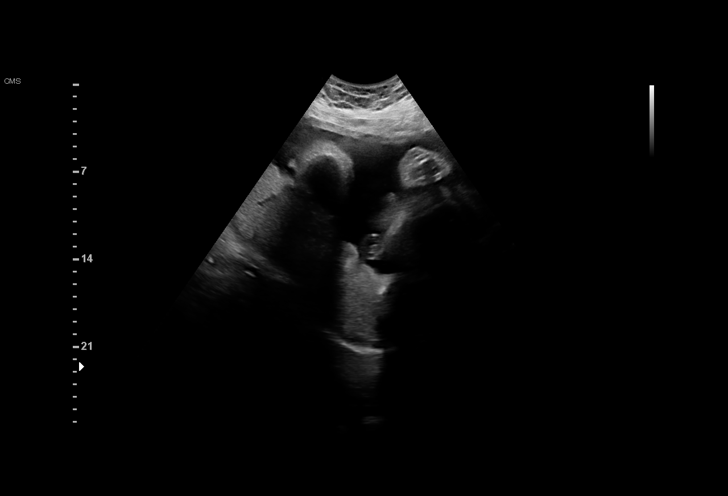
[im 22/54]
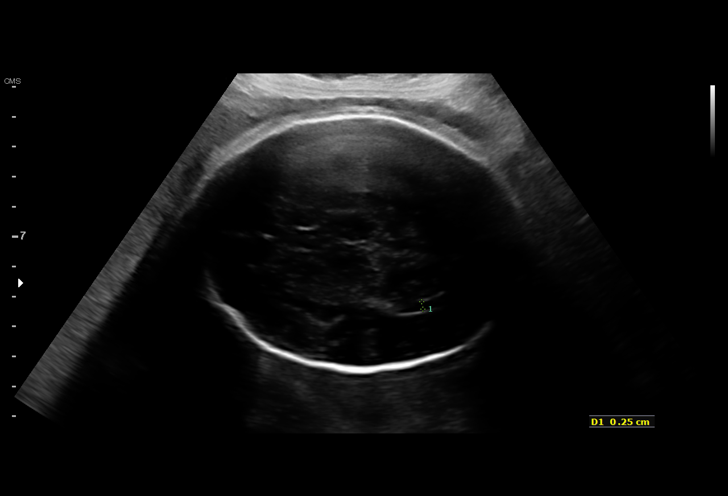
[im 26/54]
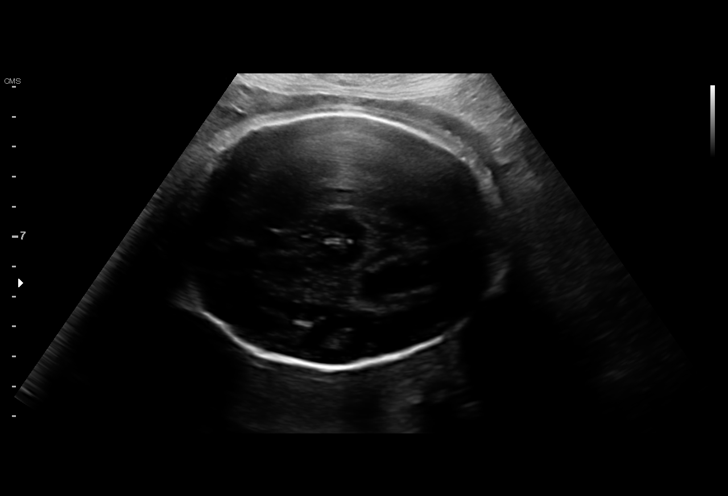
[im 30/54]
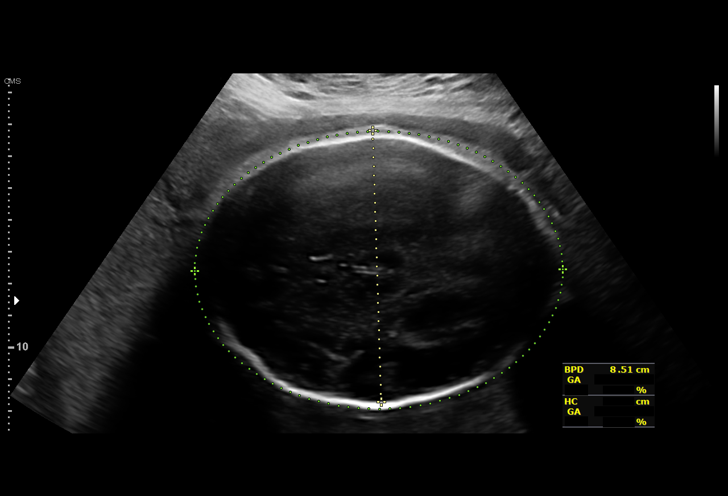
[im 34/54]
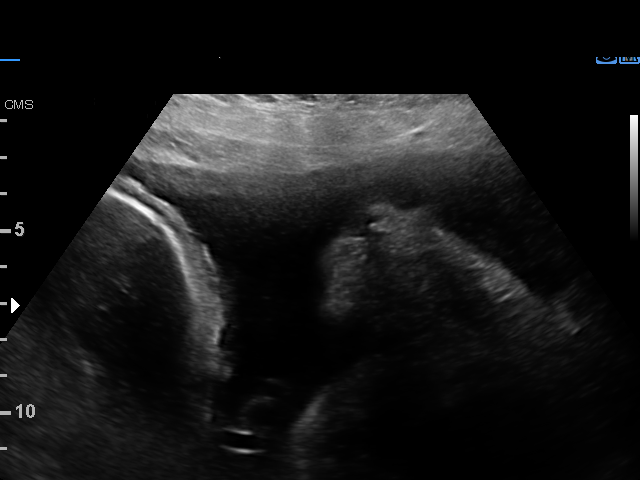
[im 38/54]
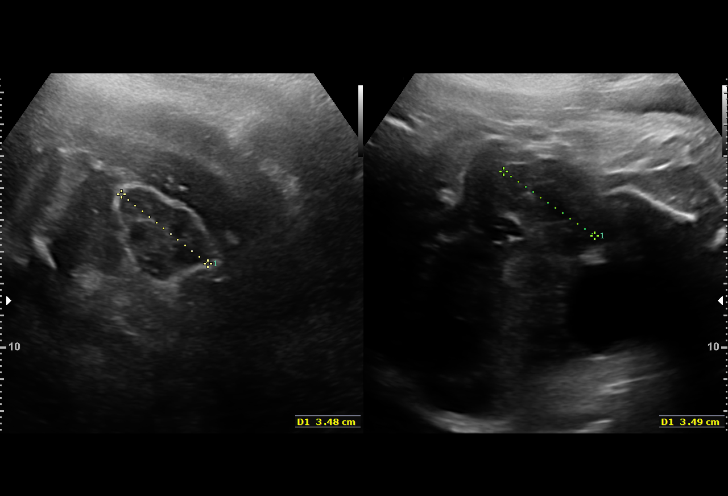
[im 42/54]
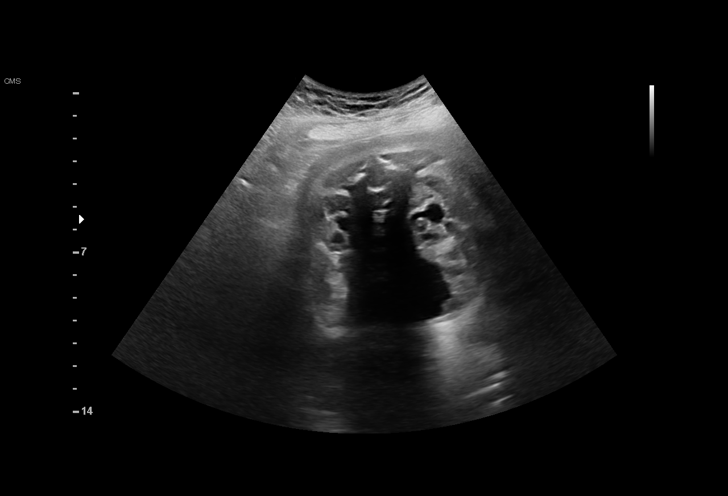
[im 46/54]
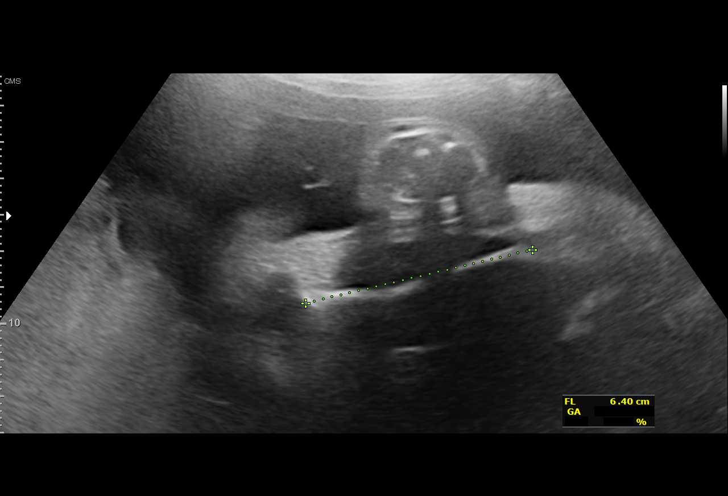
[im 50/54]
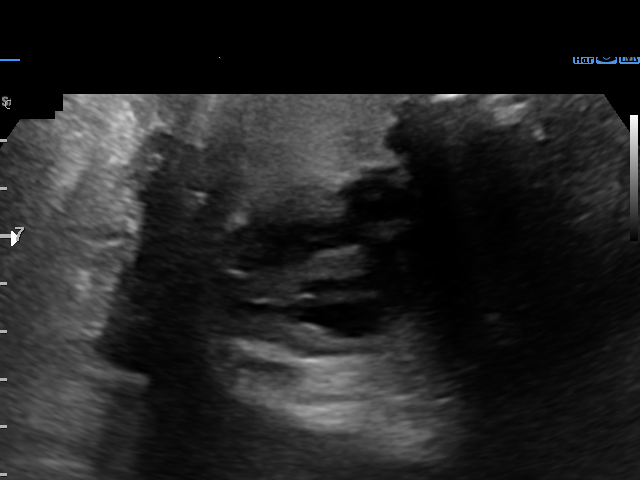
[im 54/54]
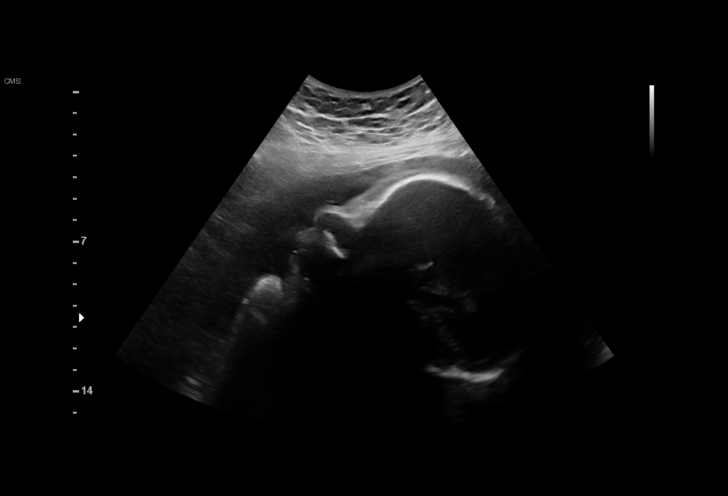

[14 of 28 positions shown; findings below may reference images not displayed]

[facility]

OB/Gyn Clinic

Indications

34 weeks gestation of pregnancy
Hypertension - Chronic/Pre-existing (ASA)
and labetalol
Previous cesarean delivery, antepartum x 3
Poor obstetric history: Previous gestational
diabetes
Obesity complicating pregnancy, third
trimester
Hyperthyroid on methimazole
OB History

Gravidity:    6         Term:   3        Prem:   0        SAB:   2
TOP:          0       Ectopic:  0        Living: 3
Fetal Evaluation

Num Of Fetuses:     1
Fetal Heart         158
Rate(bpm):
Cardiac Activity:   Observed
Presentation:       Breech
Placenta:           Posterior
P. Cord Insertion:  Visualized
Amniotic Fluid
AFI FV:      Subjectively within normal limits

AFI Sum(cm)     %Tile       Largest Pocket(cm)
19.69           73

RUQ(cm)       RLQ(cm)       LUQ(cm)        LLQ(cm)
7.68
Biophysical Evaluation

Amniotic F.V:   Within normal limits       F. Tone:        Observed
F. Movement:    Observed                   Score:          [DATE]
F. Breathing:   Observed
Biometry

BPD:      84.9  mm     G. Age:  34w 1d         54  %    CI:        72.16   %    70 - 86
FL/HC:      20.2   %    19.4 -
HC:       318   mm     G. Age:  35w 6d         59  %    HC/AC:      0.99        0.96 -
AC:       322   mm     G. Age:  36w 1d         95  %    FL/BPD:     75.7   %    71 - 87
FL:       64.3  mm     G. Age:  33w 2d         21  %    FL/AC:      20.0   %    20 - 24
HUM:      53.3  mm     G. Age:  31w 0d        < 5  %

Est. FW:    8700  gm    5 lb 12 oz      79  %
Gestational Age

LMP:           36w 3d        Date:  10/10/17                 EDD:   07/17/18
U/S Today:     34w 6d                                        EDD:   07/28/18
Best:          34w 0d     Det. By:  U/S  (04/03/18)          EDD:   08/03/18
Anatomy

Cranium:               Appears normal         Aortic Arch:            Previously seen
Cavum:                 Appears normal         Ductal Arch:            Previously seen
Ventricles:            Appears normal         Diaphragm:              Previously seen
Choroid Plexus:        Appears normal         Stomach:                Appears normal, left
sided
Cerebellum:            Appears normal         Abdomen:                Appears normal
Posterior Fossa:       Appears normal         Abdominal Wall:         Previously seen
Nuchal Fold:           Not applicable (>20    Cord Vessels:           Previously seen
wks GA)
Face:                  Profile nl; orbits     Kidneys:                Appear normal
prev  visualized
Lips:                  Previously seen        Bladder:                Appears normal
Thoracic:              Appears normal         Spine:                  Previously seen
Heart:                 Previously seen        Upper Extremities:      Previously seen
RVOT:                  Previously seen        Lower Extremities:      Previously seen
LVOT:                  Previously seen

Other:  Female gender previously seen. Technically difficult due to maternal
habitus and fetal position.
Cervix Uterus Adnexa

Cervix
Not visualized (advanced GA >93wks)
Uterus
No abnormality visualized.

Left Ovary
Not visualized.

Right Ovary
Not visualized.

Cul De Sac:   No free fluid seen.

Adnexa:       No abnormality visualized.
Impression

Amniotic fluid is normal and good fetal activity is seen. Fetal
growth is appropriate for gestational age.
your office. We performed antenatal testing today.
BPP [DATE].

Chronic hypertension. Well-controlled without
antihypertensives.

## 2019-11-08 IMAGING — US US FETAL BPP W/ NON-STRESS
1 series · 13 of 14 positions shown · non-contrast
Comparison: none

[Series 1: us fetal bpp w/nonstress · 14 acquisitions, 13 frames shown]
[im 1/14]
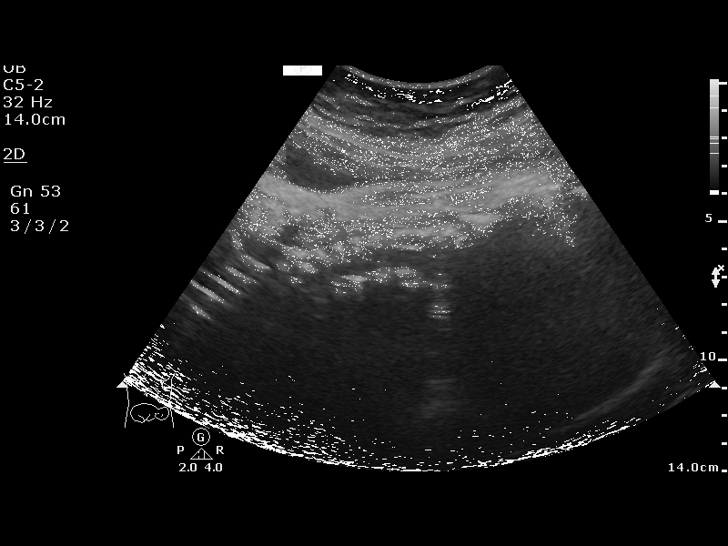
[im 2/14]
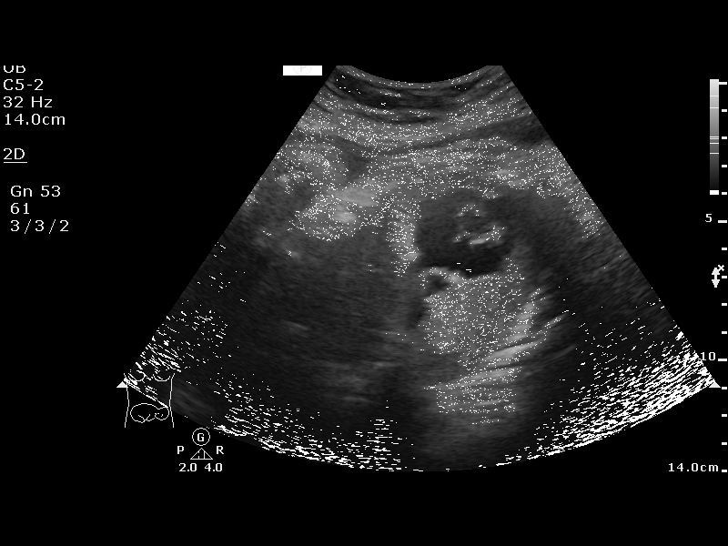
[im 3/14]
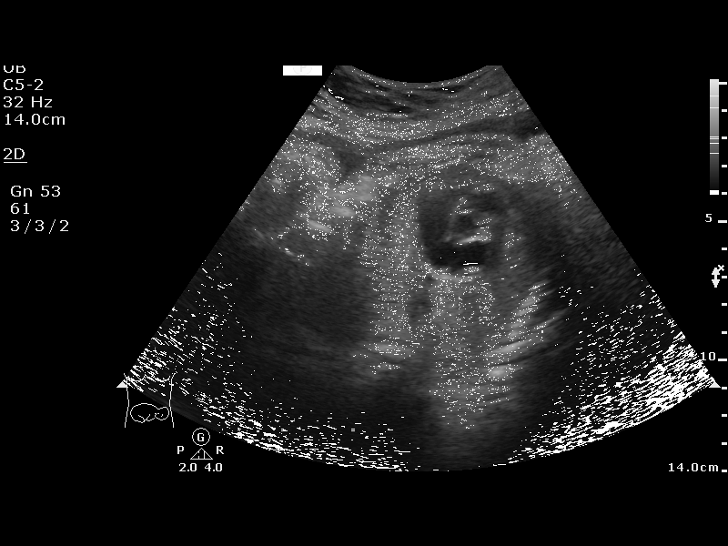
[im 4/14]
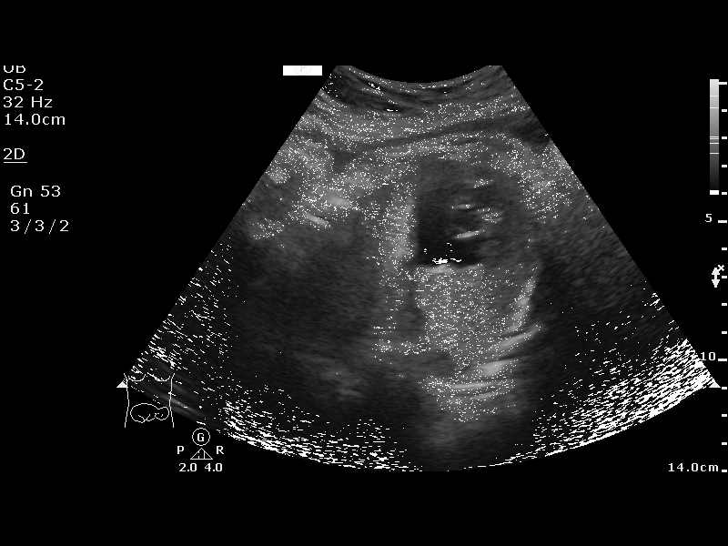
[im 5/14]
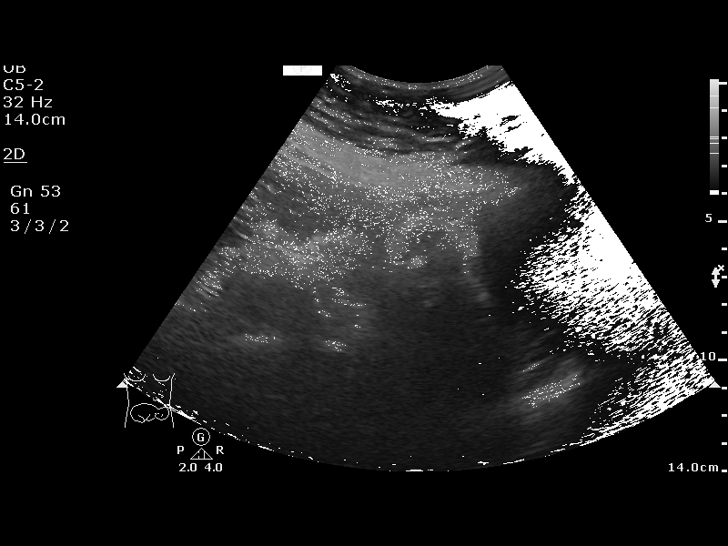
[im 6/14]
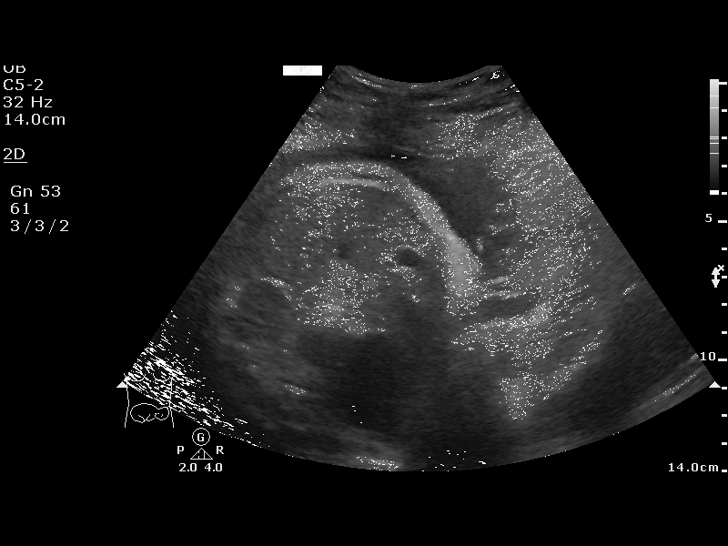
[im 8/14]
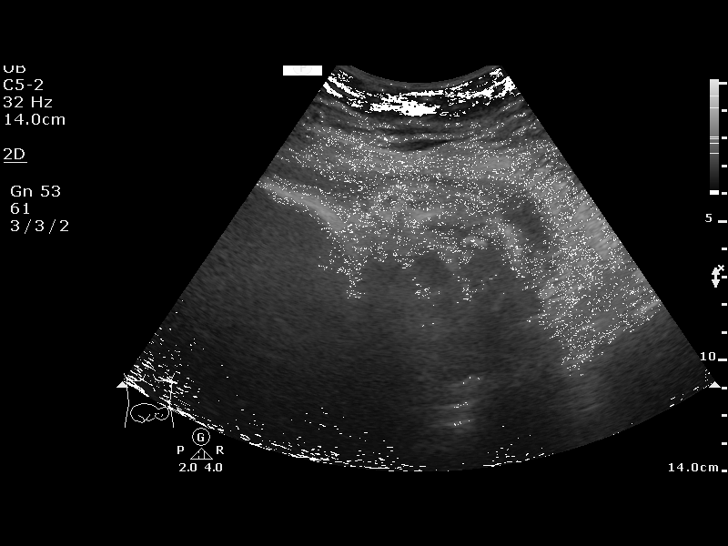
[im 9/14]
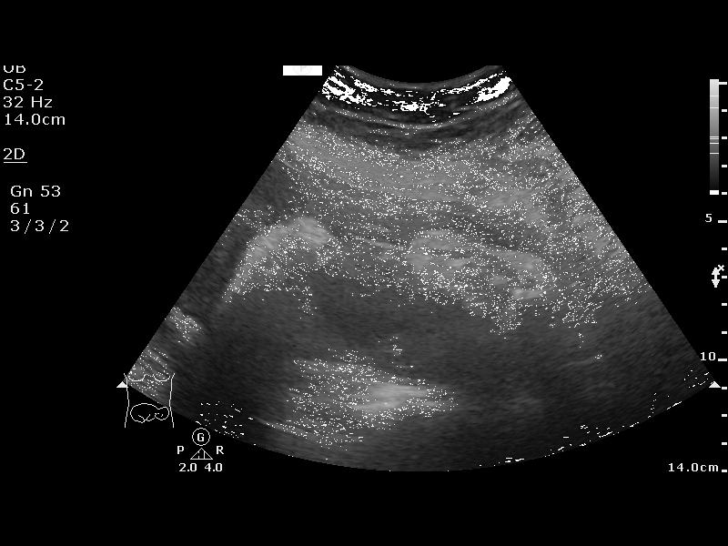
[im 10/14]
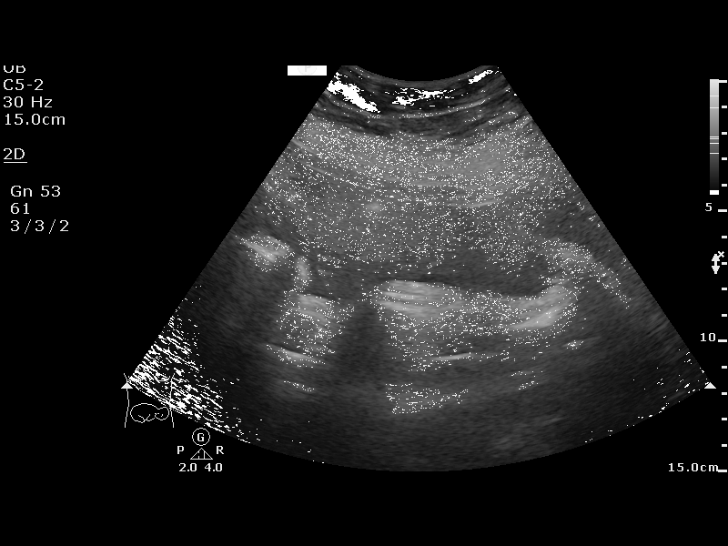
[im 11/14]
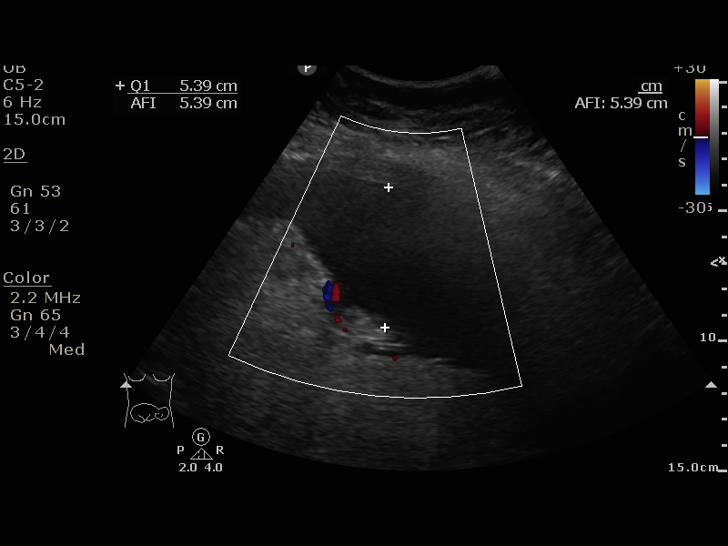
[im 12/14]
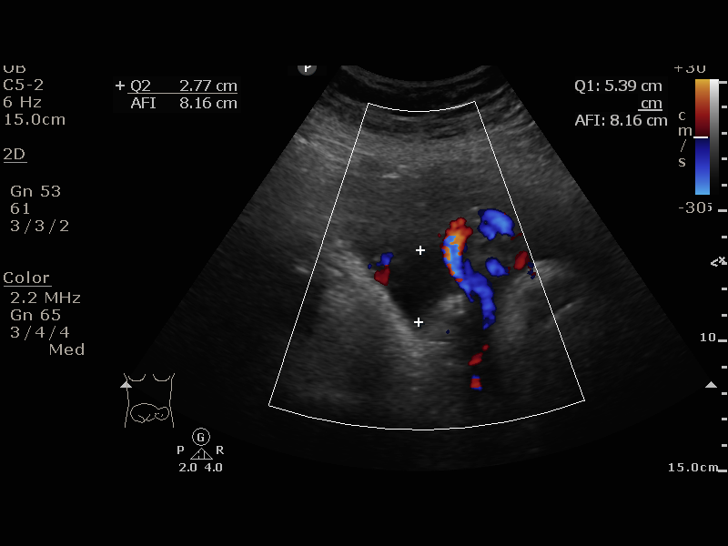
[im 13/14]
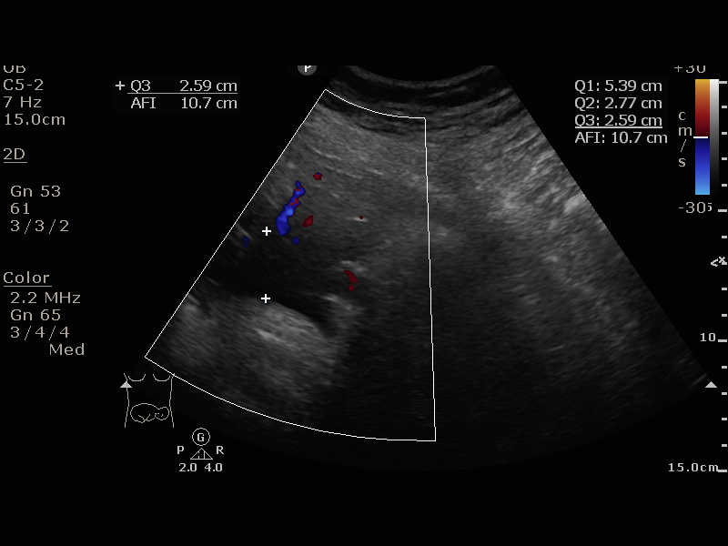
[im 14/14]
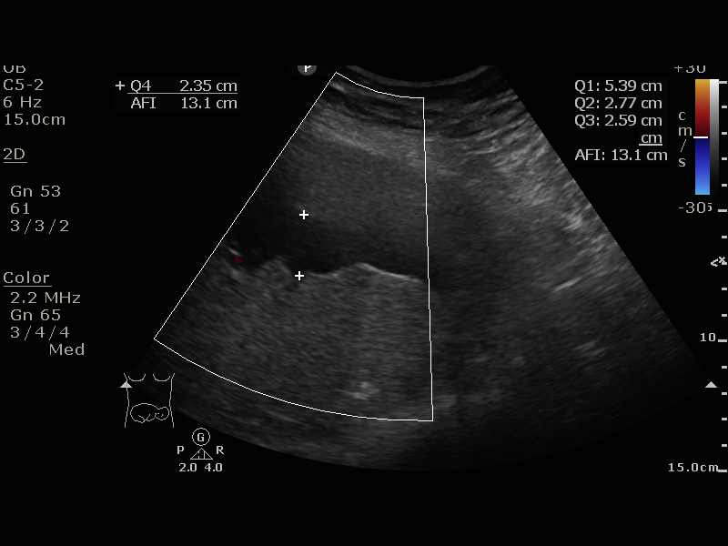

[13 of 14 positions shown; findings below may reference images not displayed]

[facility]

OB/Gyn Clinic
Women's
[REDACTED]

1  US FETAL BPP W/NONSTRESS                    76818.4

1  KAMARIA ELISE            [id number]      [id number]     [id number]
Service(s) Provided

Indications

35 weeks gestation of pregnancy
Unspecified pre-existing hypertension
complicating pregnancy, third trimester
Hyperthyroid
OB History

Gravidity:    6         Term:   3        Prem:   0        SAB:   2
TOP:          0       Ectopic:  0        Living: 3
Fetal Evaluation

Num Of Fetuses:     1
Preg. Location:     Intrauterine
Cardiac Activity:   Observed
Presentation:       Transverse, head to maternal left

Amniotic Fluid
AFI FV:      Subjectively within normal limits

AFI Sum(cm)     %Tile       Largest Pocket(cm)
13.1            43
RUQ(cm)       RLQ(cm)       LUQ(cm)        LLQ(cm)
5.39
Biophysical Evaluation

Amniotic F.V:   Pocket => 2 cm two         F. Tone:        Observed
planes
F. Movement:    Observed                   N.S.T:          Reactive
F. Breathing:   Observed                   Score:          [DATE]
Gestational Age

LMP:           37w 3d        Date:  10/10/17                 EDD:   07/17/18
Best:          35w 0d     Det. By:  U/S  (04/03/18)          EDD:   08/03/18
Comments

Technically limited exam due to maternal body habitus.
Impression

Normal amniotic fluid volume
BPP reassuring at [DATE]
Recommendations

Continue weekly antenatal testing till delivery.

## 2019-11-15 IMAGING — US US FETAL BPP W/ NON-STRESS
1 series · 13 of 14 positions shown · non-contrast
Comparison: none

[Series 1: us fetal bpp w/nonstress · 14 acquisitions, 13 frames shown]
[im 1/14]
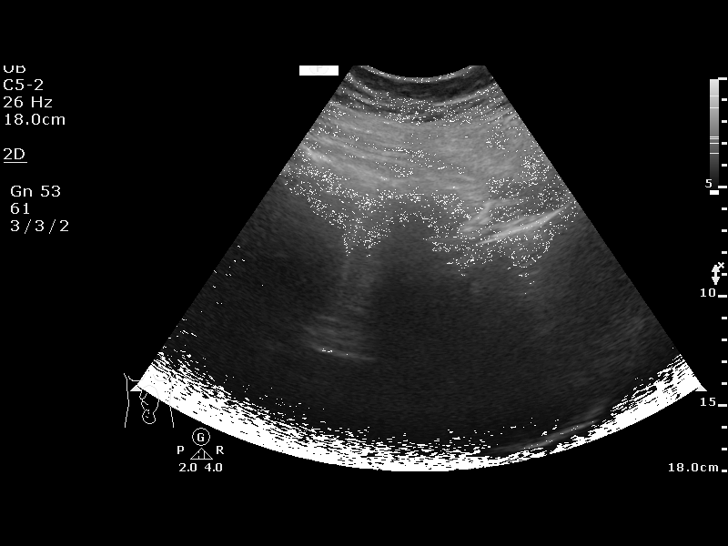
[im 2/14]
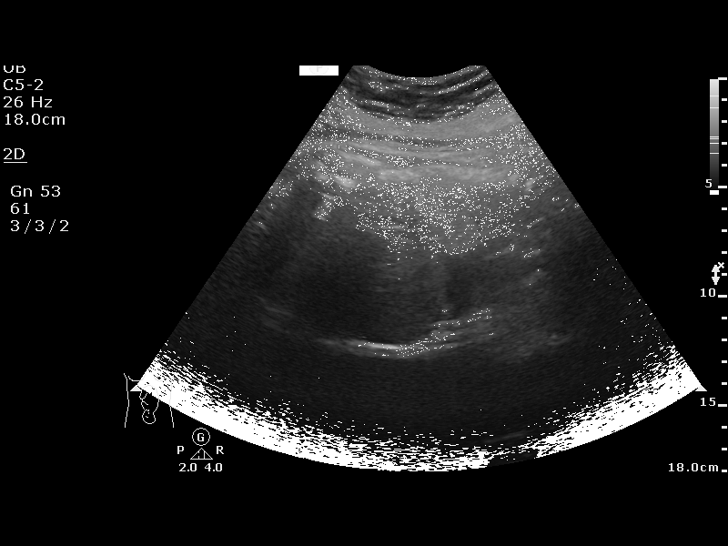
[im 3/14]
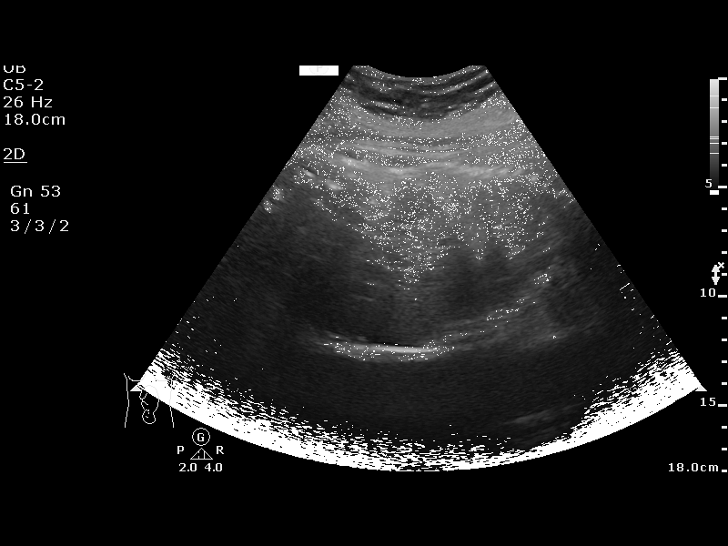
[im 4/14]
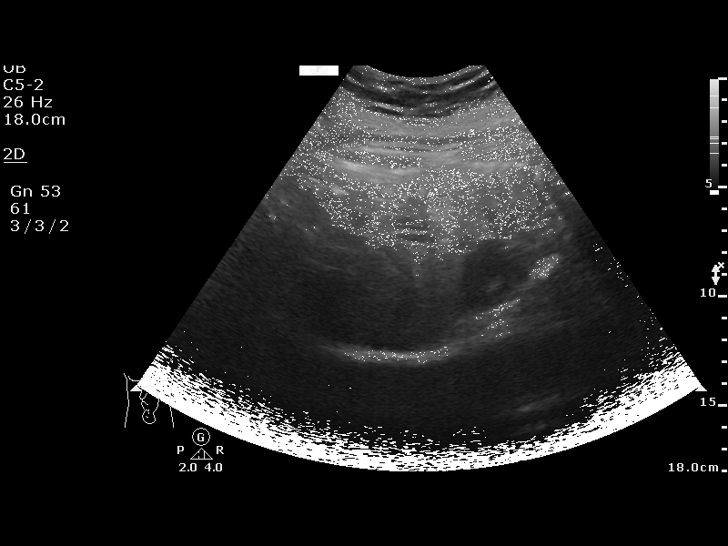
[im 5/14]
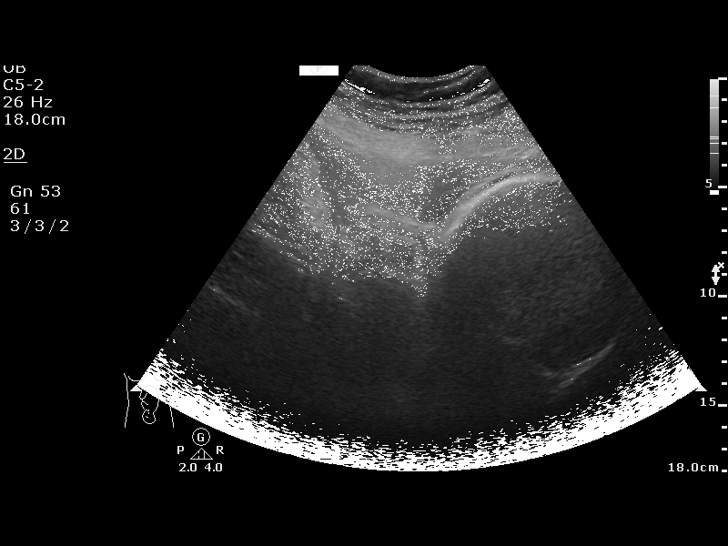
[im 6/14]
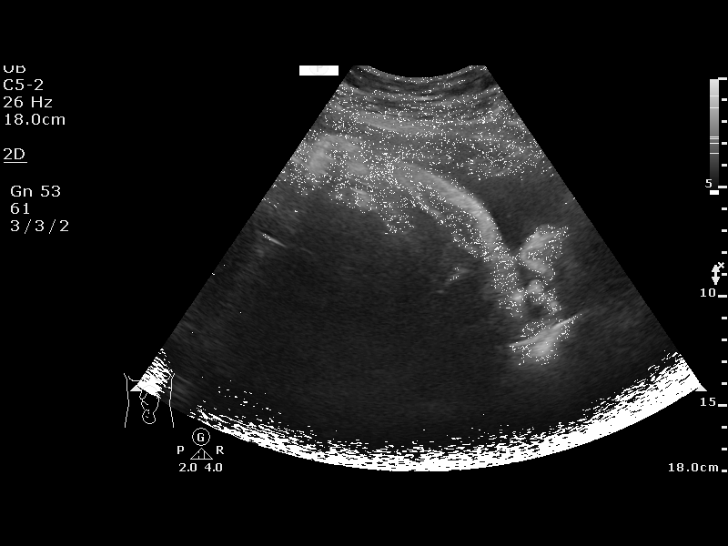
[im 8/14]
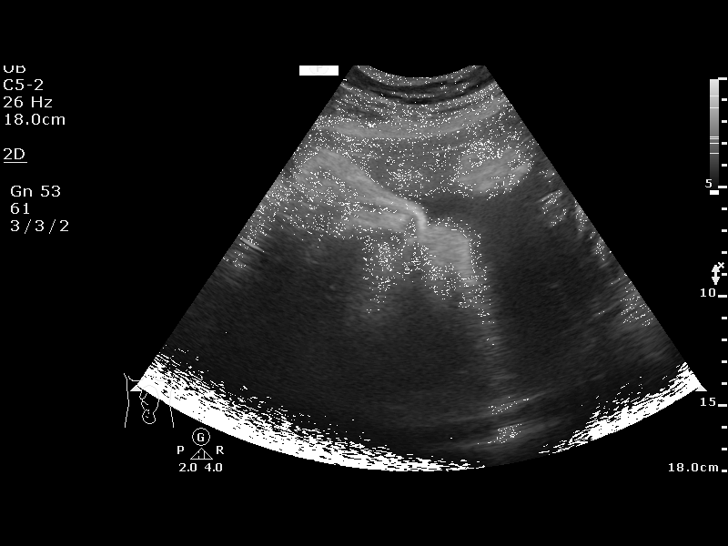
[im 9/14]
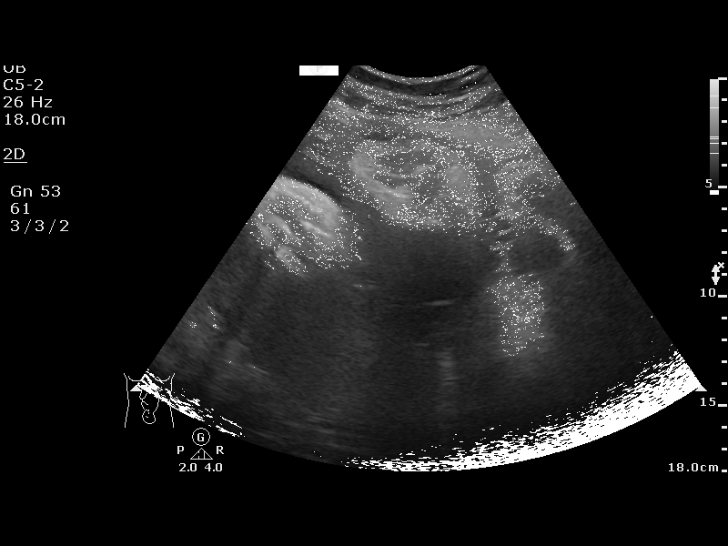
[im 10/14]
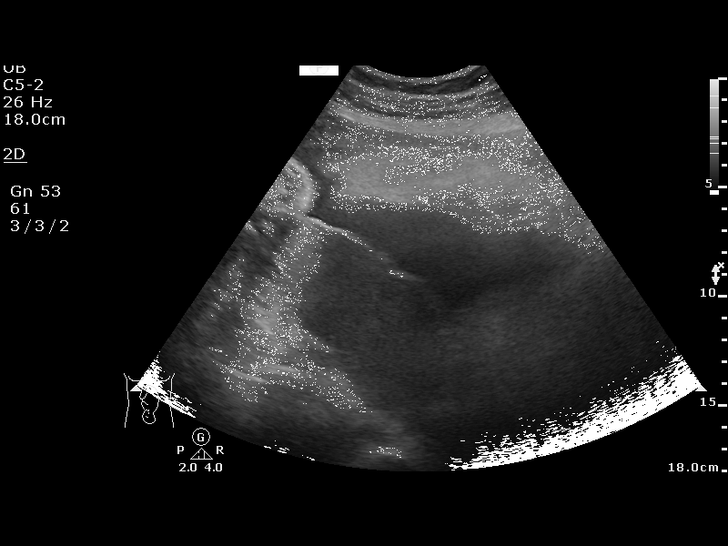
[im 11/14]
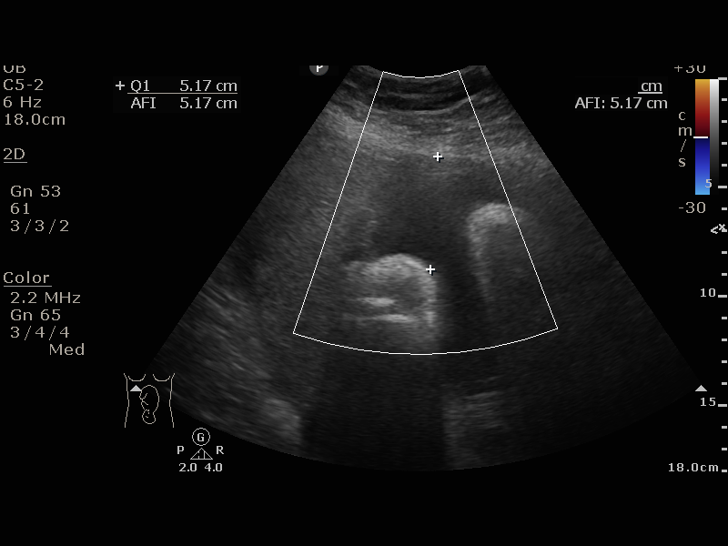
[im 12/14]
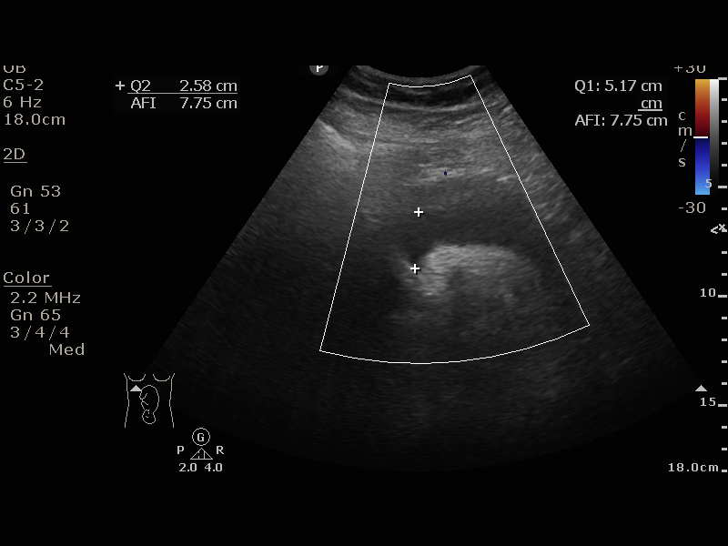
[im 13/14]
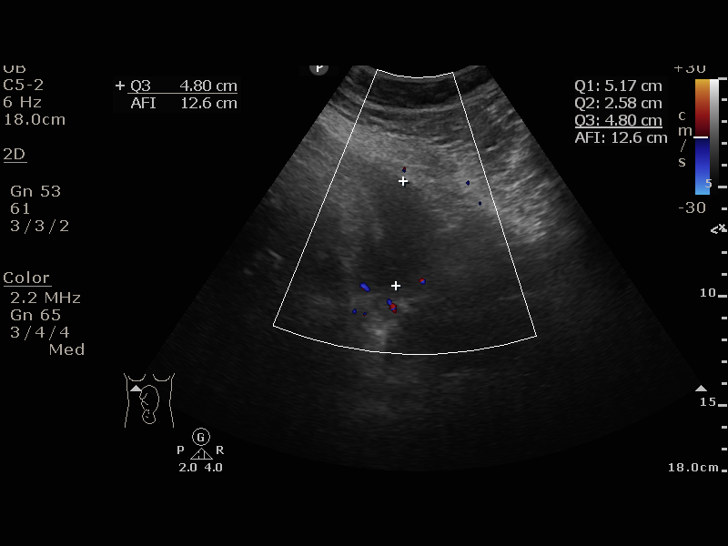
[im 14/14]
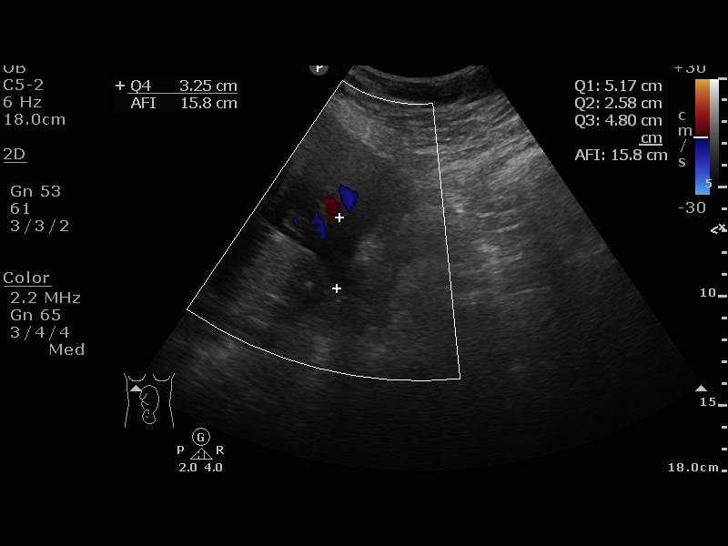

[13 of 14 positions shown; findings below may reference images not displayed]

[facility]

OB/Gyn Clinic
Attending:        Hussain Riyaz Simaty      [facility]
[REDACTED]

1  US FETAL BPP W/NONSTRESS                    76818.4

[facility]
Service(s) Provided

Indications

36 weeks gestation of pregnancy
Unspecified pre-existing hypertension
complicating pregnancy, third trimester
Hyperthyroid
OB History

Gravidity:    6         Term:   3        Prem:   0        SAB:   2
TOP:          0       Ectopic:  0        Living: 3
Fetal Evaluation

Num Of Fetuses:     1
Preg. Location:     Intrauterine
Cardiac Activity:   Observed
Presentation:       Cephalic

Amniotic Fluid
AFI FV:      Within normal limits
AFI Sum(cm)     %Tile       Largest Pocket(cm)
15.8            58

RUQ(cm)       RLQ(cm)       LUQ(cm)        LLQ(cm)
5.17
Biophysical Evaluation

Amniotic F.V:   Pocket => 2 cm two         F. Tone:        Observed
planes
F. Movement:    Observed                   N.S.T:          Reactive
F. Breathing:   Observed                   Score:          [DATE]
Gestational Age

LMP:           38w 3d        Date:  10/10/17                 EDD:   07/17/18
Best:          36w 0d     Det. By:  U/S  (04/03/18)          EDD:   08/03/18
Comments

Technically limited exam due to maternal body habitus.
Impression

Reassuring BPP of [DATE]
Normal amniotic fluid volume.
Recommendations

Continue weekly antenatal testing till delivery.

## 2019-11-22 IMAGING — US US FETAL BPP W/ NON-STRESS
1 series · 13 of 14 positions shown · non-contrast
Comparison: none

[Series 1: us fetal bpp w/nonstress · 14 acquisitions, 13 frames shown]
[im 1/14]
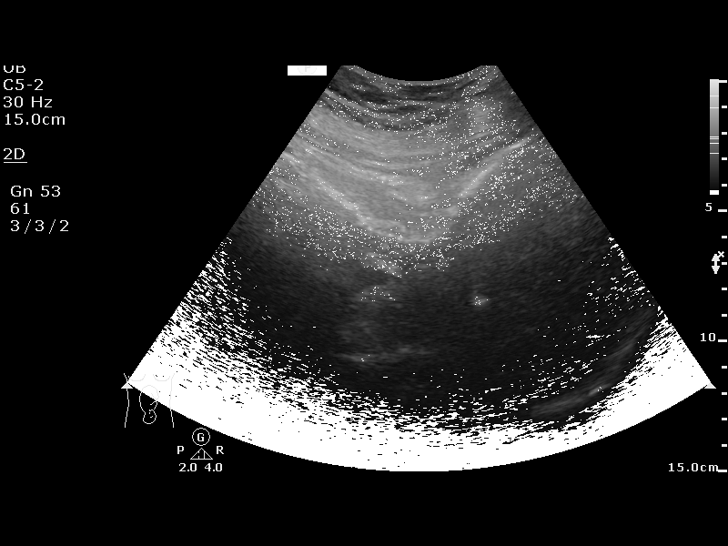
[im 2/14]
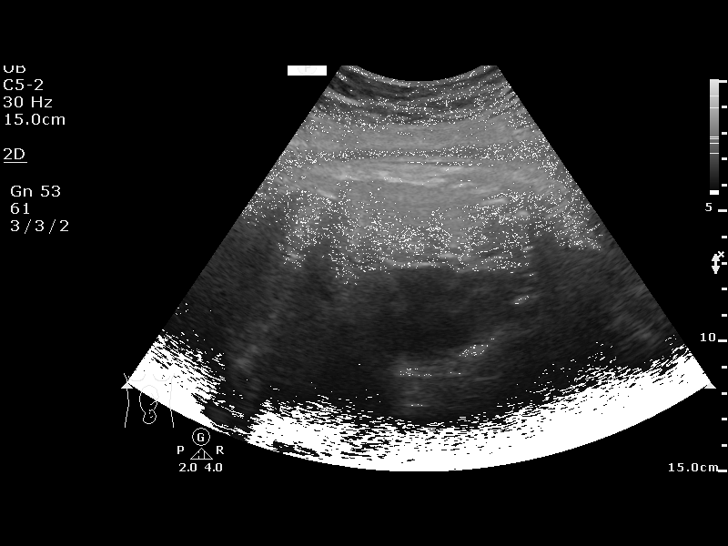
[im 3/14]
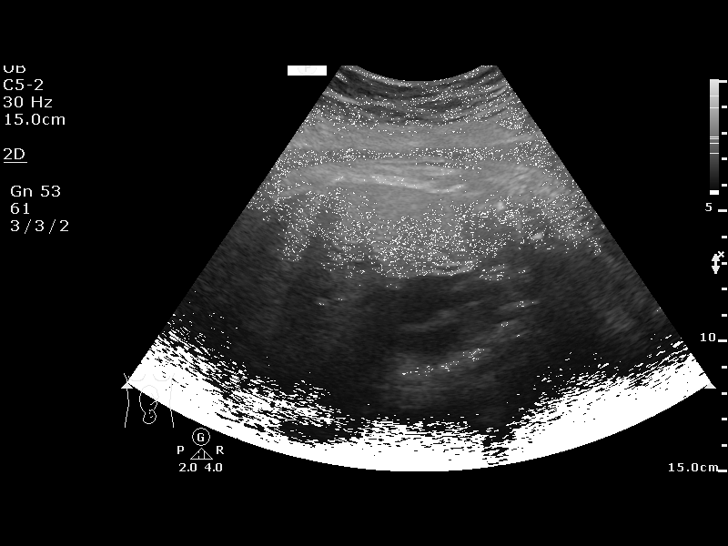
[im 4/14]
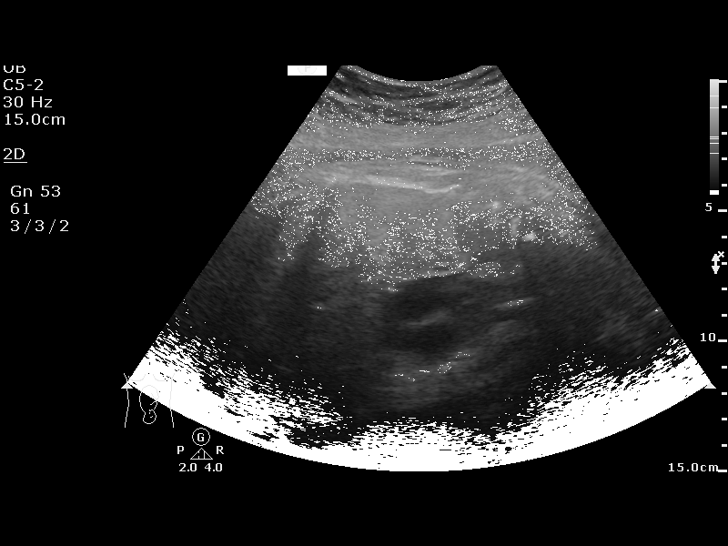
[im 5/14]
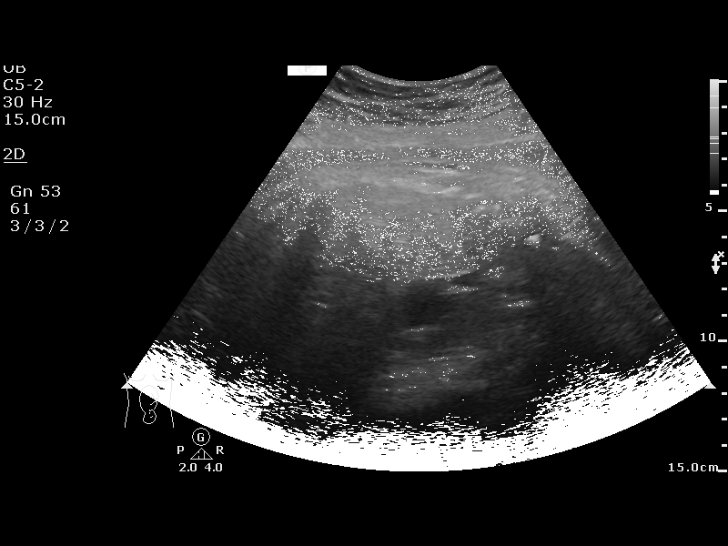
[im 6/14]
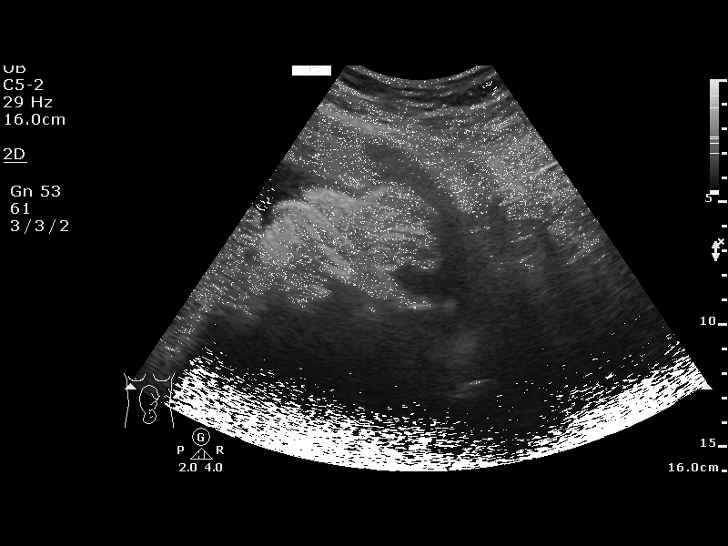
[im 8/14]
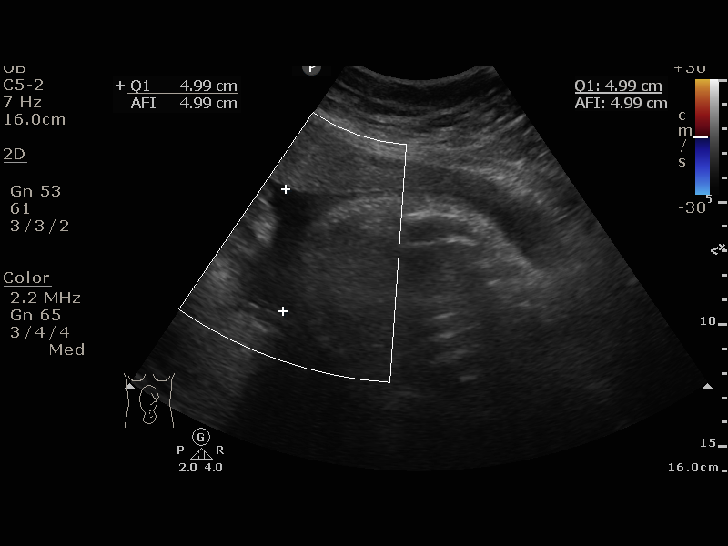
[im 9/14]
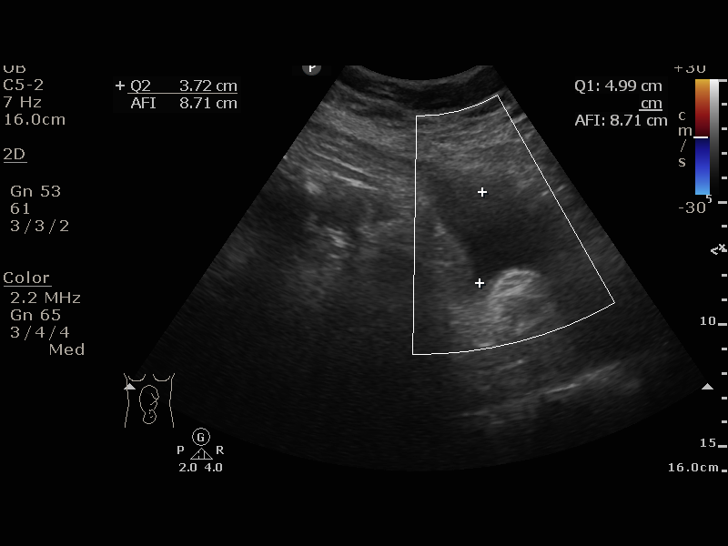
[im 10/14]
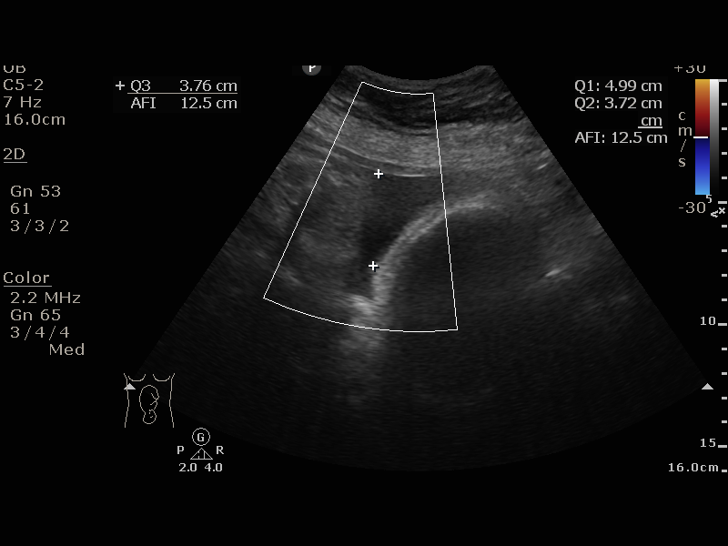
[im 11/14]
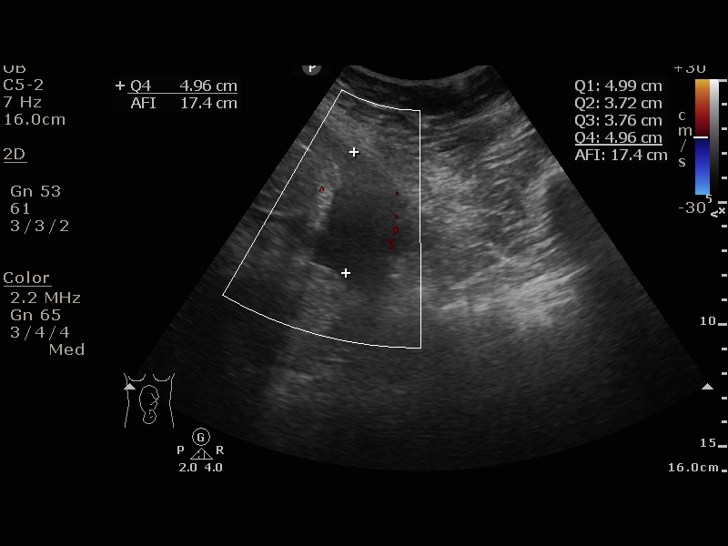
[im 12/14]
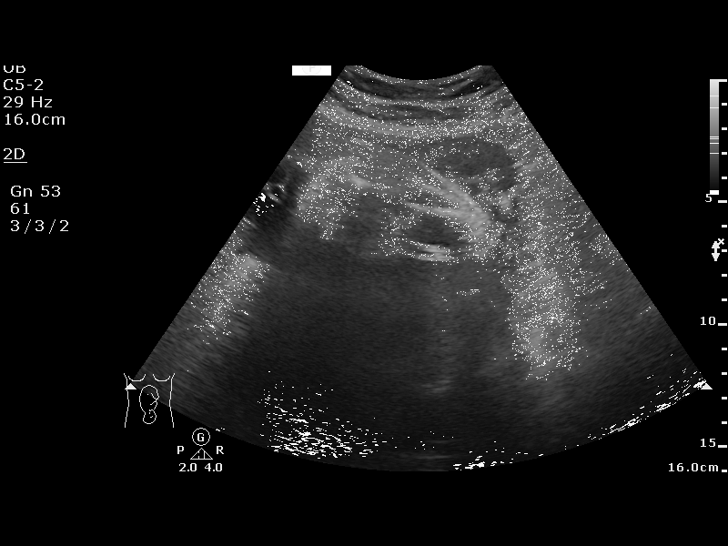
[im 13/14]
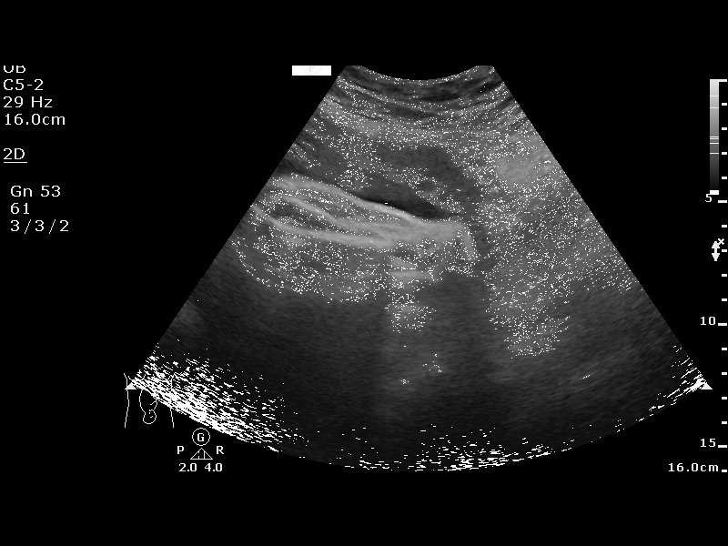
[im 14/14]
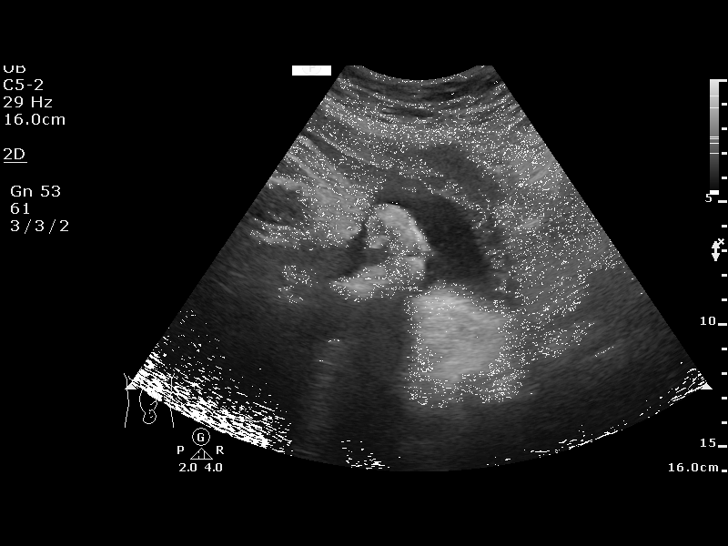

[13 of 14 positions shown; findings below may reference images not displayed]

[facility]

OB/Gyn Clinic
Women's
[REDACTED]

1  US FETAL BPP W/NONSTRESS             76818.4      NII AKWEI QUACOO

Service(s) Provided

Indications

37 weeks gestation of pregnancy
Unspecified pre-existing hypertension
complicating pregnancy, third trimester
Hyperthyroid
Fetal Evaluation

Num Of Fetuses:         1
Preg. Location:         Intrauterine
Cardiac Activity:       Observed
Presentation:           Cephalic

Amniotic Fluid
AFI FV:      Within normal limits

AFI Sum(cm)     %Tile       Largest Pocket(cm)
17.43           66

RUQ(cm)       RLQ(cm)       LUQ(cm)        LLQ(cm)
4.99
Biophysical Evaluation

Amniotic F.V:   Pocket => 2 cm two         F. Tone:        Observed
planes
F. Movement:    Observed                   N.S.T:          Reactive
F. Breathing:   Observed                   Score:          [DATE]
OB History

Gravidity:    6         Term:   3        Prem:   0        SAB:   2
TOP:          0       Ectopic:  0        Living: 3
Gestational Age

LMP:           39w 3d        Date:  10/10/17                 EDD:   07/17/18
Best:          37w 0d     Det. By:  U/S  (04/03/18)          EDD:   08/03/18
Comments

Technically limited exam due to maternal body habitus and
advanced gestational age.
Impression

[facility]
Recommendations

Continue with antenatal testing as indicated

## 2020-08-12 ENCOUNTER — Inpatient Hospital Stay (HOSPITAL_COMMUNITY)
Admission: EM | Admit: 2020-08-12 | Discharge: 2020-08-13 | Disposition: A | Discharge status: Home / Self Care | Payer: Self-pay | Attending: Obstetrics & Gynecology | Admitting: Obstetrics & Gynecology

## 2020-08-12 ENCOUNTER — Encounter (HOSPITAL_COMMUNITY): Payer: Self-pay | Admitting: Emergency Medicine

## 2020-08-12 ENCOUNTER — Other Ambulatory Visit: Payer: Self-pay

## 2020-08-12 DIAGNOSIS — O208 Other hemorrhage in early pregnancy: Secondary | ICD-10-CM | POA: Insufficient documentation

## 2020-08-12 DIAGNOSIS — O468X1 Other antepartum hemorrhage, first trimester: Secondary | ICD-10-CM

## 2020-08-12 DIAGNOSIS — Z3A01 Less than 8 weeks gestation of pregnancy: Secondary | ICD-10-CM | POA: Insufficient documentation

## 2020-08-12 DIAGNOSIS — O26899 Other specified pregnancy related conditions, unspecified trimester: Secondary | ICD-10-CM

## 2020-08-12 DIAGNOSIS — R1032 Left lower quadrant pain: Secondary | ICD-10-CM | POA: Insufficient documentation

## 2020-08-12 DIAGNOSIS — N83202 Unspecified ovarian cyst, left side: Secondary | ICD-10-CM

## 2020-08-12 DIAGNOSIS — E119 Type 2 diabetes mellitus without complications: Secondary | ICD-10-CM | POA: Insufficient documentation

## 2020-08-12 LAB — COMPREHENSIVE METABOLIC PANEL
ALT: 16 U/L (ref 0–44)
AST: 21 U/L (ref 15–41)
Albumin: 3.9 g/dL (ref 3.5–5.0)
Alkaline Phosphatase: 77 U/L (ref 38–126)
Anion gap: 10 (ref 5–15)
BUN: 12 mg/dL (ref 6–20)
CO2: 22 mmol/L (ref 22–32)
Calcium: 10 mg/dL (ref 8.9–10.3)
Chloride: 104 mmol/L (ref 98–111)
Creatinine, Ser: 0.68 mg/dL (ref 0.44–1.00)
GFR calc Af Amer: >60 mL/min (ref >60)
GFR calc non Af Amer: >60 mL/min (ref >60)
Glucose, Bld: 157 mg/dL — ABNORMAL HIGH (ref 70–99)
Potassium: 3.7 mmol/L (ref 3.5–5.1)
Sodium: 136 mmol/L (ref 135–145)
Total Bilirubin: 0.5 mg/dL (ref 0.3–1.2)
Total Protein: 8 g/dL (ref 6.5–8.1)

## 2020-08-12 LAB — CBC WITH DIFFERENTIAL/PLATELET
Abs Immature Granulocytes: 0.05 10*3/uL (ref 0.00–0.07)
Basophils Absolute: 0.1 10*3/uL (ref 0.0–0.1)
Basophils Relative: 1 %
Eosinophils Absolute: 0.2 10*3/uL (ref 0.0–0.5)
Eosinophils Relative: 2 %
HCT: 44.7 % (ref 36.0–46.0)
Hemoglobin: 14.1 g/dL (ref 12.0–15.0)
Immature Granulocytes: 0 %
Lymphocytes Relative: 29 %
Lymphs Abs: 3.2 10*3/uL (ref 0.7–4.0)
MCH: 27 pg (ref 26.0–34.0)
MCHC: 31.5 g/dL (ref 30.0–36.0)
MCV: 85.5 fL (ref 80.0–100.0)
Monocytes Absolute: 0.7 10*3/uL (ref 0.1–1.0)
Monocytes Relative: 6 %
Neutro Abs: 7.1 10*3/uL (ref 1.7–7.7)
Neutrophils Relative %: 62 %
Platelets: 406 10*3/uL — ABNORMAL HIGH (ref 150–400)
RBC: 5.23 MIL/uL — ABNORMAL HIGH (ref 3.87–5.11)
RDW: 12.9 % (ref 11.5–15.5)
WBC: 11.3 10*3/uL — ABNORMAL HIGH (ref 4.0–10.5)
nRBC: 0 % (ref 0.0–0.2)

## 2020-08-12 LAB — URINALYSIS, ROUTINE W REFLEX MICROSCOPIC
Bacteria, UA: NONE SEEN
Bilirubin Urine: NEGATIVE
Glucose, UA: NEGATIVE mg/dL
Hgb urine dipstick: NEGATIVE
Ketones, ur: NEGATIVE mg/dL
Leukocytes,Ua: NEGATIVE
Nitrite: NEGATIVE
Protein, ur: 30 mg/dL — AB
Specific Gravity, Urine: 1.025 (ref 1.005–1.030)
pH: 5 (ref 5.0–8.0)

## 2020-08-12 LAB — I-STAT BETA HCG BLOOD, ED (MC, WL, AP ONLY): I-stat hCG, quantitative: >2000 m[IU]/mL — ABNORMAL HIGH (ref <5)

## 2020-08-12 NOTE — ED Triage Notes (Signed)
Pt to ED with c/o lower abd pain.  Pt sts she had her tubes tied in 2019.  St's she has not had a period this month and has had 4 positive home preg test

## 2020-08-12 NOTE — ED Triage Notes (Signed)
Emergency Medicine Provider OB Triage Evaluation Note  Maria Daniel is a 39 y.o. female, [phone], at Unknown gestation who presents to the emergency department with complaints of symptoms she associates with possible pregnancy for the past couple weeks. Lower extremity swelling, nausea, and bilateral lower abdominal aching.  LMP Jun 27, 2020.  Spanish interpreter used during interaction with this patient.  Review of  Systems  Positive: Abdominal discomfort, nausea Negative: Vomiting, fever, vaginal bleeding/discharge, urinary symptoms  Physical Exam  BP (!) 141/81 (BP Location: Left Arm)    Pulse 80    Temp 98.1 F (36.7 C) (Oral)    Resp 18    Ht 5\' 3"  (1.6 m)    Wt 106.6 kg    LMP 06/27/2020 (Exact Date)    SpO2 100%    BMI 41.63 kg/m  General: Awake, no distress  HEENT: Atraumatic  Resp: Normal effort  Cardiac: Normal rate Abd: Nondistended, nontender  MSK: Moves all extremities without difficulty Neuro: Speech clear  Medical Decision Making  Pt evaluated for pregnancy concern and is stable for transfer to MAU. Pt is in agreement with plan for transfer.  11:00 PM discussed with MAU APP, 08/27/2020, who accepts patient in transfer.  Clinical Impression  No diagnosis found.     Victorino Dike, PA-C 08/13/20 [phone]

## 2020-08-13 ENCOUNTER — Inpatient Hospital Stay (HOSPITAL_COMMUNITY): Payer: Self-pay

## 2020-08-13 ENCOUNTER — Other Ambulatory Visit: Payer: Self-pay

## 2020-08-13 ENCOUNTER — Encounter (HOSPITAL_COMMUNITY): Payer: Self-pay | Admitting: Obstetrics & Gynecology

## 2020-08-13 LAB — WET PREP, GENITAL
Clue Cells Wet Prep HPF POC: NONE SEEN
Sperm: NONE SEEN
Trich, Wet Prep: NONE SEEN
Yeast Wet Prep HPF POC: NONE SEEN

## 2020-08-13 LAB — HCG, QUANTITATIVE, PREGNANCY: hCG, Beta Chain, Quant, S: 6262 m[IU]/mL — ABNORMAL HIGH (ref <5)

## 2020-08-13 MED ORDER — PRENATAL VITAMINS 28-0.8 MG PO TABS: 1.0000 | ORAL_TABLET | Freq: Every day | ORAL | 3 refills | Status: DC

## 2020-08-13 MED ORDER — ACETAMINOPHEN 500 MG PO TABS: 1000.0000 mg | ORAL_TABLET | Freq: Four times a day (QID) | ORAL | 0 refills | Status: AC | PRN

## 2020-08-13 NOTE — Discharge Instructions (Signed)
Ovarian Cyst An ovarian cyst is a fluid-filled sac on an ovary. The ovaries are organs that make eggs in women. Most ovarian cysts go away on their own and are not cancerous (are benign). Some cysts need treatment. Follow these instructions at home:  Take over-the-counter and prescription medicines only as told by your doctor.  Do not drive or use heavy machinery while taking prescription pain medicine.  Get pelvic exams and Pap tests as often as told by your doctor.  Return to your normal activities as told by your doctor. Ask your doctor what activities are safe for you.  Do not use any products that contain nicotine or tobacco, such as cigarettes and e-cigarettes. If you need help quitting, ask your doctor.  Keep all follow-up visits as told by your doctor. This is important. Contact a doctor if:  Your periods are: ? Late. ? Irregular. ? Painful.   Your periods stop.  You have pelvic pain that does not go away.  You have pressure on your bladder.  You have trouble making your bladder empty when you pee (urinate).  You have pain during sex.  You have any of the following in your belly (abdomen): ? A feeling of fullness. ? Pressure. ? Discomfort. ? Pain that does not go away. ? Swelling.  You feel sick most of the time.  You have trouble pooping (have constipation).  You are not as hungry as usual (you lose your appetite).  You get very bad acne.  You start to have more hair on your body and face.  You are gaining weight or losing weight without changing your exercise and eating habits.  You think you may be pregnant. Get help right away if:  You have belly pain that is very bad or gets worse.  You cannot eat or drink without throwing up (vomiting).  You suddenly get a fever.  Your period is a lot heavier than usual. This information is not intended to replace advice given to you by your health care provider. Make sure you discuss any questions you have  with your health care provider. Document Revised: 10/24/2017 Document Reviewed: 04/14/2016 Elsevier Patient Education  2020 Elsevier Inc.  

## 2020-08-13 NOTE — MAU Note (Signed)
Maria Daniel is a 39 y.o. at Unknown here in MAU reporting: abd pain, had tubal in 2019, denies VB LMP: 3-4 of Aug   Pain score: 10 Vitals:   08/12/20 2058 08/13/20 0000  BP: (!) 141/81 (!) 145/86  Pulse: 80 71  Resp: 18 18  Temp: 98.1 F (36.7 C) 98.5 F (36.9 C)  SpO2: 100% 99%

## 2020-08-13 NOTE — MAU Provider Note (Signed)
History     CSN: [id number]  Arrival date and time: 08/12/20 2024   None     Chief Complaint  Patient presents with  . Abdominal Pain   HPI   [name] is a 39 y.o. female who presented to the ED with LLQ pain that has been present for about 15 days. The pain worsens when she changes positions or moves from side to side. She took an at home pregnancy test which was positive. She had a BTL in 2019 ([place])  She reports her pain is currently 6/10. She took ibuprofen which helped some. She reports no fever or N/V. No bleeding. 4 previous C-sections.    OB History    Gravida  7   Para  3   Term  3   Preterm      AB  2   Living  3     SAB  2   TAB      Ectopic      Multiple  0   Live Births  3           Past Medical History:  Diagnosis Date  . Complication of anesthesia    had trouble with spinal not working with last cs does not want a learner to do spinal  . Diabetes mellitus without complication (HCC)   . Dyspnea   . Gestational diabetes   . Hypertension    no meds patient denies  . Hyperthyroidism   . Insomnia   . Knee pain   . Miscarriage     Past Surgical History:  Procedure Laterality Date  . CESAREAN SECTION    . CESAREAN SECTION  08/17/2012   Procedure: CESAREAN SECTION;  Surgeon: Lesly Dukes, MD;  Location: [facility];  Service: Obstetrics;  Laterality: N/A;  . CESAREAN SECTION N/A 04/23/2016   Procedure: CESAREAN SECTION;  Surgeon: Catalina Antigua, MD;  Location: [facility];  Service: Obstetrics;  Laterality: N/A;  . CESAREAN SECTION N/A 07/28/2018   Procedure: REPEAT CESAREAN SECTION;  Surgeon: Catalina Antigua, MD;  Location: [facility];  Service: Obstetrics;  Laterality: N/A;  . CESAREAN SECTION WITH BILATERAL TUBAL LIGATION Bilateral 07/28/2018   Procedure: BILATERAL TUBAL LIGATION;  Surgeon: Catalina Antigua, MD;  Location: [facility];  Service: Obstetrics;  Laterality: Bilateral;  . LESION REMOVAL  N/A 11/11/2016   Procedure: EXCISION OF SUBCUTANEOUS LESION/ ABDOMEN;  Surgeon: Hermina Staggers, MD;  Location: [facility];  Service: Gynecology;  Laterality: N/A;    Family History  Problem Relation Age of Onset  . Hypertension Mother   . Kidney disease Father   . Multiple sclerosis Father     Social History   Tobacco Use  . Smoking status: Never Smoker  . Smokeless tobacco: Never Used  Vaping Use  . Vaping Use: Never used  Substance Use Topics  . Alcohol use: No    Comment: socially  . Drug use: No    Allergies: No Known Allergies  Medications Prior to Admission  Medication Sig Dispense Refill Last Dose  . ibuprofen (ADVIL) 800 MG tablet TAKE 1 TABLET BY MOUTH EVERY 8 HOURS AS NEEDED 30 tablet 5   . ibuprofen (ADVIL,MOTRIN) 600 MG tablet Take 1 tablet (600 mg total) by mouth every 6 (six) hours. 30 tablet 0   . methimazole (TAPAZOLE) 5 MG tablet Take 1 tablet (5 mg total) by mouth 2 (two) times daily. (Patient taking differently: Take 5 mg by mouth daily. ) 60 tablet 1   .  methimazole (TAPAZOLE) 5 MG tablet Take 1 tablet (5 mg total) by mouth 2 (two) times daily. 90 tablet 0    Results for orders placed or performed during the hospital encounter of 08/12/20 (from the past 72 hour(s))  CBC with Differential     Status: Abnormal   Collection Time: 08/12/20  9:11 PM  Result Value Ref Range   WBC 11.3 (H) 4.0 - 10.5 K/uL   RBC 5.23 (H) 3.87 - 5.11 MIL/uL   Hemoglobin 14.1 12.0 - 15.0 g/dL   HCT 40.9 36 - 46 %   MCV 85.5 80.0 - 100.0 fL   MCH 27.0 26.0 - 34.0 pg   MCHC 31.5 30.0 - 36.0 g/dL   RDW 81.1 91.4 - 78.2 %   Platelets 406 (H) 150 - 400 K/uL   nRBC 0.0 0.0 - 0.2 %   Neutrophils Relative % 62 %   Neutro Abs 7.1 1.7 - 7.7 K/uL   Lymphocytes Relative 29 %   Lymphs Abs 3.2 0.7 - 4.0 K/uL   Monocytes Relative 6 %   Monocytes Absolute 0.7 0 - 1 K/uL   Eosinophils Relative 2 %   Eosinophils Absolute 0.2 0 - 0 K/uL   Basophils Relative 1 %   Basophils Absolute 0.1 0 -  0 K/uL   Immature Granulocytes 0 %   Abs Immature Granulocytes 0.05 0.00 - 0.07 K/uL    Comment: Performed at [facility], [address] [place], [place] [address]  Comprehensive metabolic panel     Status: Abnormal   Collection Time: 08/12/20  9:11 PM  Result Value Ref Range   Sodium 136 135 - 145 mmol/L   Potassium 3.7 3.5 - 5.1 mmol/L   Chloride 104 98 - 111 mmol/L   CO2 22 22 - 32 mmol/L   Glucose, Bld 157 (H) 70 - 99 mg/dL    Comment: Glucose reference range applies only to samples taken after fasting for at least 8 hours.   BUN 12 6 - 20 mg/dL   Creatinine, Ser 3.08 0.44 - 1.00 mg/dL   Calcium 65.7 8.9 - 84.6 mg/dL   Total Protein 8.0 6.5 - 8.1 g/dL   Albumin 3.9 3.5 - 5.0 g/dL   AST 21 15 - 41 U/L   ALT 16 0 - 44 U/L   Alkaline Phosphatase 77 38 - 126 U/L   Total Bilirubin 0.5 0.3 - 1.2 mg/dL   GFR calc non Af Amer >60 >60 mL/min   GFR calc Af Amer >60 >60 mL/min   Anion gap 10 5 - 15    Comment: Performed at [facility], [address], [place], [place] [address]  Urinalysis, Routine w reflex microscopic Urine, Clean Catch     Status: Abnormal   Collection Time: 08/12/20  9:11 PM  Result Value Ref Range   Color, Urine YELLOW YELLOW   APPearance CLEAR CLEAR   Specific Gravity, Urine 1.025 1.005 - 1.030   pH 5.0 5.0 - 8.0   Glucose, UA NEGATIVE NEGATIVE mg/dL   Hgb urine dipstick NEGATIVE NEGATIVE   Bilirubin Urine NEGATIVE NEGATIVE   Ketones, ur NEGATIVE NEGATIVE mg/dL   Protein, ur 30 (A) NEGATIVE mg/dL   Nitrite NEGATIVE NEGATIVE   Leukocytes,Ua NEGATIVE NEGATIVE   RBC / HPF 0-5 0 - 5 RBC/hpf   WBC, UA 0-5 0 - 5 WBC/hpf   Bacteria, UA NONE SEEN NONE SEEN   Squamous Epithelial / LPF 0-5 0 - 5   Mucus  PRESENT     Comment: Performed at [facility], [address], [place], [place] [address]  I-Stat Beta hCG blood, ED ([facility], [facility], [facility] only)     Status: Abnormal   Collection Time: 08/12/20  9:48 PM  Result Value Ref Range   I-stat hCG,  quantitative >2,000.0 (H) <5 mIU/mL   Comment 3            Comment:   GEST. AGE      CONC.  (mIU/mL)   <=1 WEEK        5 - 50     2 WEEKS       50 - 500     3 WEEKS       100 - 10,000     4 WEEKS     1,000 - 30,000        FEMALE AND NON-PREGNANT FEMALE:     LESS THAN 5 mIU/mL   hCG, quantitative, pregnancy     Status: Abnormal   Collection Time: 08/12/20 11:45 PM  Result Value Ref Range   hCG, Beta Chain, Quant, S 6,262 (H) <5 mIU/mL    Comment:          GEST. AGE      CONC.  (mIU/mL)   <=1 WEEK        5 - 50     2 WEEKS       50 - 500     3 WEEKS       100 - 10,000     4 WEEKS     1,000 - 30,000     5 WEEKS     3,500 - 115,000   6-8 WEEKS     12,000 - 270,000    12 WEEKS     15,000 - 220,000        FEMALE AND NON-PREGNANT FEMALE:     LESS THAN 5 mIU/mL Performed at [facility], [address] [place], [place] [address]    US OB LESS THAN 14 WEEKS WITH OB TRANSVAGINAL  Result Date: 08/13/2020 CLINICAL DATA:  Abdominal pain, beta HCG 6,262 EXAM: OBSTETRIC <14 WK [place] AND TRANSVAGINAL OB US TECHNIQUE: Both transabdominal and transvaginal ultrasound examinations were performed for complete evaluation of the gestation as well as the maternal uterus, adnexal regions, and pelvic cul-de-sac. Transvaginal technique was performed to assess early pregnancy. COMPARISON:  None. FINDINGS: Intrauterine gestational sac: Single Yolk sac:  Visualized. Embryo:  Not Visualized. Cardiac Activity: Not Visualized. MSD: 7.0 mm   5 w   2 d Subchorionic hemorrhage: Moderate subchorionic hemorrhage along the superior anterior margin of the gestational sac. Maternal uterus/adnexae: Right ovary measures 3.1 x 2.0 x 2.2 cm and the left ovary measures 2.0 x 1.0 x 1.6 cm. Small follicle within the right ovary. 2.9 x 2.9 x 2.6 cm simple appearing left adnexal cyst may reflect a paraovarian cyst. No free fluid. IMPRESSION: 1. Probable early intrauterine gestational sac and yolk sac, but no fetal pole or cardiac  activity yet visualized. Recommend follow-up quantitative B-HCG levels and follow-up US in 14 days to assess viability. This recommendation follows SRU consensus guidelines: Diagnostic Criteria for Nonviable Pregnancy Early in the First Trimester. Malva Limes Med 2013; 062:6948-54. 2. Moderate subchorionic hemorrhage. 3. 2.9 cm left paraovarian cyst. Electronically Signed   By: Sharlet Salina M.D.   On: 08/13/2020 01:45   Review of Systems  Constitutional: Negative for fever.  Gastrointestinal: Positive for abdominal pain. Negative  for nausea and vomiting.  Genitourinary: Negative for vaginal bleeding and vaginal discharge.   Physical Exam   Blood pressure (!) 145/86, pulse 71, temperature 98.5 F (36.9 C), temperature source Oral, resp. rate 18, height 5\' 3"  (1.6 m), weight 106.6 kg, last menstrual period 06/27/2020, SpO2 99 %, unknown if currently breastfeeding.  Physical Exam Constitutional:      General: She is not in acute distress.    Appearance: She is well-developed. She is not ill-appearing, toxic-appearing or diaphoretic.  HENT:     Head: Normocephalic.  Eyes:     Extraocular Movements: Extraocular movements intact.  Abdominal:     Tenderness: There is abdominal tenderness in the periumbilical area, suprapubic area and left lower quadrant. There is guarding. There is no rebound.     Hernia: No hernia is present.  Skin:    General: Skin is warm.  Neurological:     Mental Status: She is alert and oriented to person, place, and time.  Psychiatric:        Mood and Affect: Mood normal.    MAU Course  Procedures  None  MDM  Ultrasound ordered in MAU: discussed US in detail with patient using spanish interpretor.  Wet prep and GC collected via self swab prior to her leaving MAU.   Assessment and Plan   A:  SIUP  1. Left ovarian cyst   2. Abdominal pain affecting pregnancy   3. Subchorionic hemorrhage of placenta in first trimester, single or unspecified fetus   4. [redacted]  weeks gestation of pregnancy     P:  Discharge home in stable condition Rx: Tylenol, prenatal vitamins Return to MAU if symptoms worse Call the office Monday to schedule New OB Pelvic rest Discussed that subchorionic can increase risk of SAB  Kjersten Ormiston, Harolyn RutherfordJennifer I, NP 08/13/2020 2:49 AM

## 2020-08-14 LAB — GC/CHLAMYDIA PROBE AMP (~~LOC~~) NOT AT ARMC
Chlamydia: NEGATIVE
Comment: NEGATIVE
Comment: NORMAL
Neisseria Gonorrhea: NEGATIVE

## 2020-08-21 ENCOUNTER — Telehealth: Payer: Self-pay | Admitting: Student

## 2020-08-21 NOTE — Telephone Encounter (Signed)
Patient called with questions about [name] and cysts, reporting that she feels some numbness in her hands and feet and also some "small cramps". Reviewed [place] report and discussed with Dr. Debroah Loop, who agrees that patient is not being followed for PUL and that patient does not need to go to MAU. Advised patient that this sounds like normal changes in pregnancy, especially with an ovarian cysts, but that she should go to MAU if bleeding or if pain gets worse. Patient agreed with plan of care and will keep appt on 09/06/2020 unless anything changes.  Patient thanked me for my help.   Maria Daniel

## 2020-09-06 ENCOUNTER — Other Ambulatory Visit: Payer: Self-pay

## 2020-09-06 ENCOUNTER — Ambulatory Visit (INDEPENDENT_AMBULATORY_CARE_PROVIDER_SITE_OTHER): Payer: Self-pay | Admitting: *Deleted

## 2020-09-06 VITALS — BP 127/74 | HR 73 | Ht 63.0 in | Wt 257.4 lb

## 2020-09-06 DIAGNOSIS — O3680X0 Pregnancy with inconclusive fetal viability, not applicable or unspecified: Secondary | ICD-10-CM

## 2020-09-06 NOTE — Progress Notes (Signed)
Interpreter Eda Royal present for encounter. Pt informed that the ultrasound is considered a limited OB ultrasound and is not intended to be a complete ultrasound exam.  Patient also informed that the ultrasound is not being completed with the intent of assessing for fetal or placental anomalies or any pelvic abnormalities.  Explained that the purpose of today's ultrasound is to assess for presence of fetal embryo and heart rate.  Patient acknowledges the purpose of the exam and the limitations of the study.    Informal US performed with abdominal probe. Intrauterine gestational sac observed however yolk sac and fetal pole were not observed. Pt denies having any vaginal bleeding since last [place] on 9/19. She also denies abdominal/pelvic pain. Pt was informed she will require formal ultrasound with vaginal probe to determine viability of pregnancy. She voiced understanding.

## 2020-09-06 NOTE — Progress Notes (Signed)
New OB Intake  II explained I am completing New OB Intake today. We discussed her EDD of 04/04/21 that is based on LMP of 06/28/20.  Patient LMP changed due to patient reports she is sure it was 06/28/20 not 06/27/20. I attempted to obtain FHR unsuccessfully. She denies vaginal bleeding since MAU visit or pain .Taken to [place] for Diane Day to check FHR with [place]. FHR unable to be obtained with ultrasound. She denies pain since she had [place] in MAU. Per protocol I scheduled an [place] for viability for first available appointment which is not until 09/18/20 at 1:00/. She has her new ob scheduled also that day at 2:55 .Marland Kitchen Discussed with Dr.Anyanwu if we need to send patient to MAU. No orders; but instructed patient if she has vaginal bleeding or pain - to go to MAU for evaluation. Otherwise  We Discussed she will have [place] 09/18/20  then come to [place] afterwards for results and new ob visit if applicable. She voices understanding. Juanetta Negash,RN

## 2020-09-16 ENCOUNTER — Inpatient Hospital Stay (HOSPITAL_COMMUNITY)
Admission: AD | Admit: 2020-09-16 | Discharge: 2020-09-17 | Disposition: A | Discharge status: Home / Self Care | Payer: Self-pay | Attending: Obstetrics & Gynecology | Admitting: Obstetrics & Gynecology

## 2020-09-16 ENCOUNTER — Other Ambulatory Visit: Payer: Self-pay

## 2020-09-16 ENCOUNTER — Inpatient Hospital Stay (HOSPITAL_BASED_OUTPATIENT_CLINIC_OR_DEPARTMENT_OTHER): Payer: Self-pay

## 2020-09-16 ENCOUNTER — Encounter (HOSPITAL_COMMUNITY): Payer: Self-pay | Admitting: Obstetrics & Gynecology

## 2020-09-16 DIAGNOSIS — M545 Low back pain, unspecified: Secondary | ICD-10-CM | POA: Insufficient documentation

## 2020-09-16 DIAGNOSIS — O468X1 Other antepartum hemorrhage, first trimester: Secondary | ICD-10-CM

## 2020-09-16 DIAGNOSIS — O4690 Antepartum hemorrhage, unspecified, unspecified trimester: Secondary | ICD-10-CM

## 2020-09-16 DIAGNOSIS — O200 Threatened abortion: Secondary | ICD-10-CM | POA: Insufficient documentation

## 2020-09-16 DIAGNOSIS — Z3A11 11 weeks gestation of pregnancy: Secondary | ICD-10-CM | POA: Insufficient documentation

## 2020-09-16 DIAGNOSIS — O09521 Supervision of elderly multigravida, first trimester: Secondary | ICD-10-CM | POA: Insufficient documentation

## 2020-09-16 DIAGNOSIS — Z3A01 Less than 8 weeks gestation of pregnancy: Secondary | ICD-10-CM

## 2020-09-16 DIAGNOSIS — O26891 Other specified pregnancy related conditions, first trimester: Secondary | ICD-10-CM | POA: Insufficient documentation

## 2020-09-16 DIAGNOSIS — O208 Other hemorrhage in early pregnancy: Secondary | ICD-10-CM | POA: Insufficient documentation

## 2020-09-16 DIAGNOSIS — O24111 Pre-existing diabetes mellitus, type 2, in pregnancy, first trimester: Secondary | ICD-10-CM | POA: Insufficient documentation

## 2020-09-16 DIAGNOSIS — Z9851 Tubal ligation status: Secondary | ICD-10-CM | POA: Insufficient documentation

## 2020-09-16 DIAGNOSIS — O10911 Unspecified pre-existing hypertension complicating pregnancy, first trimester: Secondary | ICD-10-CM | POA: Insufficient documentation

## 2020-09-16 LAB — URINALYSIS, ROUTINE W REFLEX MICROSCOPIC
Bacteria, UA: NONE SEEN
Bilirubin Urine: NEGATIVE
Glucose, UA: NEGATIVE mg/dL
Ketones, ur: NEGATIVE mg/dL
Nitrite: NEGATIVE
Protein, ur: 30 mg/dL — AB
RBC / HPF: >50 RBC/hpf — ABNORMAL HIGH (ref 0–5)
Specific Gravity, Urine: 1.016 (ref 1.005–1.030)
pH: 5 (ref 5.0–8.0)

## 2020-09-16 LAB — CBC WITH DIFFERENTIAL/PLATELET
Abs Immature Granulocytes: 0.04 10*3/uL (ref 0.00–0.07)
Basophils Absolute: 0.1 10*3/uL (ref 0.0–0.1)
Basophils Relative: 1 %
Eosinophils Absolute: 0.3 10*3/uL (ref 0.0–0.5)
Eosinophils Relative: 2 %
HCT: 41.7 % (ref 36.0–46.0)
Hemoglobin: 13.5 g/dL (ref 12.0–15.0)
Immature Granulocytes: 0 %
Lymphocytes Relative: 34 %
Lymphs Abs: 4.3 10*3/uL — ABNORMAL HIGH (ref 0.7–4.0)
MCH: 26.5 pg (ref 26.0–34.0)
MCHC: 32.4 g/dL (ref 30.0–36.0)
MCV: 81.9 fL (ref 80.0–100.0)
Monocytes Absolute: 0.9 10*3/uL (ref 0.1–1.0)
Monocytes Relative: 7 %
Neutro Abs: 7 10*3/uL (ref 1.7–7.7)
Neutrophils Relative %: 56 %
Platelets: 408 10*3/uL — ABNORMAL HIGH (ref 150–400)
RBC: 5.09 MIL/uL (ref 3.87–5.11)
RDW: 12.4 % (ref 11.5–15.5)
WBC: 12.5 10*3/uL — ABNORMAL HIGH (ref 4.0–10.5)
nRBC: 0 % (ref 0.0–0.2)

## 2020-09-16 LAB — HCG, QUANTITATIVE, PREGNANCY: hCG, Beta Chain, Quant, S: 9462 m[IU]/mL — ABNORMAL HIGH (ref <5)

## 2020-09-16 MED ORDER — KETOROLAC TROMETHAMINE 60 MG/2ML IM SOLN
60.0000 mg | Freq: Once | INTRAMUSCULAR | Status: AC
  Administered 2020-09-16: 60 mg via INTRAMUSCULAR
  Filled 2020-09-16: qty 2

## 2020-09-16 NOTE — MAU Provider Note (Signed)
History     CSN: [id number]  Arrival date and time: 09/16/20 2116   None     Chief Complaint  Patient presents with   Vaginal Bleeding   HPI  Maria Daniel is a 39 y.o. female [phone] @ [redacted]w[redacted]d here with vaginal bleeding that started today. Hx of tubal ligation.  Interpretor present for HPI. Reports starting off as light bleeding, now heavy. She reports lower back pain that started at the same time. She reports her pain 8/10.   She had an [place] on 10/13 that showed IUP with yolk sac, she is scheduled for a repeat on 10/25.   OB History    Gravida  7   Para  4   Term  4   Preterm      AB  2   Living  3     SAB  2   TAB      Ectopic      Multiple  0   Live Births  3           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   Diabetes mellitus without complication (HCC)    Dyspnea    Gestational diabetes    Hypertension    no meds patient denies   Hyperthyroidism    Insomnia    Knee pain    Miscarriage     Past Surgical History:  Procedure Laterality Date   CESAREAN SECTION     CESAREAN SECTION  08/17/2012   Procedure: CESAREAN SECTION;  Surgeon: Lesly Dukes, MD;  Location: [facility];  Service: Obstetrics;  Laterality: N/A;   CESAREAN SECTION N/A 04/23/2016   Procedure: CESAREAN SECTION;  Surgeon: Catalina Antigua, MD;  Location: [facility];  Service: Obstetrics;  Laterality: N/A;   CESAREAN SECTION N/A 07/28/2018   Procedure: REPEAT CESAREAN SECTION;  Surgeon: Catalina Antigua, MD;  Location: [facility];  Service: Obstetrics;  Laterality: N/A;   CESAREAN SECTION WITH BILATERAL TUBAL LIGATION Bilateral 07/28/2018   Procedure: BILATERAL TUBAL LIGATION;  Surgeon: Catalina Antigua, MD;  Location: [facility];  Service: Obstetrics;  Laterality: Bilateral;   LESION REMOVAL N/A 11/11/2016   Procedure:  EXCISION OF SUBCUTANEOUS LESION/ ABDOMEN;  Surgeon: Hermina Staggers, MD;  Location: [facility];  Service: Gynecology;  Laterality: N/A;    Family History  Problem Relation Age of Onset   Hypertension Mother    Kidney disease Father    Multiple sclerosis Father     Social History   Tobacco Use   Smoking status: Never Smoker   Smokeless tobacco: Never Used  Vaping Use   Vaping Use: Never used  Substance Use Topics   Alcohol use: No    Comment: socially   Drug use: No    Allergies: No Known Allergies  Medications Prior to Admission  Medication Sig Dispense Refill Last Dose   ibuprofen (ADVIL) 800 MG tablet Take 800 mg by mouth every 8 (eight) hours as needed.   09/16/2020 at Unknown time   acetaminophen (TYLENOL) 500 MG tablet Take 2 tablets (1,000 mg total) by mouth every 6 (six) hours as needed. 100 tablet 0    Prenatal Vit-Fe Fumarate-FA (PRENATAL VITAMINS) 28-0.8 MG TABS Take 1 tablet by mouth daily. 60 tablet 3    Results for orders placed or performed during the hospital encounter of 09/16/20 (from  the past 48 hour(s))  Urinalysis, Routine w reflex microscopic Urine, Clean Catch     Status: Abnormal   Collection Time: 09/16/20 10:11 PM  Result Value Ref Range   Color, Urine YELLOW YELLOW   APPearance HAZY (A) CLEAR   Specific Gravity, Urine 1.016 1.005 - 1.030   pH 5.0 5.0 - 8.0   Glucose, UA NEGATIVE NEGATIVE mg/dL   Hgb urine dipstick LARGE (A) NEGATIVE   Bilirubin Urine NEGATIVE NEGATIVE   Ketones, ur NEGATIVE NEGATIVE mg/dL   Protein, ur 30 (A) NEGATIVE mg/dL   Nitrite NEGATIVE NEGATIVE   Leukocytes,Ua TRACE (A) NEGATIVE   RBC / HPF >50 (H) 0 - 5 RBC/hpf   WBC, UA 0-5 0 - 5 WBC/hpf   Bacteria, UA NONE SEEN NONE SEEN   Squamous Epithelial / LPF 6-10 0 - 5   Mucus PRESENT     Comment: Performed at [facility], [address] [place], [place] [address]  hCG, quantitative, pregnancy     Status: Abnormal   Collection Time: 09/16/20 10:44 PM   Result Value Ref Range   hCG, Beta Chain, Quant, S 9,462 (H) <5 mIU/mL    Comment:          GEST. AGE      CONC.  (mIU/mL)   <=1 WEEK        5 - 50     2 WEEKS       50 - 500     3 WEEKS       100 - 10,000     4 WEEKS     1,000 - 30,000     5 WEEKS     3,500 - 115,000   6-8 WEEKS     12,000 - 270,000    12 WEEKS     15,000 - 220,000        FEMALE AND NON-PREGNANT FEMALE:     LESS THAN 5 mIU/mL Performed at [facility], [address] [place], [place] [address]   CBC with Differential/Platelet     Status: Abnormal   Collection Time: 09/16/20 10:44 PM  Result Value Ref Range   WBC 12.5 (H) 4.0 - 10.5 K/uL   RBC 5.09 3.87 - 5.11 MIL/uL   Hemoglobin 13.5 12.0 - 15.0 g/dL   HCT 02.741.7 36 - 46 %   MCV 81.9 80.0 - 100.0 fL   MCH 26.5 26.0 - 34.0 pg   MCHC 32.4 30.0 - 36.0 g/dL   RDW 25.312.4 66.411.5 - 40.315.5 %   Platelets 408 (H) 150 - 400 K/uL   nRBC 0.0 0.0 - 0.2 %   Neutrophils Relative % 56 %   Neutro Abs 7.0 1.7 - 7.7 K/uL   Lymphocytes Relative 34 %   Lymphs Abs 4.3 (H) 0.7 - 4.0 K/uL   Monocytes Relative 7 %   Monocytes Absolute 0.9 0.1 - 1.0 K/uL   Eosinophils Relative 2 %   Eosinophils Absolute 0.3 0.0 - 0.5 K/uL   Basophils Relative 1 %   Basophils Absolute 0.1 0.0 - 0.1 K/uL   Immature Granulocytes 0 %   Abs Immature Granulocytes 0.04 0.00 - 0.07 K/uL    Comment: Performed at [facility], [address] [place], [place] [address]   US MaineOB Comp Less 14 Wks  Result Date: 09/17/2020 CLINICAL DATA:  Bleeding and pain. Estimated gestational age by LMP is 11 weeks 3 days. Quantitative beta HCG is 9,462. EXAM: OBSTETRIC <14 WK  [place] AND TRANSVAGINAL OB US TECHNIQUE: Both transabdominal and transvaginal ultrasound examinations were performed for complete evaluation of the gestation as well as the maternal uterus, adnexal regions, and pelvic cul-de-sac. Transvaginal technique was performed to assess early pregnancy. COMPARISON:  08/13/2020 FINDINGS: Intrauterine gestational  sac: A single intrauterine gestational sac is present. Yolk sac:  The yolk sac is not identified. Embryo:  Fetal pole is identified. Cardiac Activity: No fetal cardiac activity is identified. CRL:  2.7 mm mm   5 w   5 d                  [place] EDC: 05/14/2021 Subchorionic hemorrhage: A small subchorionic hemorrhage is identified. Maternal uterus/adnexae: The uterus is anteverted. No myometrial mass lesions identified. Ovaries are not well seen but no abnormal adnexal masses are identified. No free fluid in the pelvis. IMPRESSION: A single intrauterine gestational sac is identified with small fetal pole. Estimated gestational age by crown-rump length is about 5 weeks 5 days. This is discordant with the clinical dates and with the gestational sac measurement from the previous study of 08/13/2020. No fetal cardiac activity is identified. Appearance is suspicious for intrauterine fetal demise. Electronically Signed   By: Burman Nieves M.D.   On: 09/17/2020 00:19   US OB Transvaginal  Result Date: 09/17/2020 CLINICAL DATA:  Bleeding and pain. Estimated gestational age by LMP is 11 weeks 3 days. Quantitative beta HCG is 9,462. EXAM: OBSTETRIC <14 WK [place] AND TRANSVAGINAL OB US TECHNIQUE: Both transabdominal and transvaginal ultrasound examinations were performed for complete evaluation of the gestation as well as the maternal uterus, adnexal regions, and pelvic cul-de-sac. Transvaginal technique was performed to assess early pregnancy. COMPARISON:  08/13/2020 FINDINGS: Intrauterine gestational sac: A single intrauterine gestational sac is present. Yolk sac:  The yolk sac is not identified. Embryo:  Fetal pole is identified. Cardiac Activity: No fetal cardiac activity is identified. CRL:  2.7 mm mm   5 w   5 d                  [place] EDC: 05/14/2021 Subchorionic hemorrhage: A small subchorionic hemorrhage is identified. Maternal uterus/adnexae: The uterus is anteverted. No myometrial mass lesions identified. Ovaries are not  well seen but no abnormal adnexal masses are identified. No free fluid in the pelvis. IMPRESSION: A single intrauterine gestational sac is identified with small fetal pole. Estimated gestational age by crown-rump length is about 5 weeks 5 days. This is discordant with the clinical dates and with the gestational sac measurement from the previous study of 08/13/2020. No fetal cardiac activity is identified. Appearance is suspicious for intrauterine fetal demise. Electronically Signed   By: Burman Nieves M.D.   On: 09/17/2020 00:19    Review of Systems  Constitutional: Negative for fever.  Gastrointestinal: Positive for abdominal pain.  Genitourinary: Positive for vaginal bleeding.   Physical Exam   Blood pressure 137/81, pulse 76, temperature 99.3 F (37.4 C), temperature source Oral, resp. rate 18, height 5\' 3"  (1.6 m), weight 117.5 kg, last menstrual period 06/28/2020, unknown if currently breastfeeding.  Physical Exam Constitutional:      General: She is not in acute distress.    Appearance: Normal appearance. She is not ill-appearing or toxic-appearing.  HENT:     Head: Normocephalic.  Genitourinary:    Comments: Vagina - Small amount of dark red blood in the canal. Cervix - +small active bleeding.  Bimanual exam: Cervix closed, uterus enlarged. Chaperone present for exam.  Neurological:  Mental Status: She is alert and oriented to person, place, and time.  Psychiatric:        Behavior: Behavior normal.    MAU Course  Procedures  None  MDM  O positive blood type  Quant 9/18: 6,262, Quant today 9,462 Toradol 60 mg IM: continues to rate pain 8/10 following transvaginal US.  Vicodin 2 tablets given prior to DC home.  Fetal pole now identified without cardiac activity. Dating [redacted]w[redacted]d and is not consistent with dating.  Given abnormal rise in hormonal levels, bleeding and pain; concerning for SAB in progress. Counseled patient about this. Unplanned however, desired. No  treatment today.   Assessment and Plan   A:  1. Threatened miscarriage   2. Vaginal bleeding during pregnancy   3. Vaginal bleeding in pregnancy     P:  Discharge home in stable condition Rx: ibuprofen, vicodin 1 week Quant in the office at [facility], message sent to the office Bleeding precautions Return to MAU If symptoms worsen  Sara Keys, Harolyn Rutherford, NP 09/17/2020 12:49 AM

## 2020-09-16 NOTE — MAU Note (Signed)
PT SAYS  WITH RAQUEL - INTERPRETER.   PT HAD DARK BROWN BLEEDING - AT 5PM-  THEN AT 730PM- SAW BRIGHT RED BLOOD - WHEN SHE WIPES. PAD ON IN TRIAGE - 1 RED DOT. STARTED HAVING CRAMPING - 1 WEEK AGO- TONIGHT WORSE. TOOK IBUPROFEN 800 MG AT 10AM- SOME RELIEF. [facility] WITH CLINIC.

## 2020-09-16 NOTE — MAU Note (Signed)
Pt reports she received an ultrasound on the 18th of September embryonic sac was noted - pt returned on 10/13 for prenatal appointment and ultrasound showed no embryo and pregnancy was not located. Pt reports bleeding that began tonight with lower uterine cramping

## 2020-09-17 MED ORDER — HYDROCODONE-ACETAMINOPHEN 5-325 MG PO TABS
2.0000 | ORAL_TABLET | Freq: Once | ORAL | Status: AC
  Administered 2020-09-17: 2 via ORAL
  Filled 2020-09-17: qty 2

## 2020-09-17 MED ORDER — IBUPROFEN 600 MG PO TABS: 600.0000 mg | ORAL_TABLET | Freq: Four times a day (QID) | ORAL | 0 refills | Status: DC | PRN

## 2020-09-17 MED ORDER — HYDROCODONE-ACETAMINOPHEN 5-325 MG PO TABS: 1.0000 | ORAL_TABLET | Freq: Four times a day (QID) | ORAL | 0 refills | Status: DC | PRN

## 2020-09-17 NOTE — Discharge Instructions (Signed)
Amenaza de aborto °Threatened Miscarriage ° °La amenaza de aborto se produce cuando una mujer tiene hemorragia vaginal durante las primeras 20 semanas de embarazo, pero el embarazo no se interrumpe. Si durante este período usted tiene hemorragia vaginal, el médico le hará pruebas para asegurarse de que el embarazo continúe. Si las pruebas muestran que usted continúa embarazada y que el "bebé" en desarrollo (feto) dentro del útero sigue creciendo, se considera que tuvo una amenaza de aborto. °La amenaza de aborto no implica que el embarazo vaya a terminar, pero sí aumenta el riesgo de perder el embarazo (aborto completo). °¿Cuáles son las causas? °Por lo general, se desconoce la causa de esta afección. Si el resultado final es el aborto completo, la causa más frecuente es la cantidad anormal de cromosomas del feto. Los cromosomas son las estructuras internas de las células que contienen todo el material genético de una persona. °¿Qué incrementa el riesgo? °Los siguientes factores del estilo de vida pueden aumentar el riesgo de aborto al comienzo del embarazo: °· Fumar. °· El consumo de cantidades excesivas de alcohol o cafeína. °· El consumo de drogas. °Las siguientes enfermedades preexistentes pueden aumentar el riesgo de aborto al comienzo del embarazo: °· Síndrome del ovario poliquístico. °· Fibromas uterinos. °· Infecciones. °· Diabetes mellitus. °¿Cuáles son los signos o los síntomas? °Los síntomas de esta afección incluyen los siguientes: °· Hemorragia vaginal. °· Dolor o cólicos abdominales leves. °¿Cómo se diagnostica? °Si tiene hemorragia con o sin dolor abdominal antes de las 20 semanas de embarazo, el médico le hará pruebas para determinar si el embarazo continúa. Estas incluirán lo siguiente: °· Ecografía. Este estudio usa ondas sonoras para crear imágenes del interior del útero. Esto permite que el médico vea al bebé en gestación y otras estructuras, como la placenta. °· Examen pélvico. Este es un examen  interno de la vagina y del cuello uterino. °· Medición de la frecuencia cardíaca del feto. °· Pruebas de laboratorio, como análisis de sangre, análisis de orina o hisopados para detectar una infección. °Es posible que le diagnostiquen una amenaza de aborto en los siguientes casos: °· La ecografía muestra que el embarazo continúa. °· La frecuencia cardíaca del feto es alta. °· El examen pélvico muestra que la apertura entre el útero y la vagina (cuello uterino) está cerrada. °· Los análisis de sangre confirman que el embarazo continúa. °¿Cómo se trata? °No se ha demostrado que ningún tratamiento evite que una amenaza de aborto se convierta en un aborto completo. Sin embargo, los cuidados adecuados en el hogar son importantes. °Siga estas indicaciones en su casa: °· Descanse lo suficiente. °· No tenga relaciones sexuales ni use tampones si tiene hemorragia vaginal. °· No se haga duchas vaginales. °· No fume ni consuma drogas. °· No beba alcohol. °· Evite la cafeína. °· Vaya a todas las visitas de control prenatales y de control como se lo haya indicado el médico. Esto es importante. °Comuníquese con un médico si: °· Tiene una ligera hemorragia o manchado vaginal durante el embarazo. °· Tiene dolor o cólicos en el abdomen. °· Tiene fiebre. °Solicite ayuda de inmediato si: °· Tiene una hemorragia vaginal abundante. °· Elimina coágulos de sangre por la vagina. °· Elimina tejidos por la vagina. °· Tiene una pérdida de líquido o le sale líquido a chorros por la vagina. °· Siente dolor en la parte baja de la espalda o cólicos abdominales intensos. °· Tiene fiebre, escalofríos y dolor abdominal intenso. °Resumen °· La amenaza de aborto se produce cuando una mujer tiene hemorragia   vaginal durante las primeras 20 semanas de embarazo, pero el embarazo no se interrumpe. °· Por lo general, no se conoce la causa de la amenaza de aborto. °· Entre los síntomas de esta afección, se incluyen hemorragia vaginal y cólicos o dolor  abdominal leve. °· No se ha demostrado que ningún tratamiento evite que una amenaza de aborto se convierta en un aborto completo. °· Vaya a todas las visitas de control prenatales y de control como se lo haya indicado el médico. Esto es importante. °Esta información no tiene como fin reemplazar el consejo del médico. Asegúrese de hacerle al médico cualquier pregunta que tenga. °Document Revised: 08/01/2017 Document Reviewed: 08/01/2017 °Elsevier Patient Education © 2020 [employer] ° °

## 2020-09-18 ENCOUNTER — Ambulatory Visit: Payer: Self-pay

## 2020-09-18 ENCOUNTER — Encounter: Payer: Self-pay | Admitting: Obstetrics and Gynecology

## 2020-09-19 ENCOUNTER — Telehealth: Payer: Self-pay | Admitting: Lactation Services

## 2020-09-19 ENCOUNTER — Other Ambulatory Visit: Payer: Self-pay | Admitting: Student

## 2020-09-19 ENCOUNTER — Telehealth: Payer: Self-pay

## 2020-09-19 MED ORDER — OXYCODONE HCL 5 MG PO CAPS: 5.0000 mg | ORAL_CAPSULE | ORAL | 0 refills | Status: DC | PRN

## 2020-09-19 NOTE — Telephone Encounter (Signed)
Access Nurse RN called nurse phone. RN reports patient is on other line saying medications are not at pharmacy. Per chart review pain medications are to be sent by Crisoforo Oxford, CNM. Requested RN verify correct pharmacy with patient and let her know they will be sent. Followed up with Crisoforo Oxford who states these are being sent now.

## 2020-09-19 NOTE — Progress Notes (Signed)
Patient called office reporting extreme pain with bleeding, diagnosed with possible miscarriage on 10/23. Patient reporting that she knows she is having a miscarriage and would like more pain medicine. Patient sent 5 tabs of oxycodone; requested that RN schedule patient for miscarriage follow up and quant.   Luna Kitchens

## 2020-09-19 NOTE — Telephone Encounter (Signed)
Patient called and LM on nurse voicemail. She is requesting a call back about a possible miscarriage?

## 2020-09-19 NOTE — Telephone Encounter (Addendum)
Returned patients call with assistance of [employer] Ellie Lunch # [id number].   Patient reports that she was supposed to be [redacted] weeks pregnant and [place] are showing that there was no fetal heartbeat and only [redacted] weeks pregnant. Patient reports she went to the MAU and was told she was most likely suffering a miscarriage. She was given pain medication for pain and she is about out and pain medication is not helping.   She reports she had bleeding and increased pain this morning. She reports the pain is severe at times and she cries. She is concerned she is about out of pain medications. She reports she is passing clots this morning and a lot of blood. She reports she is having contractions and a headache.   She reports she took Ibuprofen 800 mg and reports it did not help much and that the Vicodin also did not help with the pain, she was taking them every 4 hours for the pain and has run out. She is requesting Oxycodone for pain as the pain is more than she can tolerate. She reports the pain is 9/10 and is sweating with the pain. Since this morning, She is changing her pads and diapers every hour, she reports they are moderately filled with clots of varying sizes and colors like a tortilla or like liver.   She reports she was told not to come back to the hospital and to follow up in the office. She reports she does have 3 small children at home.   Spoke with Luna Kitchens, CNM who is ordering Oxycodone and Ibuprofen for pain. Reviewed if pain continues and is unbearable or if bleeding increases, she can go to MAU for reevaluation.patient informed to call the office if she is not better tomorrow.  Patient voiced understanding.   Patient to follow up in the office on 11/5. Can combine levels on the in person visit per K. Kooistra. Message to front desk to reschedule appt.

## 2020-09-20 ENCOUNTER — Encounter (HOSPITAL_COMMUNITY): Payer: Self-pay | Admitting: Obstetrics & Gynecology

## 2020-09-20 ENCOUNTER — Other Ambulatory Visit: Payer: Self-pay

## 2020-09-20 ENCOUNTER — Telehealth: Payer: Self-pay | Admitting: Lactation Services

## 2020-09-20 ENCOUNTER — Inpatient Hospital Stay (HOSPITAL_COMMUNITY)
Admission: AD | Admit: 2020-09-20 | Discharge: 2020-09-20 | Disposition: A | Discharge status: Home / Self Care | Payer: Self-pay | Attending: Obstetrics & Gynecology | Admitting: Obstetrics & Gynecology

## 2020-09-20 ENCOUNTER — Inpatient Hospital Stay (HOSPITAL_COMMUNITY): Payer: Self-pay

## 2020-09-20 DIAGNOSIS — N939 Abnormal uterine and vaginal bleeding, unspecified: Secondary | ICD-10-CM

## 2020-09-20 DIAGNOSIS — O039 Complete or unspecified spontaneous abortion without complication: Secondary | ICD-10-CM | POA: Insufficient documentation

## 2020-09-20 LAB — CBC
HCT: 37.4 % (ref 36.0–46.0)
Hemoglobin: 11.8 g/dL — ABNORMAL LOW (ref 12.0–15.0)
MCH: 26.3 pg (ref 26.0–34.0)
MCHC: 31.6 g/dL (ref 30.0–36.0)
MCV: 83.5 fL (ref 80.0–100.0)
Platelets: 381 10*3/uL (ref 150–400)
RBC: 4.48 MIL/uL (ref 3.87–5.11)
RDW: 12.7 % (ref 11.5–15.5)
WBC: 9.1 10*3/uL (ref 4.0–10.5)
nRBC: 0 % (ref 0.0–0.2)

## 2020-09-20 MED ORDER — KETOROLAC TROMETHAMINE 60 MG/2ML IM SOLN
60.0000 mg | Freq: Once | INTRAMUSCULAR | Status: AC
  Administered 2020-09-20: 60 mg via INTRAMUSCULAR
  Filled 2020-09-20: qty 2

## 2020-09-20 MED ORDER — IBUPROFEN 800 MG PO TABS: 800.0000 mg | ORAL_TABLET | Freq: Three times a day (TID) | ORAL | 3 refills | Status: DC | PRN

## 2020-09-20 MED ORDER — IBUPROFEN 600 MG PO TABS: 600.0000 mg | ORAL_TABLET | Freq: Four times a day (QID) | ORAL | 0 refills | Status: DC | PRN

## 2020-09-20 NOTE — MAU Provider Note (Signed)
Patient Maria Daniel is a 39 y.o.  [id number]  at [redacted]w[redacted]d here with complaints of pain and vaginal bleeding. She was diagnosed with threatened miscarriage on 09/16/2020. Reports that she started bleeding yesterday morning and has had more and more pain and bleeding since then. She called [facility] yesterday describing the start of her bleeding and request pain medicine. Patient was given 5 Oxycodone tablets (5 mg) and instructed to come to MAU if pain worsens or bleeding worsens.   She is here today because she thinks that she passed the fetus this afternoon. She is carrying POC in a plastic bag.   She denies SOB, fever, weakness, other complaints. She is a CHTN not on medicine.    History     CSN: [id number]  Arrival date and time: 09/20/20 1545   None     Chief Complaint  Patient presents with  . Vaginal Bleeding   Vaginal Bleeding The patient's primary symptoms include vaginal bleeding. This is a new problem. The current episode started yesterday. The problem occurs constantly. The problem has been unchanged. The problem affects the left side. She is pregnant. Associated symptoms include abdominal pain. Pertinent negatives include no constipation or diarrhea. The vaginal discharge was bloody. The vaginal bleeding is heavier than menses. She has been passing clots. She has been passing tissue.  Abdominal Pain This is a new problem. The current episode started yesterday. The problem occurs constantly. The pain is at a severity of 8/10. The quality of the pain is cramping. Pain radiation: radiates down her leg from her LLQ to "the tip of her toes" Pertinent negatives include no constipation or diarrhea.   OB History    Gravida  7   Para  4   Term  4   Preterm      AB  2   Living  3     SAB  2   TAB      Ectopic      Multiple  0   Live Births  3           Past Medical History:  Diagnosis Date  . Complication of anesthesia    had trouble with spinal not working  with last cs does not want a learner to do spinal  . Diabetes mellitus without complication (HCC)   . Dyspnea   . Gestational diabetes   . Hypertension    no meds patient denies  . Hyperthyroidism   . Insomnia   . Knee pain   . Miscarriage     Past Surgical History:  Procedure Laterality Date  . CESAREAN SECTION    . CESAREAN SECTION  08/17/2012   Procedure: CESAREAN SECTION;  Surgeon: Lesly Dukes, MD;  Location: [facility];  Service: Obstetrics;  Laterality: N/A;  . CESAREAN SECTION N/A 04/23/2016   Procedure: CESAREAN SECTION;  Surgeon: Catalina Antigua, MD;  Location: [facility];  Service: Obstetrics;  Laterality: N/A;  . CESAREAN SECTION N/A 07/28/2018   Procedure: REPEAT CESAREAN SECTION;  Surgeon: Catalina Antigua, MD;  Location: [facility];  Service: Obstetrics;  Laterality: N/A;  . CESAREAN SECTION WITH BILATERAL TUBAL LIGATION Bilateral 07/28/2018   Procedure: BILATERAL TUBAL LIGATION;  Surgeon: Catalina Antigua, MD;  [facility]: [facility];  Service: Obstetrics;  Laterality: Bilateral;  . LESION REMOVAL N/A 11/11/2016   Procedure: EXCISION OF SUBCUTANEOUS LESION/ ABDOMEN;  Surgeon: Hermina Staggers, MD;  Location: [facility];  Service: Gynecology;  Laterality: N/A;    Family History  Problem  Relation Age of Onset  . Hypertension Mother   . Kidney disease Father   . Multiple sclerosis Father     Social History   Tobacco Use  . Smoking status: Never Smoker  . Smokeless tobacco: Never Used  Vaping Use  . Vaping Use: Never used  Substance Use Topics  . Alcohol use: No    Comment: socially  . Drug use: No    Allergies: No Known Allergies  Medications Prior to Admission  Medication Sig Dispense Refill Last Dose  . acetaminophen (TYLENOL) 500 MG tablet Take 2 tablets (1,000 mg total) by mouth every 6 (six) hours as needed. 100 tablet 0   . HYDROcodone-acetaminophen (NORCO/VICODIN) 5-325 MG tablet Take 1-2 tablets by mouth every 6 (six) hours as needed for  moderate pain. 10 tablet 0   . ibuprofen (ADVIL) 600 MG tablet Take 1 tablet (600 mg total) by mouth every 6 (six) hours as needed. 12 tablet 0   . oxycodone (OXY-IR) 5 MG capsule Take 1 capsule (5 mg total) by mouth every 4 (four) hours as needed. 5 capsule 0   . Prenatal Vit-Fe Fumarate-FA (PRENATAL VITAMINS) 28-0.8 MG TABS Take 1 tablet by mouth daily. 60 tablet 3     Review of Systems  Gastrointestinal: Positive for abdominal pain. Negative for constipation and diarrhea.  Genitourinary: Positive for vaginal bleeding.   Physical Exam   Blood pressure (!) 142/78, pulse 72, temperature 98.8 F (37.1 C), resp. rate 18, last menstrual period 06/28/2020, unknown if currently breastfeeding.  Physical Exam Constitutional:      Appearance: Normal appearance.  HENT:     Head: Normocephalic.  Pulmonary:     Effort: Pulmonary effort is normal.  Abdominal:     General: Abdomen is flat.  Genitourinary:    General: Normal vulva.     Vagina: Vaginal discharge present.     Comments: NEFG; dark red blood in the vagina but no clots, no tissue. Cervix is FT, non-tender. Uterus is slightly enlarged.  Musculoskeletal:        General: Swelling present. No tenderness.     Right lower leg: Edema present.     Left lower leg: Edema present.  Skin:    General: Skin is warm.  Neurological:     General: No focal deficit present.     Mental Status: She is alert.     MAU Course  Procedures  MDM -Toradol 60 Mg IM given> patient reports pain is now 4/10. She is ambulating in the room, talking and laughing with visitor. Does not appear in any distress.   -US shows that miscarriage is complete; thickened endometrium of 19 mm but patient declines cytotec  -will send uterine contents to pathology  Patient Vitals for the past 24 hrs:  BP Temp Pulse Resp  09/20/20 1624 (!) 142/78 98.8 F (37.1 C) 72 18   -Hemoglobin is stable at 11.8 Assessment and Plan   1. Miscarriage   2. Vaginal bleeding     2. Patient stable for discharge, patient is well-appearing and pain is wellcontrolled. She is asking for ibuprofen plus refills.   3. Patient to keep appt on 11/8 with Dr. Alvester Morin, can repeat bHCG then if appropriate. Message sent to clinic to cancel lab visit on 11/1  4. Reviewed signs of infection and when to come to MAU, including bleeding precautions. All questions answered.     Charlesetta Garibaldi Hannahgrace Lalli 09/20/2020, 6:29 PM

## 2020-09-20 NOTE — Telephone Encounter (Signed)
Patient called with concerns while experiencing a miscarriage. Spoke with her with assistance of [employer], [employer], trade [employer].   She reports she is still  having a lot of pain and is passing tissue today. She is concerned that she has passed an ovary.  Patient is crying and reports her pain is 10/10. Patient sent in pictures of passed products via My Chart.   Case reviewed with Dr. Vergie Living and it was recommended patient go to the MAU for evaluation due to severe pain. Patient was reassured that what she has passed would be products of conception not her ovary.   Patient is to call her sister to watch the older children and go to MAU.   Called report to [facility] in MAU that patient will be coming in for evaluation.

## 2020-09-20 NOTE — MAU Note (Signed)
Pt reprots she started having heavy vaginal bleeding that started yesterday. C/o abd pain/cramping also. Took some oxycodone for pain but only helped a little. Passed POC brough in  Possible gestional sac?

## 2020-09-20 NOTE — Discharge Instructions (Signed)
Aborto espontneo Miscarriage El aborto espontneo es la prdida de un beb que no ha nacido (feto) antes de la semana20 del embarazo. Siga estas indicaciones en su casa: Medicamentos   Tome los medicamentos de venta libre y los recetados solamente como se lo haya indicado el mdico.  Si le recetaron un antibitico, tmelo como se lo haya indicado el mdico. No deje de tomar los antibiticos aunque comience a sentirse mejor.  No tome antiinflamatorios no esteroideos (AINE), a menos que el mdico le diga que son seguros para usted. Estos incluyen aspirina e ibuprofeno. Estos medicamentos pueden provocarle sangrado. Actividad  Haga reposo segn lo indicado. Pregntele al mdico qu actividades son seguras para usted.  Pida ayuda para realizar las tareas de la casa durante este tiempo. Instrucciones generales  Anote cuntos apsitos usa por da y cun saturados estn.  Observe la cantidad de tejido o grumos de sangre (cogulos de sangre) que expulsa por la vagina. Guarde las cantidades grandes de tejido para llevrselas al mdico.  No use tampones, no se haga duchas vaginales ni tenga relaciones sexuales hasta que el mdico la autorice.  Para que usted y su pareja puedan sobrellevar el proceso de duelo, hable con su mdico o busque apoyo psicolgico.  Cuando est lista, acuda al mdico para hablar sobre los pasos que debe seguir para cuidar su salud. Adems, hable con su mdico sobre las medidas que debe adoptar para tener un embarazo saludable en el futuro.  Concurra a todas las visitas de seguimiento como se lo haya indicado el mdico. Esto es importante. Comunquese con un mdico si:  Tiene fiebre o siente escalofros.  Tiene una secrecin vaginal con mal olor.  Aumenta el sangrado. Solicite ayuda de inmediato si:  Tiene espasmos o dolor muy intensos en el abdomen o en la espalda.  Elimina grumos de sangre por la vagina, que tienen el tamao de una nuez o ms.  Elimina  tejido por la vagina, que tiene el tamao de una nuez o ms.  Empapa ms de un apsito de tamao normal por hora.  Se siente dbil o mareada.  Pierde el conocimiento (se desmaya).  Siente tristeza que no se va o piensa en lastimarse. Resumen  El aborto espontneo es la prdida de un beb que no ha nacido antes de la semana20 del embarazo.  Siga las indicaciones de su mdico para el cuidado en su hogar. Concurra a todas las visitas de control.  Para que usted y su pareja puedan sobrellevar el proceso de duelo, hable con su mdico o busque apoyo psicolgico. Esta informacin no tiene como fin reemplazar el consejo del mdico. Asegrese de hacerle al mdico cualquier pregunta que tenga. Document Revised: 08/18/2017 Document Reviewed: 08/18/2017 Elsevier Patient Education  2020 Elsevier Inc.  

## 2020-09-22 LAB — SURGICAL PATHOLOGY

## 2020-09-25 ENCOUNTER — Other Ambulatory Visit: Payer: Self-pay

## 2020-10-02 ENCOUNTER — Ambulatory Visit: Payer: Self-pay | Admitting: Family Medicine

## 2021-06-26 ENCOUNTER — Other Ambulatory Visit (INDEPENDENT_AMBULATORY_CARE_PROVIDER_SITE_OTHER): Payer: Self-pay

## 2021-07-02 ENCOUNTER — Telehealth: Payer: Self-pay

## 2021-07-02 NOTE — Telephone Encounter (Signed)
Refill request for Tylenol received through MyChart. Called pt with interpreter Claudia. VM left stating I am calling regarding medication refill request.

## 2022-12-31 ENCOUNTER — Ambulatory Visit: Payer: Self-pay | Attending: Nurse Practitioner | Admitting: Nurse Practitioner

## 2022-12-31 ENCOUNTER — Encounter: Payer: Self-pay | Admitting: Nurse Practitioner

## 2022-12-31 VITALS — BP 144/95 | HR 86 | Ht 63.0 in | Wt 256.4 lb

## 2022-12-31 DIAGNOSIS — E7800 Pure hypercholesterolemia, unspecified: Secondary | ICD-10-CM

## 2022-12-31 DIAGNOSIS — Z1159 Encounter for screening for other viral diseases: Secondary | ICD-10-CM

## 2022-12-31 DIAGNOSIS — I10 Essential (primary) hypertension: Secondary | ICD-10-CM

## 2022-12-31 DIAGNOSIS — K089 Disorder of teeth and supporting structures, unspecified: Secondary | ICD-10-CM

## 2022-12-31 DIAGNOSIS — Z862 Personal history of diseases of the blood and blood-forming organs and certain disorders involving the immune mechanism: Secondary | ICD-10-CM

## 2022-12-31 DIAGNOSIS — Z8632 Personal history of gestational diabetes: Secondary | ICD-10-CM

## 2022-12-31 DIAGNOSIS — E079 Disorder of thyroid, unspecified: Secondary | ICD-10-CM

## 2022-12-31 DIAGNOSIS — Z23 Encounter for immunization: Secondary | ICD-10-CM

## 2022-12-31 MED ORDER — IBUPROFEN 800 MG PO TABS: 800.0000 mg | ORAL_TABLET | Freq: Three times a day (TID) | ORAL | 3 refills | Status: DC | PRN

## 2022-12-31 MED ORDER — VALSARTAN 40 MG PO TABS: 40.0000 mg | ORAL_TABLET | Freq: Every day | ORAL | 3 refills | Status: DC

## 2022-12-31 NOTE — Progress Notes (Signed)
Assessment & Plan:  Dhamar was seen today for establish care.  Diagnoses and all orders for this visit:  Primary hypertension -     CMP14+EGFR -     valsartan (DIOVAN) 40 MG tablet; Take 1 tablet (40 mg total) by mouth daily. Continue all antihypertensives as prescribed.  Reminded to bring in blood pressure log for follow  up appointment.  RECOMMENDATIONS: DASH/Mediterranean Diets are healthier choices for HTN.    Need for hepatitis C screening test -     HCV Ab w Reflex to Quant PCR  History of anemia -     CBC with Differential  Thyroid disease -     Thyroid Panel With TSH  History of gestational diabetes -     Hemoglobin A1c -     CMP14+EGFR -     Microalbumin / creatinine urine ratio  Pure hypercholesterolemia -     Lipid panel  Need for immunization against influenza -     Flu Vaccine QUAD 45mo+IM (Fluarix, Fluzone & Alfiuria Quad PF)  Poor dentition -     ibuprofen (ADVIL) 800 MG tablet; Take 1 tablet (800 mg total) by mouth every 8 (eight) hours as needed.    Patient has been counseled on age-appropriate routine health concerns for screening and prevention. These are reviewed and up-to-date. Referrals have been placed accordingly. Immunizations are up-to-date or declined.    Subjective:   Chief Complaint  Patient presents with   Establish Care   HPI Maria Daniel 42 y.o. female presents to office today to establish care  There is an onsite interpreter accompanying her today.  She has a history of HTN and gestational diabetes. Currently not taking any medications of either  HTN Blood pressure is elevated today.  She has taken amlodipine 10 mg in the past and we will resume today.  BP Readings from Last 3 Encounters:  12/31/22 (!) 144/95  09/20/20 (!) 151/91  09/17/20 (!) 104/57      She endorses bilateral leg weakness. Not associated with any sciatica symptoms. Onset 6 months ago.  Unrelated to any falls or injury.  Falls when she walks too  fast. Denies any dizziness or lightheadedness. She also has complaints of chronic low back pain which is worse with bending or lifting. BMI 45  Has several molars that required root canals which were not successful. Takes ibuprofen sparingly for pain.   Review of Systems  Constitutional:  Negative for fever, malaise/fatigue and weight loss.  HENT: Negative.  Negative for nosebleeds.   Eyes: Negative.  Negative for blurred vision, double vision and photophobia.  Respiratory: Negative.  Negative for cough and shortness of breath.   Cardiovascular: Negative.  Negative for chest pain, palpitations and leg swelling.  Gastrointestinal: Negative.  Negative for heartburn, nausea and vomiting.  Musculoskeletal:  Positive for back pain. Negative for myalgias.  Neurological:  Positive for weakness. Negative for dizziness, focal weakness, seizures and headaches.  Psychiatric/Behavioral: Negative.  Negative for suicidal ideas.     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   Diabetes mellitus without complication ([place])    Dyspnea    Gestational diabetes    Hypertension    no meds patient denies   Hyperthyroidism    Insomnia    Knee pain    Miscarriage     Past Surgical History:  Procedure Laterality Date  CESAREAN SECTION     CESAREAN SECTION  08/17/2012   Procedure: CESAREAN SECTION;  Surgeon: Guss Bunde, MD;  Location: [place] [facility];  Service: Obstetrics;  Laterality: N/A;   CESAREAN SECTION N/A 04/23/2016   Procedure: CESAREAN SECTION;  Surgeon: Mora Bellman, MD;  Location: [place];  Service: Obstetrics;  Laterality: N/A;   CESAREAN SECTION N/A 07/28/2018   Procedure: REPEAT CESAREAN SECTION;  Surgeon: Mora Bellman, MD;  Location: [place];  Service: Obstetrics;  Laterality: N/A;   CESAREAN SECTION WITH BILATERAL TUBAL LIGATION Bilateral 07/28/2018    Procedure: BILATERAL TUBAL LIGATION;  Surgeon: Mora Bellman, MD;  Location: [place];  Service: Obstetrics;  Laterality: Bilateral;   LESION REMOVAL N/A 11/11/2016   Procedure: EXCISION OF SUBCUTANEOUS LESION/ ABDOMEN;  Surgeon: Chancy Milroy, MD;  Location: [facility];  Service: Gynecology;  Laterality: N/A;    Family History  Problem Relation Age of Onset   Hypertension Mother    Kidney disease Father    Multiple sclerosis Father     Social History Reviewed with no changes to be made today.   Outpatient Medications Prior to Visit  Medication Sig Dispense Refill   ibuprofen (ADVIL) 800 MG tablet Take 1 tablet (800 mg total) by mouth every 8 (eight) hours as needed. 30 tablet 3   ibuprofen (ADVIL) 800 MG tablet Take 1 tablet (800 mg total) by mouth every 8 (eight) hours as needed. 30 tablet 3   Prenatal Vit-Fe Fumarate-FA (PRENATAL VITAMINS) 28-0.8 MG TABS Take 1 tablet by mouth daily. 60 tablet 3   No facility-administered medications prior to visit.    No Known Allergies     Objective:    BP (!) 144/95   Pulse 86   Ht 5\' 3"  (1.6 m)   Wt 256 lb 6.4 oz (116.3 kg)   LMP 12/17/2022 (Approximate)   SpO2 98%   BMI 45.42 kg/m  Wt Readings from Last 3 Encounters:  12/31/22 256 lb 6.4 oz (116.3 kg)  09/16/20 259 lb (117.5 kg)  09/06/20 257 lb 6.4 oz (116.8 kg)    Physical Exam Vitals and nursing note reviewed.  Constitutional:      Appearance: She is well-developed. She is obese.  HENT:     Head: Normocephalic and atraumatic.  Cardiovascular:     Rate and Rhythm: Normal rate and regular rhythm.     Heart sounds: Normal heart sounds. No murmur heard.    No friction rub. No gallop.  Pulmonary:     Effort: Pulmonary effort is normal. No tachypnea or respiratory distress.     Breath sounds: Normal breath sounds. No decreased breath sounds, wheezing, rhonchi or rales.  Chest:     Chest wall: No tenderness.  Abdominal:     General: Bowel sounds are normal.      Palpations: Abdomen is soft.  Musculoskeletal:        General: Normal range of motion.     Cervical back: Normal range of motion.  Skin:    General: Skin is warm and dry.  Neurological:     Mental Status: She is alert and oriented to person, place, and time.     Coordination: Coordination normal.  Psychiatric:        Behavior: Behavior normal. Behavior is cooperative.        Thought Content: Thought content normal.        Judgment: Judgment normal.          Patient has been counseled extensively about  nutrition and exercise as well as the importance of adherence with medications and regular follow-up. The patient was given clear instructions to go to ER or return to medical center if symptoms don't improve, worsen or new problems develop. The patient verbalized understanding.   Follow-up: Return for 4-6 weeks BP check and pap smear.   Gildardo Pounds, FNP-BC [facility] [place] [place]   12/31/2022, 1:31 PM

## 2022-12-31 NOTE — Progress Notes (Signed)
Experiencing leg weakness. Right and left.

## 2023-01-01 ENCOUNTER — Other Ambulatory Visit: Payer: Self-pay | Admitting: Nurse Practitioner

## 2023-01-01 ENCOUNTER — Telehealth: Payer: Self-pay | Admitting: Nurse Practitioner

## 2023-01-01 DIAGNOSIS — I10 Essential (primary) hypertension: Secondary | ICD-10-CM

## 2023-01-01 MED ORDER — VALSARTAN 40 MG PO TABS
40.0000 mg | ORAL_TABLET | Freq: Every day | ORAL | 3 refills | Status: DC
  Filled 2023-01-01: qty 90, 90d supply, fill #0
  Filled 2023-03-13 – 2023-04-03 (×2): qty 30, 30d supply, fill #1
  Filled 2023-04-22 – 2023-06-06 (×2): qty 30, 30d supply, fill #2
  Filled 2023-08-25: qty 30, 30d supply, fill #3
  Filled 2023-09-29: qty 30, 30d supply, fill #4
  Filled 2023-11-13: qty 90, 90d supply, fill #5
  Filled 2023-12-09: qty 90, 90d supply, fill #6

## 2023-01-01 NOTE — Telephone Encounter (Signed)
Copied from [place]. Topic: General - Inquiry >> Jan 01, 2023  3:27 PM Ludger Nutting wrote: Patient states that the valsartan (DIOVAN) 40 MG tablet is too expensive and would like to know if there is a more affordable option. Please follow up with patient.

## 2023-01-01 NOTE — Telephone Encounter (Signed)
Sent to [facility] clinic pharmacy

## 2023-01-02 ENCOUNTER — Other Ambulatory Visit: Payer: Self-pay

## 2023-01-03 LAB — MICROALBUMIN / CREATININE URINE RATIO
Creatinine, Urine: 284.7 mg/dL
Microalb/Creat Ratio: 47 mg/g creat — ABNORMAL HIGH (ref 0–29)
Microalbumin, Urine: 133.2 ug/mL

## 2023-01-03 LAB — CBC WITH DIFFERENTIAL/PLATELET
Basophils Absolute: 0.1 10*3/uL (ref 0.0–0.2)
Basos: 1 %
EOS (ABSOLUTE): 0.2 10*3/uL (ref 0.0–0.4)
Eos: 2 %
Hematocrit: 41.6 % (ref 34.0–46.6)
Hemoglobin: 13.6 g/dL (ref 11.1–15.9)
Immature Grans (Abs): 0 10*3/uL (ref 0.0–0.1)
Immature Granulocytes: 0 %
Lymphocytes Absolute: 3 10*3/uL (ref 0.7–3.1)
Lymphs: 25 %
MCH: 26.5 pg — ABNORMAL LOW (ref 26.6–33.0)
MCHC: 32.7 g/dL (ref 31.5–35.7)
MCV: 81 fL (ref 79–97)
Monocytes Absolute: 0.7 10*3/uL (ref 0.1–0.9)
Monocytes: 5 %
Neutrophils Absolute: 8.3 10*3/uL — ABNORMAL HIGH (ref 1.4–7.0)
Neutrophils: 67 %
Platelets: 418 10*3/uL (ref 150–450)
RBC: 5.13 x10E6/uL (ref 3.77–5.28)
RDW: 12.8 % (ref 11.7–15.4)
WBC: 12.3 10*3/uL — ABNORMAL HIGH (ref 3.4–10.8)

## 2023-01-03 LAB — CMP14+EGFR
ALT: 36 IU/L — ABNORMAL HIGH (ref 0–32)
AST: 32 IU/L (ref 0–40)
Albumin/Globulin Ratio: 1.1 — ABNORMAL LOW (ref 1.2–2.2)
Albumin: 4.3 g/dL (ref 3.9–4.9)
Alkaline Phosphatase: 86 IU/L (ref 44–121)
BUN/Creatinine Ratio: 15 (ref 9–23)
BUN: 11 mg/dL (ref 6–24)
Bilirubin Total: 0.5 mg/dL (ref 0.0–1.2)
CO2: 23 mmol/L (ref 20–29)
Calcium: 9.5 mg/dL (ref 8.7–10.2)
Chloride: 95 mmol/L — ABNORMAL LOW (ref 96–106)
Creatinine, Ser: 0.75 mg/dL (ref 0.57–1.00)
Globulin, Total: 3.9 g/dL (ref 1.5–4.5)
Glucose: 198 mg/dL — ABNORMAL HIGH (ref 70–99)
Potassium: 4 mmol/L (ref 3.5–5.2)
Sodium: 136 mmol/L (ref 134–144)
Total Protein: 8.2 g/dL (ref 6.0–8.5)
eGFR: 101 mL/min/{1.73_m2} (ref >59)

## 2023-01-03 LAB — HEMOGLOBIN A1C
Est. average glucose Bld gHb Est-mCnc: 186 mg/dL
Hgb A1c MFr Bld: 8.1 % — ABNORMAL HIGH (ref 4.8–5.6)

## 2023-01-03 LAB — LIPID PANEL
Chol/HDL Ratio: 4.5 ratio — ABNORMAL HIGH (ref 0.0–4.4)
Cholesterol, Total: 270 mg/dL — ABNORMAL HIGH (ref 100–199)
HDL: 60 mg/dL (ref >39)
LDL Chol Calc (NIH): 166 mg/dL — ABNORMAL HIGH (ref 0–99)
Triglycerides: 236 mg/dL — ABNORMAL HIGH (ref 0–149)
VLDL Cholesterol Cal: 44 mg/dL — ABNORMAL HIGH (ref 5–40)

## 2023-01-03 LAB — THYROID PANEL WITH TSH
Free Thyroxine Index: 2.6 (ref 1.2–4.9)
T3 Uptake Ratio: 24 % (ref 24–39)
T4, Total: 10.8 ug/dL (ref 4.5–12.0)
TSH: 2.09 u[IU]/mL (ref 0.450–4.500)

## 2023-01-03 LAB — HCV INTERPRETATION

## 2023-01-03 LAB — HCV AB W REFLEX TO QUANT PCR: HCV Ab: NONREACTIVE

## 2023-01-05 ENCOUNTER — Other Ambulatory Visit: Payer: Self-pay | Admitting: Nurse Practitioner

## 2023-01-05 DIAGNOSIS — E785 Hyperlipidemia, unspecified: Secondary | ICD-10-CM

## 2023-01-05 DIAGNOSIS — E1165 Type 2 diabetes mellitus with hyperglycemia: Secondary | ICD-10-CM

## 2023-01-05 MED ORDER — TRUEPLUS LANCETS 28G MISC
3 refills | Status: AC
  Filled 2023-01-05: qty 100, 50d supply, fill #0
  Filled 2023-04-03: qty 100, 30d supply, fill #1

## 2023-01-05 MED ORDER — TRUE METRIX METER W/DEVICE KIT
PACK | 0 refills | Status: AC
  Filled 2023-01-05: qty 1, 50d supply, fill #0

## 2023-01-05 MED ORDER — ATORVASTATIN CALCIUM 20 MG PO TABS
20.0000 mg | ORAL_TABLET | Freq: Every day | ORAL | 3 refills | Status: DC
  Filled 2023-01-05: qty 90, 90d supply, fill #0
  Filled 2023-03-13: qty 90, 90d supply, fill #1
  Filled 2023-04-03: qty 30, 30d supply, fill #1
  Filled 2023-08-25: qty 30, 30d supply, fill #2

## 2023-01-05 MED ORDER — METFORMIN HCL 500 MG PO TABS
1000.0000 mg | ORAL_TABLET | Freq: Two times a day (BID) | ORAL | 3 refills | Status: DC
  Filled 2023-01-05: qty 180, 45d supply, fill #0
  Filled 2023-03-13 – 2023-04-03 (×2): qty 120, 30d supply, fill #1

## 2023-01-05 MED ORDER — TRUE METRIX BLOOD GLUCOSE TEST VI STRP
ORAL_STRIP | 12 refills | Status: AC
  Filled 2023-01-05: qty 100, 50d supply, fill #0
  Filled 2023-04-03: qty 100, 50d supply, fill #1

## 2023-01-06 ENCOUNTER — Ambulatory Visit: Payer: Self-pay | Attending: Family Medicine

## 2023-01-06 ENCOUNTER — Other Ambulatory Visit: Payer: Self-pay

## 2023-01-15 ENCOUNTER — Other Ambulatory Visit: Payer: Self-pay

## 2023-01-15 DIAGNOSIS — Z1231 Encounter for screening mammogram for malignant neoplasm of breast: Secondary | ICD-10-CM

## 2023-01-17 ENCOUNTER — Telehealth: Payer: Self-pay | Admitting: Nurse Practitioner

## 2023-01-17 NOTE — Telephone Encounter (Signed)
Copied from [place] [id number]. Topic: Referral - Request for Referral >> Jan 17, 2023  1:51 PM Everette C wrote: Has patient seen PCP for this complaint? Yes.   *If NO, is insurance requiring patient see PCP for this issue before PCP can refer them? Referral for which specialty: Dentistry  Preferred provider/office: [facility]  Reason for referral: Dental Care

## 2023-01-20 ENCOUNTER — Other Ambulatory Visit: Payer: Self-pay | Admitting: Nurse Practitioner

## 2023-01-20 DIAGNOSIS — Z7689 Persons encountering health services in other specified circumstances: Secondary | ICD-10-CM

## 2023-01-20 NOTE — Telephone Encounter (Signed)
[facility] Card rep called to check status of dental referral request.

## 2023-01-20 NOTE — Telephone Encounter (Signed)
Referral has been placed. 

## 2023-01-21 ENCOUNTER — Other Ambulatory Visit: Payer: Self-pay

## 2023-01-21 NOTE — Telephone Encounter (Addendum)
Interpreter # [phone] ;Call placed to patient  to inform patient the dental referral has been made unable to reach message left on VM.

## 2023-01-24 ENCOUNTER — Ambulatory Visit
Admission: RE | Admit: 2023-01-24 | Discharge: 2023-01-24 | Disposition: A | Discharge status: Home / Self Care | Payer: Self-pay | Source: Ambulatory Visit | Attending: Nurse Practitioner | Admitting: Nurse Practitioner

## 2023-01-24 DIAGNOSIS — Z1231 Encounter for screening mammogram for malignant neoplasm of breast: Secondary | ICD-10-CM

## 2023-02-12 ENCOUNTER — Ambulatory Visit: Payer: Self-pay | Attending: Nurse Practitioner | Admitting: Nurse Practitioner

## 2023-02-12 ENCOUNTER — Other Ambulatory Visit: Payer: Self-pay

## 2023-02-12 ENCOUNTER — Encounter: Payer: Self-pay | Admitting: Nurse Practitioner

## 2023-02-12 VITALS — BP 119/79 | HR 91 | Ht 63.0 in | Wt 254.6 lb

## 2023-02-12 DIAGNOSIS — I10 Essential (primary) hypertension: Secondary | ICD-10-CM

## 2023-02-12 DIAGNOSIS — E1165 Type 2 diabetes mellitus with hyperglycemia: Secondary | ICD-10-CM

## 2023-02-12 MED ORDER — EMPAGLIFLOZIN 10 MG PO TABS
10.0000 mg | ORAL_TABLET | Freq: Every day | ORAL | 1 refills | Status: DC
  Filled 2023-02-12 – 2023-02-17 (×2): qty 90, 90d supply, fill #0
  Filled 2023-02-19: qty 30, 30d supply, fill #0
  Filled 2023-03-13 – 2023-04-03 (×2): qty 30, 30d supply, fill #1

## 2023-02-12 MED ORDER — AMOXICILLIN 500 MG PO CAPS
500.0000 mg | ORAL_CAPSULE | Freq: Three times a day (TID) | ORAL | 0 refills | Status: DC
  Filled 2023-02-12: qty 30, 10d supply, fill #0

## 2023-02-12 NOTE — Progress Notes (Signed)
Assessment & Plan:  Maria Daniel was seen today for hypertension.  Diagnoses and all orders for this visit:  Primary hypertension Continue all antihypertensives as prescribed.  Reminded to bring in blood pressure log for follow  up appointment.  RECOMMENDATIONS: DASH/Mediterranean Diets are healthier choices for HTN.    Type 2 diabetes mellitus with hyperglycemia, without long-term current use of insulin (HCC) -     empagliflozin (JARDIANCE) 10 MG TABS tablet; Take 1 tablet (10 mg total) by mouth daily before breakfast. Continue blood sugar control as discussed in office today, low carbohydrate diet, and regular physical exercise as tolerated, 150 minutes per week (30 min each day, 5 days per week, or 50 min 3 days per week). Keep blood sugar logs with fasting goal of 90-130 mg/dl, post prandial (after you eat) less than 180.  For Hypoglycemia: BS <60 and Hyperglycemia BS >400; contact the clinic ASAP. Annual eye exams and foot exams are recommended.    Patient has been counseled on age-appropriate routine health concerns for screening and prevention. These are reviewed and up-to-date. Referrals have been placed accordingly. Immunizations are up-to-date or declined.    Subjective:   Chief Complaint  Patient presents with   Hypertension   Hypertension Pertinent negatives include no blurred vision, chest pain, headaches, malaise/fatigue, palpitations or shortness of breath.   Maria Daniel 42 y.o. female presents to office today for follow up to HTN   VRI was used to communicate directly with patient for the entire encounter including providing detailed patient instructions.    HTN Blood pressure is well controlled. She is taking valsartan 40 mg daily as prescribed.  BP Readings from Last 3 Encounters:  02/12/23 119/79  12/31/22 (!) 144/95  09/20/20 (!) 151/91    DM 2 Not well controlled. She reports average am fasting readings: 150-160s fasting and postprandial 180-200s.   Will add jardiance today. She will continue on metformin 1000 mg BID.  Lab Results  Component Value Date   HGBA1C 8.1 (H) 12/31/2022    Review of Systems  Constitutional:  Negative for fever, malaise/fatigue and weight loss.  HENT: Negative.  Negative for nosebleeds.   Eyes: Negative.  Negative for blurred vision, double vision and photophobia.  Respiratory: Negative.  Negative for cough and shortness of breath.   Cardiovascular: Negative.  Negative for chest pain, palpitations and leg swelling.  Gastrointestinal: Negative.  Negative for heartburn, nausea and vomiting.  Musculoskeletal: Negative.  Negative for myalgias.  Neurological: Negative.  Negative for dizziness, focal weakness, seizures and headaches.  Psychiatric/Behavioral: Negative.  Negative for suicidal ideas.     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   Diabetes mellitus without complication ([place])    Dyspnea    Gestational diabetes    Hypertension    no meds patient denies   Hyperthyroidism    Insomnia    Knee pain    Miscarriage     Past Surgical History:  Procedure Laterality Date   CESAREAN SECTION     CESAREAN SECTION  08/17/2012   Procedure: CESAREAN SECTION;  Surgeon: Guss Bunde, MD;  Location: [facility];  Service: Obstetrics;  Laterality: N/A;   CESAREAN SECTION N/A 04/23/2016   Procedure: CESAREAN SECTION;  Surgeon: Mora Bellman, MD;  Location: [facility];  Service: Obstetrics;  Laterality: N/A;   CESAREAN SECTION N/A 07/28/2018   Procedure: REPEAT  CESAREAN SECTION;  Surgeon: Mora Bellman, MD;  Location: [facility];  Service: Obstetrics;  Laterality: N/A;   CESAREAN SECTION WITH BILATERAL TUBAL LIGATION Bilateral 07/28/2018   Procedure: BILATERAL TUBAL LIGATION;  Surgeon: Mora Bellman, MD;  Location: [facility];  Service: Obstetrics;  Laterality: Bilateral;    LESION REMOVAL N/A 11/11/2016   Procedure: EXCISION OF SUBCUTANEOUS LESION/ ABDOMEN;  Surgeon: Chancy Milroy, MD;  Location: [facility];  Service: Gynecology;  Laterality: N/A;    Family History  Problem Relation Age of Onset   Hypertension Mother    Kidney disease Father    Multiple sclerosis Father    Breast cancer Neg Hx     Social History Reviewed with no changes to be made today.   Outpatient Medications Prior to Visit  Medication Sig Dispense Refill   atorvastatin (LIPITOR) 20 MG tablet Take 1 tablet (20 mg total) by mouth daily. FOR CHOLESTEROL 90 tablet 3   Blood Glucose Monitoring Suppl (TRUE METRIX METER) w/Device KIT Use as instructed. Check blood glucose level by fingerstick twice per day. 1 kit 0   glucose blood (TRUE METRIX BLOOD GLUCOSE TEST) test strip Use as instructed. Check blood glucose level by fingerstick twice per day. 100 each 12   ibuprofen (ADVIL) 800 MG tablet Take 1 tablet (800 mg total) by mouth every 8 (eight) hours as needed. 60 tablet 3   metFORMIN (GLUCOPHAGE) 500 MG tablet Take 2 tablets (1,000 mg total) by mouth 2 (two) times daily with a meal. FOR DIABETES 180 tablet 3   TRUEplus Lancets 28G MISC Use as instructed. Check blood glucose level by fingerstick twice per day. 100 each 3   valsartan (DIOVAN) 40 MG tablet Take 1 tablet (40 mg total) by mouth daily. 90 tablet 3   No facility-administered medications prior to visit.    No Known Allergies     Objective:    BP 119/79   Pulse 91   Ht 5\' 3"  (1.6 m)   Wt 254 lb 9.6 oz (115.5 kg)   LMP 02/10/2023 (Exact Date) Comment: started  SpO2 96%   BMI 45.10 kg/m  Wt Readings from Last 3 Encounters:  02/12/23 254 lb 9.6 oz (115.5 kg)  12/31/22 256 lb 6.4 oz (116.3 kg)  09/16/20 259 lb (117.5 kg)    Physical Exam Vitals and nursing note reviewed.  Constitutional:      Appearance: She is well-developed.  HENT:     Head: Normocephalic and atraumatic.  Cardiovascular:     Rate and Rhythm:  Normal rate and regular rhythm.     Heart sounds: Normal heart sounds. No murmur heard.    No friction rub. No gallop.  Pulmonary:     Effort: Pulmonary effort is normal. No tachypnea or respiratory distress.     Breath sounds: Normal breath sounds. No decreased breath sounds, wheezing, rhonchi or rales.  Chest:     Chest wall: No tenderness.  Abdominal:     General: Bowel sounds are normal.     Palpations: Abdomen is soft.  Musculoskeletal:        General: Normal range of motion.     Cervical back: Normal range of motion.  Skin:    General: Skin is warm and dry.  Neurological:     Mental Status: She is alert and oriented to person, place, and time.     Coordination: Coordination normal.  Psychiatric:        Behavior: Behavior normal. Behavior is cooperative.  Thought Content: Thought content normal.        Judgment: Judgment normal.          Patient has been counseled extensively about nutrition and exercise as well as the importance of adherence with medications and regular follow-up. The patient was given clear instructions to go to ER or return to medical center if symptoms don't improve, worsen or new problems develop. The patient verbalized understanding.   Follow-up: Return in about 2 months (around 04/14/2023) for [place], FNP-BC [facility], [place]   02/12/2023, 4:34 PM

## 2023-02-12 NOTE — Progress Notes (Signed)
Patient feels that Metformin is not working for her. Fasting glucose are with the 150's-160's and non fasting glucose are within the 180's and 200's.

## 2023-02-13 ENCOUNTER — Other Ambulatory Visit: Payer: Self-pay

## 2023-02-14 ENCOUNTER — Other Ambulatory Visit: Payer: Self-pay

## 2023-02-17 ENCOUNTER — Other Ambulatory Visit: Payer: Self-pay

## 2023-02-19 ENCOUNTER — Other Ambulatory Visit: Payer: Self-pay

## 2023-02-24 ENCOUNTER — Other Ambulatory Visit: Payer: Self-pay

## 2023-02-28 ENCOUNTER — Other Ambulatory Visit: Payer: Self-pay

## 2023-03-03 ENCOUNTER — Other Ambulatory Visit: Payer: Self-pay

## 2023-03-13 ENCOUNTER — Other Ambulatory Visit (HOSPITAL_COMMUNITY): Payer: Self-pay

## 2023-03-20 ENCOUNTER — Other Ambulatory Visit (HOSPITAL_COMMUNITY): Payer: Self-pay

## 2023-03-20 ENCOUNTER — Other Ambulatory Visit: Payer: Self-pay

## 2023-03-21 ENCOUNTER — Other Ambulatory Visit (HOSPITAL_COMMUNITY): Payer: Self-pay

## 2023-03-24 ENCOUNTER — Other Ambulatory Visit: Payer: Self-pay

## 2023-03-24 ENCOUNTER — Other Ambulatory Visit (HOSPITAL_COMMUNITY): Payer: Self-pay

## 2023-03-25 ENCOUNTER — Encounter: Payer: Self-pay | Admitting: Pharmacist

## 2023-03-25 ENCOUNTER — Other Ambulatory Visit: Payer: Self-pay

## 2023-03-31 ENCOUNTER — Other Ambulatory Visit: Payer: Self-pay

## 2023-04-03 ENCOUNTER — Other Ambulatory Visit: Payer: Self-pay

## 2023-04-14 ENCOUNTER — Ambulatory Visit: Payer: Self-pay | Attending: Nurse Practitioner | Admitting: Nurse Practitioner

## 2023-04-16 ENCOUNTER — Other Ambulatory Visit: Payer: Self-pay

## 2023-04-16 MED ORDER — AMOXICILLIN 500 MG PO CAPS
500.0000 mg | ORAL_CAPSULE | Freq: Four times a day (QID) | ORAL | 0 refills | Status: DC
  Filled 2023-04-16: qty 25, 7d supply, fill #0

## 2023-04-16 MED ORDER — TRAMADOL HCL 50 MG PO TABS
50.0000 mg | ORAL_TABLET | Freq: Four times a day (QID) | ORAL | 0 refills | Status: DC | PRN
  Filled 2023-04-16: qty 10, 3d supply, fill #0

## 2023-04-22 ENCOUNTER — Other Ambulatory Visit: Payer: Self-pay

## 2023-04-23 ENCOUNTER — Other Ambulatory Visit: Payer: Self-pay

## 2023-04-24 ENCOUNTER — Other Ambulatory Visit: Payer: Self-pay

## 2023-05-19 ENCOUNTER — Other Ambulatory Visit: Payer: Self-pay

## 2023-06-07 ENCOUNTER — Other Ambulatory Visit: Payer: Self-pay

## 2023-06-13 ENCOUNTER — Other Ambulatory Visit: Payer: Self-pay

## 2023-06-27 ENCOUNTER — Ambulatory Visit: Payer: Self-pay | Admitting: Nurse Practitioner

## 2023-08-25 ENCOUNTER — Other Ambulatory Visit: Payer: Self-pay

## 2023-08-25 ENCOUNTER — Ambulatory Visit: Payer: Self-pay | Admitting: Nurse Practitioner

## 2023-08-26 ENCOUNTER — Other Ambulatory Visit: Payer: Self-pay

## 2023-09-02 ENCOUNTER — Other Ambulatory Visit: Payer: Self-pay

## 2023-09-02 ENCOUNTER — Ambulatory Visit: Payer: Self-pay | Attending: Nurse Practitioner | Admitting: Nurse Practitioner

## 2023-09-02 ENCOUNTER — Encounter: Payer: Self-pay | Admitting: Nurse Practitioner

## 2023-09-02 VITALS — BP 122/81 | HR 78 | Ht 63.0 in | Wt 253.6 lb

## 2023-09-02 DIAGNOSIS — E1165 Type 2 diabetes mellitus with hyperglycemia: Secondary | ICD-10-CM

## 2023-09-02 DIAGNOSIS — Z7984 Long term (current) use of oral hypoglycemic drugs: Secondary | ICD-10-CM

## 2023-09-02 DIAGNOSIS — D72829 Elevated white blood cell count, unspecified: Secondary | ICD-10-CM

## 2023-09-02 DIAGNOSIS — Z7985 Long-term (current) use of injectable non-insulin antidiabetic drugs: Secondary | ICD-10-CM

## 2023-09-02 DIAGNOSIS — I10 Essential (primary) hypertension: Secondary | ICD-10-CM

## 2023-09-02 DIAGNOSIS — Z23 Encounter for immunization: Secondary | ICD-10-CM

## 2023-09-02 LAB — POCT GLYCOSYLATED HEMOGLOBIN (HGB A1C): Hemoglobin A1C: 8.6 % — AB (ref 4.0–5.6)

## 2023-09-02 MED ORDER — EMPAGLIFLOZIN 10 MG PO TABS
10.0000 mg | ORAL_TABLET | Freq: Every day | ORAL | 1 refills | Status: DC
  Filled 2023-09-02 – 2023-12-09 (×2): qty 90, 90d supply, fill #0

## 2023-09-02 MED ORDER — OZEMPIC (0.25 OR 0.5 MG/DOSE) 2 MG/3ML ~~LOC~~ SOPN
0.2500 mg | PEN_INJECTOR | SUBCUTANEOUS | 1 refills | Status: DC
Start: 2023-09-02 — End: 2023-11-13
  Filled 2023-09-02: qty 3, 28d supply, fill #0
  Filled 2023-09-29 (×3): qty 3, 28d supply, fill #1

## 2023-09-02 MED ORDER — FREESTYLE LIBRE 3 READER DEVI
0 refills | Status: DC
  Filled 2023-09-02: qty 1, 30d supply, fill #0

## 2023-09-02 MED ORDER — FREESTYLE LIBRE 3 SENSOR MISC
6 refills | Status: DC
Start: 2023-09-02 — End: 2023-12-10
  Filled 2023-09-02: qty 2, 28d supply, fill #0

## 2023-09-02 NOTE — Progress Notes (Signed)
Assessment & Plan:  Maria Daniel was seen today for medical management of chronic issues.  Diagnoses and all orders for this visit:  Type 2 diabetes mellitus with hyperglycemia, without long-term current use of insulin START OZEMPIC TODAY  Continue all other medications as prescribed.  -     POCT glycosylated hemoglobin (Hb A1C) -     empagliflozin (JARDIANCE) 10 MG TABS tablet; Take 1 tablet (10 mg total) by mouth daily before breakfast. -     Semaglutide,0.25 or 0.5MG /DOS, (OZEMPIC, 0.25 OR 0.5 MG/DOSE,) 2 MG/3ML SOPN; Inject 0.25 mg into the skin once a week. -     Continuous Glucose Sensor (FREESTYLE LIBRE 3 SENSOR) MISC; Place 1 sensor on the skin every 14 days. Use to check glucose three times per day -     Continuous Glucose Receiver (FREESTYLE LIBRE 3 READER) DEVI; Use as instructed. Check blood glucose level 3 times per day -     CMP14+EGFR  Leukocytosis, unspecified type -     CBC with Differential  Encounter for immunization -     Flu vaccine trivalent PF, 6mos and older(Flulaval,Afluria,Fluarix,Fluzone)  Primary hypertension Continue valsartan as prescribed.  Reminded to bring in blood pressure log for follow  up appointment.  RECOMMENDATIONS: DASH/Mediterranean Diets are healthier choices for HTN.     Patient has been counseled on age-appropriate routine health concerns for screening and prevention. These are reviewed and up-to-date. Referrals have been placed accordingly. Immunizations are up-to-date or declined.    Subjective:   Chief Complaint  Patient presents with   Medical Management of Chronic Issues   HPI Maria Daniel 42 y.o. female presents to office today for follow up to DM and HTN  VRI was used to communicate directly with patient for the entire encounter including providing detailed patient instructions.    She has a past medical history of Diabetes mellitus without complication (HCC), Dyspnea, Gestational diabetes, Hypertension, Hyperthyroidism,  Insomnia, Knee pain, and Miscarriage.     DM 2  A1c increasing and DM is not well controlled. She is currently taking Jardiance 10 mg daily and metformin 1000 mg BID.  Lab Results  Component Value Date   HGBA1C 8.6 (A) 09/02/2023    Lab Results  Component Value Date   HGBA1C 8.1 (H) 12/31/2022     HTN Blood pressure is well controlled. She is taking valsartan 40 mg daily as prescribed.  BP Readings from Last 3 Encounters:  09/02/23 122/81  02/12/23 119/79  12/31/22 (!) 144/95    She endorses bilateral leg weakness. Not associated with any sciatica symptoms. Onset 6 months ago.  Unrelated to any falls or injury.  Falls when she walks too fast. Denies any dizziness or lightheadedness. She also has complaints of chronic low back pain which is worse with bending or lifting. BMI 45 Review of Systems  Constitutional:  Negative for fever, malaise/fatigue and weight loss.  HENT: Negative.  Negative for nosebleeds.   Eyes: Negative.  Negative for blurred vision, double vision and photophobia.  Respiratory: Negative.  Negative for cough and shortness of breath.   Cardiovascular: Negative.  Negative for chest pain, palpitations and leg swelling.  Gastrointestinal: Negative.  Negative for heartburn, nausea and vomiting.  Musculoskeletal: Negative.  Negative for myalgias.  Neurological: Negative.  Negative for dizziness, focal weakness, seizures and headaches.  Psychiatric/Behavioral: Negative.  Negative for suicidal ideas.     Past Medical History:  Diagnosis Date   Complication of anesthesia    had trouble with spinal not working  with last cs does not want a learner to do spinal   Diabetes mellitus without complication (HCC)    Dyspnea    Gestational diabetes    Hypertension    no meds patient denies   Hyperthyroidism    Insomnia    Knee pain    Miscarriage     Past Surgical History:  Procedure Laterality Date   CESAREAN SECTION     CESAREAN SECTION  08/17/2012   Procedure:  CESAREAN SECTION;  Surgeon: Lesly Dukes, MD;  Location: [facility];  Service: Obstetrics;  Laterality: N/A;   CESAREAN SECTION N/A 04/23/2016   Procedure: CESAREAN SECTION;  Surgeon: Catalina Antigua, MD;  [facility];  Service: Obstetrics;  Laterality: N/A;   CESAREAN SECTION N/A 07/28/2018   Procedure: REPEAT CESAREAN SECTION;  Surgeon: Catalina Antigua, MD;  [facility];  Service: Obstetrics;  Laterality: N/A;   CESAREAN SECTION WITH BILATERAL TUBAL LIGATION Bilateral 07/28/2018   Procedure: BILATERAL TUBAL LIGATION;  Surgeon: Catalina Antigua, MD;  [facility];  Service: Obstetrics;  Laterality: Bilateral;   LESION REMOVAL N/A 11/11/2016   Procedure: EXCISION OF SUBCUTANEOUS LESION/ ABDOMEN;  Surgeon: Hermina Staggers, MD;  Location: [facility];  Service: Gynecology;  Laterality: N/A;    Family History  Problem Relation Age of Onset   Hypertension Mother    Kidney disease Father    Multiple sclerosis Father    Breast cancer Neg Hx     Social History Reviewed with no changes to be made today.   Outpatient Medications Prior to Visit  Medication Sig Dispense Refill   atorvastatin (LIPITOR) 20 MG tablet Take 1 tablet (20 mg total) by mouth daily. FOR CHOLESTEROL 90 tablet 3   Blood Glucose Monitoring Suppl (TRUE METRIX METER) w/Device KIT Use as instructed. Check blood glucose level by fingerstick twice per day. 1 kit 0   glucose blood (TRUE METRIX BLOOD GLUCOSE TEST) test strip Use as instructed. Check blood glucose level by fingerstick twice per day. 100 each 12   metFORMIN (GLUCOPHAGE) 500 MG tablet Take 2 tablets (1,000 mg total) by mouth 2 (two) times daily with a meal. FOR DIABETES 180 tablet 3   TRUEplus Lancets 28G MISC Use as instructed. Check blood glucose level by fingerstick twice per day. 100 each 3   valsartan (DIOVAN) 40 MG tablet Take 1 tablet (40 mg total) by mouth daily. 90 tablet 3   empagliflozin (JARDIANCE) 10 MG TABS tablet Take  1 tablet (10 mg total) by mouth daily before breakfast. 90 tablet 1   amoxicillin (AMOXIL) 500 MG capsule Take 2 capsules (1,000mg )  stat (NOW) then Take 1 capsule (500 mg total) by mouth every 6 (six) hours until gone. (Patient not taking: Reported on 09/02/2023) 25 capsule 0   ibuprofen (ADVIL) 800 MG tablet Take 1 tablet (800 mg total) by mouth every 8 (eight) hours as needed. (Patient not taking: Reported on 09/02/2023) 60 tablet 3   traMADol (ULTRAM) 50 MG tablet Take 1 tablet (50 mg total) by mouth every 6 (six) hours as needed for pain. (Patient not taking: Reported on 09/02/2023) 10 tablet 0   No facility-administered medications prior to visit.    No Known Allergies     Objective:    BP 122/81   Pulse 78   Ht 5\' 3"  (1.6 m)   Wt 253 lb 9.6 oz (115 kg)   LMP 08/03/2023 (Approximate)   SpO2 96%   Breastfeeding No   BMI 44.92  kg/m  Wt Readings from Last 3 Encounters:  09/02/23 253 lb 9.6 oz (115 kg)  02/12/23 254 lb 9.6 oz (115.5 kg)  12/31/22 256 lb 6.4 oz (116.3 kg)    Physical Exam Vitals and nursing note reviewed.  Constitutional:      Appearance: She is well-developed.  HENT:     Head: Normocephalic and atraumatic.  Cardiovascular:     Rate and Rhythm: Normal rate and regular rhythm.     Heart sounds: Normal heart sounds. No murmur heard.    No friction rub. No gallop.  Pulmonary:     Effort: Pulmonary effort is normal. No tachypnea or respiratory distress.     Breath sounds: Normal breath sounds. No decreased breath sounds, wheezing, rhonchi or rales.  Chest:     Chest wall: No tenderness.  Abdominal:     General: Bowel sounds are normal.     Palpations: Abdomen is soft.  Musculoskeletal:        General: Normal range of motion.     Cervical back: Normal range of motion.  Skin:    General: Skin is warm and dry.  Neurological:     Mental Status: She is alert and oriented to person, place, and time.     Coordination: Coordination normal.  Psychiatric:         Behavior: Behavior normal. Behavior is cooperative.        Thought Content: Thought content normal.        Judgment: Judgment normal.          Patient has been counseled extensively about nutrition and exercise as well as the importance of adherence with medications and regular follow-up. The patient was given clear instructions to go to ER or return to medical center if symptoms don't improve, worsen or new problems develop. The patient verbalized understanding.   Follow-up: Return in about 3 months (around 12/03/2023).   Claiborne Rigg, FNP-BC [facility] [place], [place] [phone]   09/02/2023, 8:29 PM

## 2023-09-03 LAB — CBC WITH DIFFERENTIAL/PLATELET
Basophils Absolute: 0.1 10*3/uL (ref 0.0–0.2)
Basos: 1 %
EOS (ABSOLUTE): 0.2 10*3/uL (ref 0.0–0.4)
Eos: 2 %
Hematocrit: 43.9 % (ref 34.0–46.6)
Hemoglobin: 14.1 g/dL (ref 11.1–15.9)
Immature Grans (Abs): 0 10*3/uL (ref 0.0–0.1)
Immature Granulocytes: 0 %
Lymphocytes Absolute: 3.5 10*3/uL — ABNORMAL HIGH (ref 0.7–3.1)
Lymphs: 38 %
MCH: 26.3 pg — ABNORMAL LOW (ref 26.6–33.0)
MCHC: 32.1 g/dL (ref 31.5–35.7)
MCV: 82 fL (ref 79–97)
Monocytes Absolute: 0.6 10*3/uL (ref 0.1–0.9)
Monocytes: 7 %
Neutrophils Absolute: 4.8 10*3/uL (ref 1.4–7.0)
Neutrophils: 52 %
Platelets: 403 10*3/uL (ref 150–450)
RBC: 5.36 x10E6/uL — ABNORMAL HIGH (ref 3.77–5.28)
RDW: 13.1 % (ref 11.7–15.4)
WBC: 9.2 10*3/uL (ref 3.4–10.8)

## 2023-09-03 LAB — CMP14+EGFR
ALT: 43 [IU]/L — ABNORMAL HIGH (ref 0–32)
AST: 40 [IU]/L (ref 0–40)
Albumin: 4.4 g/dL (ref 3.9–4.9)
Alkaline Phosphatase: 92 [IU]/L (ref 44–121)
BUN/Creatinine Ratio: 15 (ref 9–23)
BUN: 11 mg/dL (ref 6–24)
Bilirubin Total: 0.4 mg/dL (ref 0.0–1.2)
CO2: 24 mmol/L (ref 20–29)
Calcium: 9.5 mg/dL (ref 8.7–10.2)
Chloride: 99 mmol/L (ref 96–106)
Creatinine, Ser: 0.72 mg/dL (ref 0.57–1.00)
Globulin, Total: 3.7 g/dL (ref 1.5–4.5)
Glucose: 244 mg/dL — ABNORMAL HIGH (ref 70–99)
Potassium: 4.6 mmol/L (ref 3.5–5.2)
Sodium: 138 mmol/L (ref 134–144)
Total Protein: 8.1 g/dL (ref 6.0–8.5)
eGFR: 106 mL/min/{1.73_m2} (ref >59)

## 2023-09-29 ENCOUNTER — Other Ambulatory Visit: Payer: Self-pay

## 2023-10-03 ENCOUNTER — Other Ambulatory Visit: Payer: Self-pay

## 2023-10-10 ENCOUNTER — Other Ambulatory Visit: Payer: Self-pay

## 2023-10-13 ENCOUNTER — Other Ambulatory Visit: Payer: Self-pay

## 2023-10-16 ENCOUNTER — Other Ambulatory Visit: Payer: Self-pay

## 2023-10-17 ENCOUNTER — Other Ambulatory Visit: Payer: Self-pay

## 2023-11-13 ENCOUNTER — Other Ambulatory Visit: Payer: Self-pay

## 2023-11-13 ENCOUNTER — Other Ambulatory Visit: Payer: Self-pay | Admitting: Nurse Practitioner

## 2023-11-13 DIAGNOSIS — E1165 Type 2 diabetes mellitus with hyperglycemia: Secondary | ICD-10-CM

## 2023-11-13 MED ORDER — OZEMPIC (0.25 OR 0.5 MG/DOSE) 2 MG/3ML ~~LOC~~ SOPN
0.2500 mg | PEN_INJECTOR | SUBCUTANEOUS | 1 refills | Status: DC
Start: 2023-11-13 — End: 2023-12-09
  Filled 2023-11-13: qty 3, 28d supply, fill #0
  Filled 2023-12-09: qty 3, 56d supply, fill #1

## 2023-11-13 NOTE — Telephone Encounter (Signed)
Requested Prescriptions  Pending Prescriptions Disp Refills   Semaglutide,0.25 or 0.5MG /DOS, (OZEMPIC, 0.25 OR 0.5 MG/DOSE,) 2 MG/3ML SOPN 3 mL 1    Sig: Inject 0.25 mg into the skin once a week.     Endocrinology:  Diabetes - GLP-1 Receptor Agonists - semaglutide Failed - 11/13/2023  9:07 AM      Failed - HBA1C in normal range and within 180 days    Hemoglobin A1C  Date Value Ref Range Status  09/02/2023 8.6 (A) 4.0 - 5.6 % Final   Hgb A1c MFr Bld  Date Value Ref Range Status  12/31/2022 8.1 (H) 4.8 - 5.6 % Final    Comment:             Prediabetes: 5.7 - 6.4          Diabetes: >6.4          Glycemic control for adults with diabetes: <7.0          Passed - Cr in normal range and within 360 days    Creat  Date Value Ref Range Status  11/21/2016 0.35 (L) 0.50 - 1.10 mg/dL Final   Creatinine, Ser  Date Value Ref Range Status  09/02/2023 0.72 0.57 - 1.00 mg/dL Final   Creatinine, Urine  Date Value Ref Range Status  04/24/2016 187.00 mg/dL Final         Passed - Valid encounter within last 6 months    Recent Outpatient Visits           2 months ago Type 2 diabetes mellitus with hyperglycemia, without long-term current use of insulin (HCC)   [facility] [place]. [facility] Claiborne Rigg, NP   9 months ago Primary hypertension   [facility] Of [place]. [facility] Claiborne Rigg, NP   10 months ago Primary hypertension   [facility] Of [place]. [facility] Claiborne Rigg, NP   5 years ago Essential hypertension   [facility] - A Dept Of [place]. [facility] [place], [place] Reeves R, FNP   6 years ago Essential hypertension   [facility] - A Dept Of [place]. [facility] Arrie Senate R, FNP       Future Appointments             In 3 weeks Claiborne Rigg, NP [facility] - A Dept Of [place] Bridegroom. [facility]

## 2023-12-03 ENCOUNTER — Other Ambulatory Visit: Payer: Self-pay

## 2023-12-09 ENCOUNTER — Ambulatory Visit: Payer: Self-pay | Attending: Nurse Practitioner | Admitting: Nurse Practitioner

## 2023-12-09 ENCOUNTER — Other Ambulatory Visit: Payer: Self-pay

## 2023-12-09 VITALS — BP 115/76 | HR 74 | Ht 63.0 in | Wt 250.8 lb

## 2023-12-09 DIAGNOSIS — Z7984 Long term (current) use of oral hypoglycemic drugs: Secondary | ICD-10-CM

## 2023-12-09 DIAGNOSIS — E66813 Obesity, class 3: Secondary | ICD-10-CM

## 2023-12-09 DIAGNOSIS — E7800 Pure hypercholesterolemia, unspecified: Secondary | ICD-10-CM

## 2023-12-09 DIAGNOSIS — E1165 Type 2 diabetes mellitus with hyperglycemia: Secondary | ICD-10-CM

## 2023-12-09 DIAGNOSIS — Z7985 Long-term (current) use of injectable non-insulin antidiabetic drugs: Secondary | ICD-10-CM

## 2023-12-09 DIAGNOSIS — E669 Obesity, unspecified: Secondary | ICD-10-CM

## 2023-12-09 DIAGNOSIS — I10 Essential (primary) hypertension: Secondary | ICD-10-CM

## 2023-12-09 DIAGNOSIS — Z6841 Body Mass Index (BMI) 40.0 and over, adult: Secondary | ICD-10-CM

## 2023-12-09 LAB — POCT GLYCOSYLATED HEMOGLOBIN (HGB A1C): HbA1c, POC (controlled diabetic range): 6.7 % (ref 0.0–7.0)

## 2023-12-09 MED ORDER — SEMAGLUTIDE (1 MG/DOSE) 4 MG/3ML ~~LOC~~ SOPN
1.0000 mg | PEN_INJECTOR | SUBCUTANEOUS | 1 refills | Status: DC
  Filled 2023-12-09: qty 3, 28d supply, fill #0
  Filled 2024-01-01 (×2): qty 3, 28d supply, fill #1
  Filled 2024-01-27: qty 3, 28d supply, fill #2
  Filled 2024-03-09: qty 3, 28d supply, fill #3

## 2023-12-09 MED ORDER — OZEMPIC (0.25 OR 0.5 MG/DOSE) 2 MG/3ML ~~LOC~~ SOPN: 0.2500 mg | PEN_INJECTOR | SUBCUTANEOUS | 1 refills | Status: DC

## 2023-12-09 MED ORDER — VALSARTAN 40 MG PO TABS
40.0000 mg | ORAL_TABLET | Freq: Every day | ORAL | 3 refills | Status: AC
  Filled 2023-12-09 – 2024-03-09 (×2): qty 90, 90d supply, fill #0
  Filled 2024-06-11: qty 90, 90d supply, fill #1
  Filled 2024-09-10: qty 90, 90d supply, fill #2

## 2023-12-09 MED ORDER — OZEMPIC (0.25 OR 0.5 MG/DOSE) 2 MG/3ML ~~LOC~~ SOPN
0.5000 mg | PEN_INJECTOR | SUBCUTANEOUS | 1 refills | Status: DC
  Filled 2023-12-09: qty 3, fill #0

## 2023-12-09 NOTE — Progress Notes (Signed)
 Assessment & Plan:  Myles was seen today for diabetes.  Diagnoses and all orders for this visit:  Type 2 diabetes mellitus with hyperglycemia, without long-term current use of insulin  Ozempic  increased to 1 mg weekly.  Continue all medications as prescribed -     POCT glycosylated hemoglobin (Hb A1C) -     Semaglutide , 1 MG/DOSE, 4 MG/3ML SOPN; Inject 1 mg as directed once a week.  Primary hypertension Continue all antihypertensives as prescribed.  Reminded to bring in blood pressure log for follow  up appointment.  RECOMMENDATIONS: DASH/Mediterranean Diets are healthier choices for HTN.   -     valsartan  (DIOVAN ) 40 MG tablet; Take 1 tablet (40 mg total) by mouth daily. -     CMP14+EGFR  Pure hypercholesterolemia INSTRUCTIONS: Work on a low fat, heart healthy diet and participate in regular aerobic exercise program by working out at least 150 minutes per week; 5 days a week-30 minutes per day. Avoid red meat/beef/steak,  fried foods. junk foods, sodas, sugary drinks, unhealthy snacking, alcohol and smoking.  Drink at least 80 oz of water per day and monitor your carbohydrate intake daily.   -     Lipid panel    Patient has been counseled on age-appropriate routine health concerns for screening and prevention. These are reviewed and up-to-date. Referrals have been placed accordingly. Immunizations are up-to-date or declined.    Subjective:   Chief Complaint  Patient presents with   Diabetes    Maria Daniel 43 y.o. female presents to office today for follow up to DM   VRI was used to communicate directly with patient for the entire encounter including providing detailed patient instructions.     She has a past medical history of Complication of anesthesia, Diabetes mellitus without complication (HCC), Dyspnea, Gestational diabetes, Hypertension, Hyperthyroidism, Insomnia, Knee pain, and Miscarriage.    DM Diabetes is improving.  She is currently taking metformin   1000 mg daily and administering Ozempic  0.5 mg weekly.  She is requesting to increase Ozempic  at this time.  Weight is gradually trending down as well.  She is not taking jardiance  at this time.  Lab Results  Component Value Date   HGBA1C 6.7 12/09/2023    Lab Results  Component Value Date   HGBA1C 8.6 (A) 09/02/2023  LDL not at goal with atorvastatin  20 mg daily  Lab Results  Component Value Date   LDLCALC 135 (H) 12/09/2023    HTN Blood pressure is well-controlled and at goal with valsartan  40 mg daily. BP Readings from Last 3 Encounters:  12/09/23 115/76  09/02/23 122/81  02/12/23 119/79     She is requesting a dental referral today. No current dental pain but does have poor dentition   Review of Systems  Constitutional:  Negative for fever, malaise/fatigue and weight loss.  HENT: Negative.  Negative for nosebleeds.   Eyes: Negative.  Negative for blurred vision, double vision and photophobia.  Respiratory: Negative.  Negative for cough and shortness of breath.   Cardiovascular: Negative.  Negative for chest pain, palpitations and leg swelling.  Gastrointestinal: Negative.  Negative for heartburn, nausea and vomiting.  Musculoskeletal: Negative.  Negative for myalgias.  Neurological: Negative.  Negative for dizziness, focal weakness, seizures and headaches.  Psychiatric/Behavioral: Negative.  Negative for suicidal ideas.     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  Diabetes mellitus without complication (HCC)    Dyspnea    Gestational diabetes    Hypertension    no meds patient denies   Hyperthyroidism    Insomnia    Knee pain    Miscarriage     Past Surgical History:  Procedure Laterality Date   CESAREAN SECTION     CESAREAN SECTION  08/17/2012   Procedure: CESAREAN SECTION;  Surgeon: Burnard VEAR Pate, MD;  [facility]: [facility];  Service:  Obstetrics;  Laterality: N/A;   CESAREAN SECTION N/A 04/23/2016   Procedure: CESAREAN SECTION;  Surgeon: Winton Felt, MD;  [facility];  Service: Obstetrics;  Laterality: N/A;   CESAREAN SECTION N/A 07/28/2018   Procedure: REPEAT CESAREAN SECTION;  Surgeon: Felt Winton, MD;  [facility];  Service: Obstetrics;  Laterality: N/A;   CESAREAN SECTION WITH BILATERAL TUBAL LIGATION Bilateral 07/28/2018   Procedure: BILATERAL TUBAL LIGATION;  Surgeon: Felt Winton, MD;  [facility];  Service: Obstetrics;  Laterality: Bilateral;   LESION REMOVAL N/A 11/11/2016   Procedure: EXCISION OF SUBCUTANEOUS LESION/ ABDOMEN;  Surgeon: Ozell LITTIE Cowman, MD;  Location: [facility];  Service: Gynecology;  Laterality: N/A;    Family History  Problem Relation Age of Onset   Hypertension Mother    Kidney disease Father    Multiple sclerosis Father    Breast cancer Neg Hx     Social History Reviewed with no changes to be made today.   Outpatient Medications Prior to Visit  Medication Sig Dispense Refill   atorvastatin  (LIPITOR) 20 MG tablet Take 1 tablet (20 mg total) by mouth daily. FOR CHOLESTEROL 90 tablet 3   Blood Glucose Monitoring Suppl (TRUE METRIX METER) w/Device KIT Use as instructed. Check blood glucose level by fingerstick twice per day. 1 kit 0   empagliflozin  (JARDIANCE ) 10 MG TABS tablet Take 1 tablet (10 mg total) by mouth daily before breakfast. 90 tablet 1   glucose blood (TRUE METRIX BLOOD GLUCOSE TEST) test strip Use as instructed. Check blood glucose level by fingerstick twice per day. 100 each 12   metFORMIN  (GLUCOPHAGE ) 500 MG tablet Take 2 tablets (1,000 mg total) by mouth 2 (two) times daily with a meal. FOR DIABETES 180 tablet 3   TRUEplus Lancets 28G MISC Use as instructed. Check blood glucose level by fingerstick twice per day. 100 each 3   Semaglutide ,0.25 or 0.5MG /DOS, (OZEMPIC , 0.25 OR 0.5 MG/DOSE,) 2 MG/3ML SOPN Inject 0.25 mg into  the skin once a week. 3 mL 1   valsartan  (DIOVAN ) 40 MG tablet Take 1 tablet (40 mg total) by mouth daily. 90 tablet 3   Continuous Glucose Receiver (FREESTYLE LIBRE 3 READER) DEVI Use as instructed. Check blood glucose level 3 times per day (Patient not taking: Reported on 12/09/2023) 1 each 0   Continuous Glucose Sensor (FREESTYLE LIBRE 3 SENSOR) MISC Place 1 sensor on the skin every 14 days. Use to check glucose three times per day (Patient not taking: Reported on 12/09/2023) 2 each 6   No facility-administered medications prior to visit.    No Known Allergies     Objective:    BP 115/76 (BP Location: Left Arm, Patient Position: Sitting, Cuff Size: Normal)   Pulse 74   Ht 5' 3 (1.6 m)   Wt 250 lb 12.8 oz (113.8 kg)   LMP 12/06/2023 (Exact Date)   SpO2 100%   BMI 44.43 kg/m  Wt Readings from Last 3 Encounters:  12/09/23 250 lb 12.8 oz (113.8  kg)  09/02/23 253 lb 9.6 oz (115 kg)  02/12/23 254 lb 9.6 oz (115.5 kg)    Physical Exam Vitals and nursing note reviewed.  Constitutional:      Appearance: She is well-developed.  HENT:     Head: Normocephalic and atraumatic.     Mouth/Throat:     Dentition: Abnormal dentition.  Cardiovascular:     Rate and Rhythm: Normal rate and regular rhythm.     Heart sounds: Normal heart sounds. No murmur heard.    No friction rub. No gallop.  Pulmonary:     Effort: Pulmonary effort is normal. No tachypnea or respiratory distress.     Breath sounds: Normal breath sounds. No decreased breath sounds, wheezing, rhonchi or rales.  Chest:     Chest wall: No tenderness.  Abdominal:     General: Bowel sounds are normal.     Palpations: Abdomen is soft.  Musculoskeletal:        General: Normal range of motion.     Cervical back: Normal range of motion.  Skin:    General: Skin is warm and dry.  Neurological:     Mental Status: She is alert and oriented to person, place, and time.     Coordination: Coordination normal.  Psychiatric:         Behavior: Behavior normal. Behavior is cooperative.        Thought Content: Thought content normal.        Judgment: Judgment normal.          Patient has been counseled extensively about nutrition and exercise as well as the importance of adherence with medications and regular follow-up. The patient was given clear instructions to go to ER or return to medical center if symptoms don't improve, worsen or new problems develop. The patient verbalized understanding.   Follow-up: No follow-ups on file.   Haze LELON Servant, FNP-BC [facility] [place], [place] [phone]   12/09/2023, 4:51 PM

## 2023-12-10 ENCOUNTER — Other Ambulatory Visit: Payer: Self-pay

## 2023-12-10 LAB — CMP14+EGFR
ALT: 27 [IU]/L (ref 0–32)
AST: 32 [IU]/L (ref 0–40)
Albumin: 4.3 g/dL (ref 3.9–4.9)
Alkaline Phosphatase: 82 [IU]/L (ref 44–121)
BUN/Creatinine Ratio: 13 (ref 9–23)
BUN: 8 mg/dL (ref 6–24)
Bilirubin Total: 0.6 mg/dL (ref 0.0–1.2)
CO2: 24 mmol/L (ref 20–29)
Calcium: 8.9 mg/dL (ref 8.7–10.2)
Chloride: 101 mmol/L (ref 96–106)
Creatinine, Ser: 0.64 mg/dL (ref 0.57–1.00)
Globulin, Total: 3.5 g/dL (ref 1.5–4.5)
Glucose: 98 mg/dL (ref 70–99)
Potassium: 4.9 mmol/L (ref 3.5–5.2)
Sodium: 139 mmol/L (ref 134–144)
Total Protein: 7.8 g/dL (ref 6.0–8.5)
eGFR: 112 mL/min/{1.73_m2} (ref >59)

## 2023-12-10 LAB — LIPID PANEL
Chol/HDL Ratio: 3.6 {ratio} (ref 0.0–4.4)
Cholesterol, Total: 218 mg/dL — ABNORMAL HIGH (ref 100–199)
HDL: 61 mg/dL (ref >39)
LDL Chol Calc (NIH): 135 mg/dL — ABNORMAL HIGH (ref 0–99)
Triglycerides: 125 mg/dL (ref 0–149)
VLDL Cholesterol Cal: 22 mg/dL (ref 5–40)

## 2023-12-18 ENCOUNTER — Other Ambulatory Visit: Payer: Self-pay

## 2023-12-30 ENCOUNTER — Other Ambulatory Visit: Payer: Self-pay | Admitting: Nurse Practitioner

## 2023-12-30 DIAGNOSIS — E1165 Type 2 diabetes mellitus with hyperglycemia: Secondary | ICD-10-CM

## 2024-01-01 ENCOUNTER — Other Ambulatory Visit: Payer: Self-pay

## 2024-01-27 ENCOUNTER — Other Ambulatory Visit: Payer: Self-pay

## 2024-02-27 ENCOUNTER — Other Ambulatory Visit: Payer: Self-pay

## 2024-03-09 ENCOUNTER — Encounter: Payer: Self-pay | Admitting: Nurse Practitioner

## 2024-03-09 ENCOUNTER — Other Ambulatory Visit: Payer: Self-pay | Admitting: Nurse Practitioner

## 2024-03-09 ENCOUNTER — Ambulatory Visit: Payer: Self-pay | Attending: Nurse Practitioner | Admitting: Nurse Practitioner

## 2024-03-09 ENCOUNTER — Other Ambulatory Visit: Payer: Self-pay

## 2024-03-09 VITALS — BP 106/78 | HR 93 | Resp 19 | Ht 63.0 in | Wt 246.6 lb

## 2024-03-09 DIAGNOSIS — Z7984 Long term (current) use of oral hypoglycemic drugs: Secondary | ICD-10-CM

## 2024-03-09 DIAGNOSIS — E1165 Type 2 diabetes mellitus with hyperglycemia: Secondary | ICD-10-CM

## 2024-03-09 DIAGNOSIS — E785 Hyperlipidemia, unspecified: Secondary | ICD-10-CM

## 2024-03-09 DIAGNOSIS — Z1231 Encounter for screening mammogram for malignant neoplasm of breast: Secondary | ICD-10-CM

## 2024-03-09 DIAGNOSIS — E559 Vitamin D deficiency, unspecified: Secondary | ICD-10-CM

## 2024-03-09 DIAGNOSIS — R7989 Other specified abnormal findings of blood chemistry: Secondary | ICD-10-CM

## 2024-03-09 DIAGNOSIS — I10 Essential (primary) hypertension: Secondary | ICD-10-CM

## 2024-03-09 DIAGNOSIS — Z7985 Long-term (current) use of injectable non-insulin antidiabetic drugs: Secondary | ICD-10-CM

## 2024-03-09 LAB — POCT GLYCOSYLATED HEMOGLOBIN (HGB A1C): HbA1c, POC (controlled diabetic range): 6.3 % (ref 0.0–7.0)

## 2024-03-09 MED ORDER — METFORMIN HCL 500 MG PO TABS
500.0000 mg | ORAL_TABLET | Freq: Every day | ORAL | 1 refills | Status: DC
  Filled 2024-03-09: qty 90, 90d supply, fill #0
  Filled 2024-06-17: qty 90, 90d supply, fill #1

## 2024-03-09 MED ORDER — SEMAGLUTIDE (2 MG/DOSE) 8 MG/3ML ~~LOC~~ SOPN
2.0000 mg | PEN_INJECTOR | SUBCUTANEOUS | 6 refills | Status: DC
  Filled 2024-03-09 – 2024-03-16 (×3): qty 3, fill #0
  Filled 2024-03-18 (×3): qty 3, 28d supply, fill #0
  Filled 2024-04-15: qty 3, 28d supply, fill #1
  Filled 2024-05-06 – 2024-05-11 (×3): qty 3, 28d supply, fill #2
  Filled 2024-06-02: qty 3, 28d supply, fill #3
  Filled 2024-06-11 – 2024-07-07 (×2): qty 3, 28d supply, fill #4
  Filled 2024-07-31: qty 6, 56d supply, fill #5

## 2024-03-09 NOTE — Patient Instructions (Addendum)
 Hello, If she can send a copy of her bill front and back Fax# [phone]   Or   Email: [email or web address]

## 2024-03-09 NOTE — Progress Notes (Signed)
 Assessment & Plan:  Maria Daniel was seen today for medical management of chronic issues.  Diagnoses and all orders for this visit:  Type 2 diabetes mellitus with hyperglycemia, without long-term current use of insulin (HCC) -     POCT glycosylated hemoglobin (Hb A1C) -     Urine Albumin/Creatinine with ratio (send out) [LAB689] -     Semaglutide, 2 MG/DOSE, 8 MG/3ML SOPN; Inject 2 mg as directed once a week. -     metFORMIN (GLUCOPHAGE) 500 MG tablet; Take 1 tablet (500 mg total) by mouth daily with breakfast. FOR DIABETES Continue blood sugar control as discussed in office today, low carbohydrate diet, and regular physical exercise as tolerated, 150 minutes per week (30 min each day, 5 days per week, or 50 min 3 days per week). Keep blood sugar logs with fasting goal of 90-130 mg/dl, post prandial (after you eat) less than 180.  For Hypoglycemia: BS <60 and Hyperglycemia BS >400; contact the clinic ASAP. Annual eye exams and foot exams are recommended.   Breast cancer screening by mammogram -     MS 3D SCR MAMMO BILAT BR (aka MM); Future  Abnormal TSH -     Thyroid Panel With TSH  Vitamin D deficiency disease -     VITAMIN D 25 Hydroxy (Vit-D Deficiency, Fractures)  Primary Hypertension Continue all antihypertensives as prescribed.  Reminded to bring in blood pressure log for follow  up appointment.  RECOMMENDATIONS: DASH/Mediterranean Diets are healthier choices for HTN.     Patient has been counseled on age-appropriate routine health concerns for screening and prevention. These are reviewed and up-to-date. Referrals have been placed accordingly. Immunizations are up-to-date or declined.    Subjective:   Chief Complaint  Patient presents with   Medical Management of Chronic Issues    Maria Daniel 43 y.o. female presents to office today for follow up to DM2  She has a past medical history of DM2, Dyspnea, Gestational diabetes, Hypertension, Hyperthyroidism, Insomnia,  Knee pain, and Miscarriage.    DM 2 Diabetes is well controlled. She is currently taking ozempic 1mg  weekly. Stopped taking metformin 3 months ago due to it causing bitter taste in her mouth. She is also taking Jardiance 10 mg daily. Lab Results  Component Value Date   HGBA1C 6.3 03/09/2024    HTN Blood pressure is well controlled. She is taking valsartan 40 mg daily.  BP Readings from Last 3 Encounters:  03/09/24 106/78  12/09/23 115/76  09/02/23 122/81    Review of Systems  Constitutional:  Negative for fever, malaise/fatigue and weight loss.  HENT: Negative.  Negative for nosebleeds.   Eyes: Negative.  Negative for blurred vision, double vision and photophobia.  Respiratory: Negative.  Negative for cough and shortness of breath.   Cardiovascular: Negative.  Negative for chest pain, palpitations and leg swelling.  Gastrointestinal: Negative.  Negative for heartburn, nausea and vomiting.  Musculoskeletal: Negative.  Negative for myalgias.  Neurological: Negative.  Negative for dizziness, focal weakness, seizures and headaches.  Psychiatric/Behavioral: Negative.  Negative for suicidal ideas.     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   Diabetes mellitus without complication (HCC)    Dyspnea    Gestational diabetes    Hypertension    no meds patient denies   Hyperthyroidism    Insomnia    Knee pain  Miscarriage     Past Surgical History:  Procedure Laterality Date   CESAREAN SECTION     CESAREAN SECTION  08/17/2012   Procedure: CESAREAN SECTION;  Surgeon: Lesly Dukes, MD;  [facility];  Service: Obstetrics;  Laterality: N/A;   CESAREAN SECTION N/A 04/23/2016   Procedure: CESAREAN SECTION;  Surgeon: Catalina Antigua, MD;  [facility];  Service: Obstetrics;  Laterality: N/A;   CESAREAN SECTION N/A 07/28/2018   Procedure: REPEAT  CESAREAN SECTION;  Surgeon: Catalina Antigua, MD;  [facility];  Service: Obstetrics;  Laterality: N/A;   CESAREAN SECTION WITH BILATERAL TUBAL LIGATION Bilateral 07/28/2018   Procedure: BILATERAL TUBAL LIGATION;  Surgeon: Catalina Antigua, MD;  [facility];  Service: Obstetrics;  Laterality: Bilateral;   LESION REMOVAL N/A 11/11/2016   Procedure: EXCISION OF SUBCUTANEOUS LESION/ ABDOMEN;  Surgeon: Hermina Staggers, MD;  [facility];  Service: Gynecology;  Laterality: N/A;    Family History  Problem Relation Age of Onset   Hypertension Mother    Kidney disease Father    Multiple sclerosis Father    Breast cancer Neg Hx     Social History Reviewed with no changes to be made today.   Outpatient Medications Prior to Visit  Medication Sig Dispense Refill   atorvastatin (LIPITOR) 20 MG tablet Take 1 tablet (20 mg total) by mouth daily. FOR CHOLESTEROL 90 tablet 3   Blood Glucose Monitoring Suppl (TRUE METRIX METER) w/Device KIT Use as instructed. Check blood glucose level by fingerstick twice per day. 1 kit 0   glucose blood (TRUE METRIX BLOOD GLUCOSE TEST) test strip Use as instructed. Check blood glucose level by fingerstick twice per day. 100 each 12   JARDIANCE 10 MG TABS tablet Take one tablet by mouth daily before breakfast. 90 tablet 0   TRUEplus Lancets 28G MISC Use as instructed. Check blood glucose level by fingerstick twice per day. 100 each 3   valsartan (DIOVAN) 40 MG tablet Take 1 tablet (40 mg total) by mouth daily. 90 tablet 3   metFORMIN (GLUCOPHAGE) 500 MG tablet Take 2 tablets (1,000 mg total) by mouth 2 (two) times daily with a meal. FOR DIABETES 180 tablet 3   Semaglutide, 1 MG/DOSE, 4 MG/3ML SOPN Inject 1 mg as directed once a week. 9 mL 1   No facility-administered medications prior to visit.    No Known Allergies     Objective:    BP 106/78 (BP Location: Left Arm, Patient Position: Sitting, Cuff Size: Normal)   Pulse 93   Resp  19   Ht 5\' 3"  (1.6 m)   Wt 246 lb 9.6 oz (111.9 kg)   SpO2 97%   BMI 43.68 kg/m  Wt Readings from Last 3 Encounters:  03/09/24 246 lb 9.6 oz (111.9 kg)  12/09/23 250 lb 12.8 oz (113.8 kg)  09/02/23 253 lb 9.6 oz (115 kg)    Physical Exam Vitals and nursing note reviewed.  Constitutional:      Appearance: She is well-developed.  HENT:     Head: Normocephalic and atraumatic.  Cardiovascular:     Rate and Rhythm: Normal rate and regular rhythm.     Heart sounds: Normal heart sounds. No murmur heard.    No friction rub. No gallop.  Pulmonary:     Effort: Pulmonary effort is normal. No tachypnea or respiratory distress.     Breath sounds: Normal breath sounds. No decreased breath sounds, wheezing, rhonchi or rales.  Chest:  Chest wall: No tenderness.  Abdominal:     General: Bowel sounds are normal.     Palpations: Abdomen is soft.  Musculoskeletal:        General: Normal range of motion.     Cervical back: Normal range of motion.  Skin:    General: Skin is warm and dry.  Neurological:     Mental Status: She is alert and oriented to person, place, and time.     Coordination: Coordination normal.  Psychiatric:        Behavior: Behavior normal. Behavior is cooperative.        Thought Content: Thought content normal.        Judgment: Judgment normal.          Patient has been counseled extensively about nutrition and exercise as well as the importance of adherence with medications and regular follow-up. The patient was given clear instructions to go to ER or return to medical center if symptoms don't improve, worsen or new problems develop. The patient verbalized understanding.   Follow-up: Return in about 6 months (around 09/08/2024) for physical.   Collins Dean, FNP-BC [facility] [place], [place] [phone]   03/09/2024, 6:29 PM

## 2024-03-10 ENCOUNTER — Other Ambulatory Visit: Payer: Self-pay

## 2024-03-10 MED ORDER — ATORVASTATIN CALCIUM 20 MG PO TABS
20.0000 mg | ORAL_TABLET | Freq: Every day | ORAL | 3 refills | Status: AC
  Filled 2024-03-10: qty 90, 90d supply, fill #0
  Filled 2024-06-11: qty 90, 90d supply, fill #1
  Filled 2024-07-31 – 2024-09-10 (×2): qty 90, 90d supply, fill #2

## 2024-03-12 ENCOUNTER — Telehealth: Payer: Self-pay

## 2024-03-12 ENCOUNTER — Other Ambulatory Visit: Payer: Self-pay

## 2024-03-12 LAB — THYROID PANEL WITH TSH
Free Thyroxine Index: 2.5 (ref 1.2–4.9)
T3 Uptake Ratio: 23 % — ABNORMAL LOW (ref 24–39)
T4, Total: 11 ug/dL (ref 4.5–12.0)
TSH: 0.71 u[IU]/mL (ref 0.450–4.500)

## 2024-03-12 LAB — MICROALBUMIN / CREATININE URINE RATIO
Creatinine, Urine: 232.6 mg/dL
Microalb/Creat Ratio: 17 mg/g{creat} (ref 0–29)
Microalbumin, Urine: 40.5 ug/mL

## 2024-03-12 LAB — VITAMIN D 25 HYDROXY (VIT D DEFICIENCY, FRACTURES): Vit D, 25-Hydroxy: 10.7 ng/mL — ABNORMAL LOW (ref 30.0–100.0)

## 2024-03-12 NOTE — Telephone Encounter (Signed)
 Submitted application for OZEMPIC to [employer] for patient assistance.   Phone: [phone]

## 2024-03-16 ENCOUNTER — Encounter: Payer: Self-pay | Admitting: Nurse Practitioner

## 2024-03-16 ENCOUNTER — Other Ambulatory Visit: Payer: Self-pay | Admitting: Nurse Practitioner

## 2024-03-16 ENCOUNTER — Other Ambulatory Visit: Payer: Self-pay

## 2024-03-16 DIAGNOSIS — E559 Vitamin D deficiency, unspecified: Secondary | ICD-10-CM

## 2024-03-16 MED ORDER — VITAMIN D (ERGOCALCIFEROL) 1.25 MG (50000 UNIT) PO CAPS
50000.0000 [IU] | ORAL_CAPSULE | ORAL | 0 refills | Status: DC
  Filled 2024-03-16: qty 12, 84d supply, fill #0

## 2024-03-18 ENCOUNTER — Other Ambulatory Visit: Payer: Self-pay | Admitting: Pharmacist

## 2024-03-18 ENCOUNTER — Other Ambulatory Visit: Payer: Self-pay

## 2024-03-18 ENCOUNTER — Telehealth: Payer: Self-pay

## 2024-03-18 NOTE — Telephone Encounter (Signed)
 Received notification from [employer] regarding approval for OZEMPIC . Patient assistance approved from 03/11/2024 to 03/11/2025.  Medication will ship to [address], [id number]  Pt ID: [id number]  Company phone: [phone]

## 2024-03-25 ENCOUNTER — Telehealth: Payer: Self-pay

## 2024-03-25 NOTE — Telephone Encounter (Signed)
 Telephoned patient at mobile number using interpreter#[id number].Aaron Aas Left a voice message with [employer] (scholarship) contact information.

## 2024-04-15 ENCOUNTER — Other Ambulatory Visit: Payer: Self-pay

## 2024-05-07 ENCOUNTER — Other Ambulatory Visit (HOSPITAL_COMMUNITY): Payer: Self-pay

## 2024-05-07 ENCOUNTER — Other Ambulatory Visit: Payer: Self-pay

## 2024-05-11 ENCOUNTER — Other Ambulatory Visit: Payer: Self-pay

## 2024-05-12 ENCOUNTER — Other Ambulatory Visit: Payer: Self-pay

## 2024-05-13 ENCOUNTER — Other Ambulatory Visit: Payer: Self-pay

## 2024-05-17 ENCOUNTER — Other Ambulatory Visit: Payer: Self-pay

## 2024-06-02 ENCOUNTER — Other Ambulatory Visit: Payer: Self-pay

## 2024-06-03 ENCOUNTER — Telehealth (INDEPENDENT_AMBULATORY_CARE_PROVIDER_SITE_OTHER): Payer: Self-pay

## 2024-06-03 NOTE — Telephone Encounter (Signed)
 Contacted pt to schedule mammogram(for mm bus) pt didn't answer lvm  [employer] interpreter: Therisa ID: [id number]

## 2024-06-11 ENCOUNTER — Other Ambulatory Visit: Payer: Self-pay

## 2024-06-16 ENCOUNTER — Other Ambulatory Visit: Payer: Self-pay

## 2024-06-17 ENCOUNTER — Other Ambulatory Visit: Payer: Self-pay

## 2024-07-07 ENCOUNTER — Other Ambulatory Visit: Payer: Self-pay

## 2024-07-08 ENCOUNTER — Other Ambulatory Visit: Payer: Self-pay

## 2024-07-15 ENCOUNTER — Other Ambulatory Visit: Payer: Self-pay

## 2024-07-31 ENCOUNTER — Other Ambulatory Visit: Payer: Self-pay

## 2024-08-01 ENCOUNTER — Other Ambulatory Visit: Payer: Self-pay

## 2024-08-02 ENCOUNTER — Other Ambulatory Visit: Payer: Self-pay

## 2024-08-05 ENCOUNTER — Other Ambulatory Visit: Payer: Self-pay

## 2024-08-11 ENCOUNTER — Other Ambulatory Visit: Payer: Self-pay

## 2024-08-23 ENCOUNTER — Other Ambulatory Visit: Payer: Self-pay

## 2024-09-10 ENCOUNTER — Encounter: Payer: Self-pay | Admitting: Nurse Practitioner

## 2024-09-10 ENCOUNTER — Other Ambulatory Visit: Payer: Self-pay | Admitting: Nurse Practitioner

## 2024-09-10 ENCOUNTER — Other Ambulatory Visit: Payer: Self-pay

## 2024-09-10 DIAGNOSIS — E559 Vitamin D deficiency, unspecified: Secondary | ICD-10-CM

## 2024-09-10 DIAGNOSIS — E1165 Type 2 diabetes mellitus with hyperglycemia: Secondary | ICD-10-CM

## 2024-09-10 MED ORDER — METFORMIN HCL 500 MG PO TABS
500.0000 mg | ORAL_TABLET | Freq: Every day | ORAL | 0 refills | Status: AC
  Filled 2024-09-10: qty 90, 90d supply, fill #0

## 2024-09-10 MED ORDER — SEMAGLUTIDE (2 MG/DOSE) 8 MG/3ML ~~LOC~~ SOPN
2.0000 mg | PEN_INJECTOR | SUBCUTANEOUS | 2 refills | Status: AC
  Filled 2024-09-10 – 2024-09-15 (×2): qty 3, 28d supply, fill #0
  Filled 2024-10-25: qty 3, 28d supply, fill #1
  Filled 2024-12-03: qty 3, 28d supply, fill #2

## 2024-09-10 MED ORDER — VITAMIN D (ERGOCALCIFEROL) 1.25 MG (50000 UNIT) PO CAPS
50000.0000 [IU] | ORAL_CAPSULE | ORAL | 0 refills | Status: AC
  Filled 2024-09-10: qty 12, 84d supply, fill #0

## 2024-09-10 NOTE — Telephone Encounter (Signed)
 Copied from CRM #[id number]. Topic: Clinical - Medication Refill >> Sep 10, 2024 10:18 AM Berneda FALCON wrote: Medication:  Semaglutide , 2 MG/DOSE, 8 MG/3ML SOPN metFORMIN  (GLUCOPHAGE ) 500 MG tablet Vitamin D , Ergocalciferol , (DRISDOL ) 1.25 MG (50000 UNIT) CAPS capsule  *Ibuprofen  is not listed on her med list but she is also requesting this as well if possible. It is under discontinued meds-800 MG  Has the patient contacted their pharmacy? No (Agent: If no, request that the patient contact the pharmacy for the refill. If patient does not wish to contact the pharmacy document the reason why and proceed with request.) (Agent: If yes, when and what did the pharmacy advise?)  This is the patient's preferred pharmacy:  [facility] [address] [place] [address] Phone: [phone] Fax: [phone]  Is this the correct pharmacy for this prescription? Yes If no, delete pharmacy and type the correct one.   Has the prescription been filled recently? No  Is the patient out of the medication? No  Has the patient been seen for an appointment in the last year OR does the patient have an upcoming appointment? Yes  Can we respond through MyChart? Yes  Agent: Please be advised that Rx refills may take up to 3 business days. We ask that you follow-up with your pharmacy.

## 2024-09-15 ENCOUNTER — Other Ambulatory Visit: Payer: Self-pay

## 2024-09-21 ENCOUNTER — Other Ambulatory Visit: Payer: Self-pay

## 2024-09-22 ENCOUNTER — Other Ambulatory Visit: Payer: Self-pay

## 2024-09-23 ENCOUNTER — Other Ambulatory Visit: Payer: Self-pay

## 2024-10-25 ENCOUNTER — Other Ambulatory Visit: Payer: Self-pay

## 2024-10-26 ENCOUNTER — Other Ambulatory Visit: Payer: Self-pay

## 2024-11-01 ENCOUNTER — Other Ambulatory Visit: Payer: Self-pay

## 2024-11-05 ENCOUNTER — Other Ambulatory Visit: Payer: Self-pay

## 2024-11-17 ENCOUNTER — Other Ambulatory Visit: Payer: Self-pay | Admitting: Nurse Practitioner

## 2024-11-17 ENCOUNTER — Ambulatory Visit: Payer: Self-pay | Admitting: Nurse Practitioner

## 2024-11-17 DIAGNOSIS — E559 Vitamin D deficiency, unspecified: Secondary | ICD-10-CM

## 2024-11-17 NOTE — Telephone Encounter (Signed)
 Copied from CRM #[id number]. Topic: Clinical - Medication Refill >> Nov 17, 2024 11:01 AM Rosaria E wrote: Medication: Semaglutide , 2 MG/DOSE, 8 MG/3ML SOPN  Vitamin D , Ergocalciferol , (DRISDOL ) 1.25 MG (50000 UNIT) CAPS capsule  ibuprofen  (ADVIL ,MOTRIN ) 800 MG tablet  Has the patient contacted their pharmacy? Yes (Agent: If no, request that the patient contact the pharmacy for the refill. If patient does not wish to contact the pharmacy document the reason why and proceed with request.) (Agent: If yes, when and what did the pharmacy advise?)  This is the patient's preferred pharmacy:  [facility] [address] [place] [address] Phone: [phone] Fax: [phone]  Is this the correct pharmacy for this prescription? Yes If no, delete pharmacy and type the correct one.   Has the prescription been filled recently? Yes  Is the patient out of the medication? Yes  Has the patient been seen for an appointment in the last year OR does the patient have an upcoming appointment? Yes  Can we respond through MyChart? Yes  Agent: Please be advised that Rx refills may take up to 3 business days. We ask that you follow-up with your pharmacy.

## 2024-11-17 NOTE — Telephone Encounter (Signed)
 ibuprofen  (ADVIL ,MOTRIN ) 800 MG tablet   Semaglutide , 2 MG/DOSE, 8 MG/3ML SOPN   Not showing on active meds list but was previously prescribed

## 2024-11-19 NOTE — Telephone Encounter (Signed)
 Called but no answer. LVM to call back. Please see Vit D results & provider message for more information.

## 2024-11-19 NOTE — Telephone Encounter (Signed)
 Requested medication (s) are due for refill today: Yes  Requested medication (s) are on the active medication list: Yes  Last refill:  09/10/24  Future visit scheduled: Yes  Notes to clinic:  Manual review.    Requested Prescriptions  Pending Prescriptions Disp Refills   Vitamin D , Ergocalciferol , (DRISDOL ) 1.25 MG (50000 UNIT) CAPS capsule 12 capsule 0    Sig: Take 1 capsule (50,000 Units total) by mouth once a week.     Endocrinology:  Vitamins - Vitamin D  Supplementation 2 Failed - 11/19/2024  1:34 PM      Failed - Manual Review: Route requests for 50,000 IU strength to the provider      Failed - Vitamin D  in normal range and within 360 days    Vit D, 25-Hydroxy  Date Value Ref Range Status  03/09/2024 10.7 (L) 30.0 - 100.0 ng/mL Final    Comment:    Vitamin D  deficiency has been defined by the Institute of Medicine and an Endocrine Society practice guideline as a level of serum 25-OH vitamin D  less than 20 ng/mL (1,2). The Endocrine Society went on to further define vitamin D  insufficiency as a level between 21 and 29 ng/mL (2). 1. IOM (Institute of Medicine). 2010. Dietary reference    intakes for calcium  and D. [place]: The    [employer]. 2. Holick MF, Binkley [place], Bischoff-Ferrari HA, et al.    Evaluation, treatment, and prevention of vitamin D     deficiency: an Endocrine Society clinical practice    guideline. JCEM. 2011 Jul; 96(7):1911-30.          Passed - Ca in normal range and within 360 days    Calcium   Date Value Ref Range Status  12/09/2023 8.9 8.7 - 10.2 mg/dL Final         Passed - Valid encounter within last 12 months    Recent Outpatient Visits           8 months ago Type 2 diabetes mellitus with hyperglycemia, without long-term current use of insulin  [facility])   [facility] [place]. [facility] [place], [place] W, NP   11 months ago Type 2 diabetes mellitus with hyperglycemia, without  long-term current use of insulin  [facility])   [facility] [place]. [facility] [place], [place] W, NP   1 year ago Type 2 diabetes mellitus with hyperglycemia, without long-term current use of insulin  [facility])   [facility] [place]. [facility] [place] Theotis Haze ORN, NP   1 year ago Primary hypertension   [facility] [place]. [facility] Theotis Haze ORN, NP   1 year ago Primary hypertension   [facility] [place]. [facility] [place] Theotis Haze ORN, [place]

## 2024-12-03 ENCOUNTER — Ambulatory Visit: Payer: Self-pay | Attending: *Deleted | Admitting: *Deleted

## 2024-12-03 ENCOUNTER — Other Ambulatory Visit: Payer: Self-pay

## 2024-12-03 ENCOUNTER — Encounter: Payer: Self-pay | Admitting: *Deleted

## 2024-12-03 VITALS — BP 117/76 | HR 68 | Ht 63.0 in | Wt 241.2 lb

## 2024-12-03 DIAGNOSIS — R270 Ataxia, unspecified: Secondary | ICD-10-CM

## 2024-12-03 DIAGNOSIS — E1165 Type 2 diabetes mellitus with hyperglycemia: Secondary | ICD-10-CM

## 2024-12-03 LAB — POCT GLYCOSYLATED HEMOGLOBIN (HGB A1C): HbA1c, POC (controlled diabetic range): 6.4 % (ref 0.0–7.0)

## 2024-12-03 LAB — GLUCOSE, POCT (MANUAL RESULT ENTRY): POC Glucose: 90 mg/dL (ref 70–99)

## 2024-12-03 NOTE — Patient Instructions (Addendum)
 Today we discussed your ataxia (lack of muscle coordination causing clumsy, awkward movements that affect balance, walking, speech, swallowing and writing). You did report similar symptoms about a year ago that were evaluated in the emergency room but you are concerned that your symptoms have worsened. Because of the duration and recurrence of your symptoms I ordered a CT scan of your brain to look for a mass or other issue. I ordered lab work including a thyroid  panel, complete blood count, and a comprehensive metabolic panel. I did make a referral to the neurologist for further evaluation If if your symptoms worsen you should proceed to the emergency room or urgent care Keep follow-up appointment with primary care provider

## 2024-12-03 NOTE — Progress Notes (Signed)
 "   Patient ID: Maria Daniel, female    DOB: 08-Sep-1981  MRN: [id number]  CC: Medical Management of Chronic Issues (Problem walking X 1 month/Problem lifting X 1 moth/)   Subjective: Maria Daniel is a 44 y.o. female who presents evaluation of lack of muscle coordination with clumsy movements affecting balance, walking, speech and fine motor skills (writing).  She reports variable symptoms over the last 3 to 5 years (without clear diagnosis).    Symptoms are worse over the last 30 days. She has had some mild headaches, no blurry or double vision, no ringing or buzzing in her ears, no chest pain, shortness of breath or palpitations.  No symptom into an extremity that to her would suggest a stroke   Her concerns today include:  Evaluation of worsening symptoms of lack of muscle coordination   Patient Active Problem List   Diagnosis Date Noted   S/P cesarean section 07/28/2018   H/O gestational diabetes in prior pregnancy, currently pregnant 03/25/2018   Supervision of high risk pregnancy, antepartum, third trimester 03/25/2018   Language barrier 03/25/2018   Non compliance w medication regimen 12/01/2017   Hyperthyroidism 12/01/2017   Chronic hypertension during pregnancy, antepartum 12/04/2016   Thyroid  disease 12/02/2016   Borderline hypertension 04/15/2012   H/O: C-section 04/14/2012     Medications Ordered Prior to Encounter[1]  Allergies[2]  Social History   Socioeconomic History   Marital status: Married    Spouse name: Not on file   Number of children: Not on file   Years of education: Not on file   Highest education level: 7th grade  Occupational History   Not on file  Tobacco Use   Smoking status: Never   Smokeless tobacco: Never  Vaping Use   Vaping status: Never Used  Substance and Sexual Activity   Alcohol use: No    Comment: socially   Drug use: No   Sexual activity: Yes    Birth control/protection: None  Other Topics  Concern   Not on file  Social History Narrative   Not on file   Social Drivers of Health   Tobacco Use: Low Risk (12/03/2024)   Patient History    Smoking Tobacco Use: Never    Smokeless Tobacco Use: Never    Passive Exposure: Not on file  Financial Resource Strain: Medium Risk (12/09/2023)   Overall Financial Resource Strain (CARDIA)    Difficulty of Paying Living Expenses: Somewhat hard  Food Insecurity: Food Insecurity Present (12/09/2023)   Hunger Vital Sign    Worried About Running Out of Food in the Last Year: Sometimes true    Ran Out of Food in the Last Year: Sometimes true  Transportation Needs: Unmet Transportation Needs (12/09/2023)   PRAPARE - Transportation    Lack of Transportation (Medical): Yes    Lack of Transportation (Non-Medical): Yes  Physical Activity: Inactive (12/09/2023)   Exercise Vital Sign    Days of Exercise per Week: 0 days    Minutes of Exercise per Session: 0 min  Stress: Stress Concern Present (12/09/2023)   [employer] of Occupational Health - Occupational Stress Questionnaire    Feeling of Stress : Very much  Social Connections: Moderately Isolated (12/09/2023)   Social Connection and Isolation Panel    Frequency of Communication with Friends and Family: Once a week    Frequency of Social Gatherings with Friends and Family: Never    Attends Religious Services: 1 to 4 times per year    Active Member  of Clubs or Organizations: No    Attends Banker Meetings: Never    Marital Status: Married  Catering Manager Violence: Not At Risk (09/02/2023)   Humiliation, Afraid, Rape, and Kick questionnaire    Fear of Current or Ex-Partner: No    Emotionally Abused: No    Physically Abused: No    Sexually Abused: No  Depression (PHQ2-9): Medium Risk (12/09/2023)   Depression (PHQ2-9)    PHQ-2 Score: 6  Alcohol Screen: Low Risk (09/02/2023)   Alcohol Screen    Last Alcohol Screening Score (AUDIT): 0  Housing: Unknown (12/09/2023)   Housing  Stability Vital Sign    Unable to Pay for Housing in the Last Year: Not on file    Number of Times Moved in the Last Year: 1    Homeless in the Last Year: No  Utilities: Not At Risk (09/02/2023)   [employer] Utilities    Threatened with loss of utilities: No  Health Literacy: Inadequate Health Literacy (09/02/2023)   B1300 Health Literacy    Frequency of need for help with medical instructions: Sometimes    Family History  Problem Relation Age of Onset   Hypertension Mother    Kidney disease Father    Multiple sclerosis Father    Breast cancer Neg Hx     Past Surgical History:  Procedure Laterality Date   CESAREAN SECTION     CESAREAN SECTION  08/17/2012   Procedure: CESAREAN SECTION;  Surgeon: Burnard VEAR Pate, MD;  Location: [facility];  Service: Obstetrics;  Laterality: N/A;   CESAREAN SECTION N/A 04/23/2016   Procedure: CESAREAN SECTION;  Surgeon: Winton Felt, MD;  [facility]: [facility];  Service: Obstetrics;  Laterality: N/A;   CESAREAN SECTION N/A 07/28/2018   Procedure: REPEAT CESAREAN SECTION;  Surgeon: Felt Winton, MD;  [facility]: [facility];  Service: Obstetrics;  Laterality: N/A;   CESAREAN SECTION WITH BILATERAL TUBAL LIGATION Bilateral 07/28/2018   Procedure: BILATERAL TUBAL LIGATION;  Surgeon: Felt Winton, MD;  [facility]: [facility];  Service: Obstetrics;  Laterality: Bilateral;   LESION REMOVAL N/A 11/11/2016   Procedure: EXCISION OF SUBCUTANEOUS LESION/ ABDOMEN;  Surgeon: Ozell LITTIE Cowman, MD;  Location: [facility];  Service: Gynecology;  Laterality: N/A;    ROS: Review of Systems Negative except as stated above  PHYSICAL EXAM: BP 117/76   Pulse 68   Ht 5' 3 (1.6 m)   Wt 241 lb 3.2 oz (109.4 kg)   SpO2 98%   BMI 42.73 kg/m   Physical Exam Vitals and nursing note reviewed.  Constitutional:      Appearance: Normal appearance.  Cardiovascular:     Rate and Rhythm: Normal rate and regular rhythm.  Pulmonary:     Effort: Pulmonary effort is  normal.     Breath sounds: Normal breath sounds.  Abdominal:     General: Abdomen is flat. Bowel sounds are normal.     Palpations: Abdomen is soft.  Musculoskeletal:     Comments: Moves all extremities x 4  Skin:    General: Skin is warm and dry.  Neurological:     Mental Status: She is alert and oriented to person, place, and time. Mental status is at baseline.     Gait: Gait abnormal.     Comments: Cranial nerves II through XII are intact with good reflexes and sensation .  motor function in upper extremities is normal.  Gait ataxic          Latest Ref Rng &  Units 12/09/2023    5:03 PM 09/02/2023    4:58 PM 12/31/2022    9:48 AM  CMP  Glucose 70 - 99 mg/dL 98  755  801   BUN 6 - 24 mg/dL 8  11  11    Creatinine 0.57 - 1.00 mg/dL 9.35  9.27  9.24   Sodium 134 - 144 mmol/L 139  138  136   Potassium 3.5 - 5.2 mmol/L 4.9  4.6  4.0   Chloride 96 - 106 mmol/L 101  99  95   CO2 20 - 29 mmol/L 24  24  23    Calcium  8.7 - 10.2 mg/dL 8.9  9.5  9.5   Total Protein 6.0 - 8.5 g/dL 7.8  8.1  8.2   Total Bilirubin 0.0 - 1.2 mg/dL 0.6  0.4  0.5   Alkaline Phos 44 - 121 IU/L 82  92  86   AST 0 - 40 IU/L 32  40  32   ALT 0 - 32 IU/L 27  43  36    Lipid Panel     Component Value Date/Time   CHOL 218 (H) 12/09/2023 1703   TRIG 125 12/09/2023 1703   HDL 61 12/09/2023 1703   CHOLHDL 3.6 12/09/2023 1703   LDLCALC 135 (H) 12/09/2023 1703    CBC    Component Value Date/Time   WBC 9.2 09/02/2023 1658   WBC 9.1 09/20/2020 1650   RBC 5.36 (H) 09/02/2023 1658   RBC 4.48 09/20/2020 1650   HGB 14.1 09/02/2023 1658   HGB 10.8 04/06/2012 0000   HCT 43.9 09/02/2023 1658   HCT 35 04/06/2012 0000   PLT 403 09/02/2023 1658   PLT 342 04/06/2012 0000   MCV 82 09/02/2023 1658   MCH 26.3 (L) 09/02/2023 1658   MCH 26.3 09/20/2020 1650   MCHC 32.1 09/02/2023 1658   MCHC 31.6 09/20/2020 1650   RDW 13.1 09/02/2023 1658   LYMPHSABS 3.5 (H) 09/02/2023 1658   MONOABS 0.9 09/16/2020 2244   EOSABS  0.2 09/02/2023 1658   BASOSABS 0.1 09/02/2023 1658    Results for orders placed or performed in visit on 12/03/24  POCT glycosylated hemoglobin (Hb A1C)   Collection Time: 12/03/24  4:27 PM  Result Value Ref Range   Hemoglobin A1C     HbA1c POC (<> result, manual entry)     HbA1c, POC (prediabetic range)     HbA1c, POC (controlled diabetic range) 6.4 0.0 - 7.0 %  POCT glucose (manual entry)   Collection Time: 12/03/24  4:27 PM  Result Value Ref Range   POC Glucose 90 70 - 99 mg/dl     ASSESSMENT AND PLAN:  Assessment & Plan Type 2 diabetes mellitus with hyperglycemia, without long-term current use of insulin  (HCC) Point-of-care testing glucose 90 with a A1c of 6.4 She will continue medications as previously ordered.  She is encouraged to adhere to a low carbohydrate diet and exercise daily  Orders:   POCT glycosylated hemoglobin (Hb A1C)   POCT glucose (manual entry)  Ataxia These symptoms have been present for the last 3 to 5 years and worsened over the last month to now include difficulty with muscle coordination leading to clumsy awkward movements affecting balance, walking, speech, swallowing and fine motor skills (writing) Because of the persistent and worsening nature of the symptoms will check for thyroid  disease, or other hematologic abnormality. Okay for brain scan  referral to neurology for further evaluation and treatment Orders:   Thyroid  Panel  With TSH   CBC with Differential   Comprehensive metabolic panel with GFR   Ambulatory referral to Neurology   CT HEAD W & WO CONTRAST ( )       Patient was given the opportunity to ask questions.  Patient verbalized understanding of the plan and was able to repeat key elements of the plan.   This documentation was completed using Paediatric nurse.  Any transcriptional errors are unintentional.     Requested Prescriptions    No prescriptions requested or ordered in this encounter     Return in about 3 months (around 03/03/2025) for with PCP.  Landry Lookingbill H, NP     [1]  Current Outpatient Medications on File Prior to Visit  Medication Sig Dispense Refill   Semaglutide , 2 MG/DOSE, 8 MG/3ML SOPN Inject 2 mg as directed once a week. 3 mL 2   valsartan  (DIOVAN ) 40 MG tablet Take 1 tablet (40 mg total) by mouth daily. 90 tablet 3   atorvastatin  (LIPITOR) 20 MG tablet Take 1 tablet (20 mg total) by mouth daily. FOR CHOLESTEROL (Patient not taking: Reported on 12/03/2024) 90 tablet 3   Blood Glucose Monitoring Suppl (TRUE METRIX METER) w/Device KIT Use as instructed. Check blood glucose level by fingerstick twice per day. (Patient not taking: Reported on 12/03/2024) 1 kit 0   glucose blood (TRUE METRIX BLOOD GLUCOSE TEST) test strip Use as instructed. Check blood glucose level by fingerstick twice per day. (Patient not taking: Reported on 12/03/2024) 100 each 12   JARDIANCE  10 MG TABS tablet Take one tablet by mouth daily before breakfast. (Patient not taking: Reported on 12/03/2024) 90 tablet 0   metFORMIN  (GLUCOPHAGE ) 500 MG tablet Take 1 tablet (500 mg total) by mouth daily with breakfast. FOR DIABETES. (Patient not taking: Reported on 12/03/2024) 90 tablet 0   TRUEplus Lancets 28G MISC Use as instructed. Check blood glucose level by fingerstick twice per day. (Patient not taking: Reported on 12/03/2024) 100 each 3   Vitamin D , Ergocalciferol , (DRISDOL ) 1.25 MG (50000 UNIT) CAPS capsule Take 1 capsule (50,000 Units total) by mouth once a week. (Patient not taking: Reported on 12/03/2024) 12 capsule 0   No current facility-administered medications on file prior to visit.  [2] No Known Allergies  "

## 2024-12-06 ENCOUNTER — Ambulatory Visit: Payer: Self-pay | Attending: Nurse Practitioner

## 2024-12-07 ENCOUNTER — Ambulatory Visit: Payer: Self-pay | Admitting: *Deleted

## 2024-12-07 LAB — CBC WITH DIFFERENTIAL/PLATELET
Basophils Absolute: 0.1 x10E3/uL (ref 0.0–0.2)
Basos: 1 %
EOS (ABSOLUTE): 0.2 x10E3/uL (ref 0.0–0.4)
Eos: 2 %
Hematocrit: 42.6 % (ref 34.0–46.6)
Hemoglobin: 13.7 g/dL (ref 11.1–15.9)
Immature Grans (Abs): 0 x10E3/uL (ref 0.0–0.1)
Immature Granulocytes: 0 %
Lymphocytes Absolute: 3.5 x10E3/uL — ABNORMAL HIGH (ref 0.7–3.1)
Lymphs: 37 %
MCH: 27.2 pg (ref 26.6–33.0)
MCHC: 32.2 g/dL (ref 31.5–35.7)
MCV: 85 fL (ref 79–97)
Monocytes Absolute: 0.6 x10E3/uL (ref 0.1–0.9)
Monocytes: 6 %
Neutrophils Absolute: 4.9 x10E3/uL (ref 1.4–7.0)
Neutrophils: 54 %
Platelets: 403 x10E3/uL (ref 150–450)
RBC: 5.03 x10E6/uL (ref 3.77–5.28)
RDW: 13 % (ref 11.7–15.4)
WBC: 9.2 x10E3/uL (ref 3.4–10.8)

## 2024-12-07 LAB — COMPREHENSIVE METABOLIC PANEL WITH GFR
ALT: 18 IU/L (ref 0–32)
AST: 19 IU/L (ref 0–40)
Albumin: 4.4 g/dL (ref 3.9–4.9)
Alkaline Phosphatase: 71 IU/L (ref 41–116)
BUN/Creatinine Ratio: 16 (ref 9–23)
BUN: 11 mg/dL (ref 6–24)
Bilirubin Total: 0.7 mg/dL (ref 0.0–1.2)
CO2: 22 mmol/L (ref 20–29)
Calcium: 9.9 mg/dL (ref 8.7–10.2)
Chloride: 103 mmol/L (ref 96–106)
Creatinine, Ser: 0.69 mg/dL (ref 0.57–1.00)
Globulin, Total: 3.3 g/dL (ref 1.5–4.5)
Glucose: 91 mg/dL (ref 70–99)
Potassium: 5 mmol/L (ref 3.5–5.2)
Sodium: 139 mmol/L (ref 134–144)
Total Protein: 7.7 g/dL (ref 6.0–8.5)
eGFR: 109 mL/min/1.73

## 2024-12-07 LAB — THYROID PANEL WITH TSH
Free Thyroxine Index: 2.8 (ref 1.2–4.9)
T3 Uptake Ratio: 25 % (ref 24–39)
T4, Total: 11.3 ug/dL (ref 4.5–12.0)
TSH: 1.04 u[IU]/mL (ref 0.450–4.500)

## 2024-12-07 NOTE — Progress Notes (Unsigned)
 "  Initial neurology clinic note  Reason for Evaluation: Consultation requested by Placey, Ronal Caldron, NP for an opinion regarding muscle weakness. My final recommendations will be communicated back to the requesting physician by way of shared medical record or letter to requesting physician via [place]  mail.  HPI: This is [name], a 44 y.o. right-handed female with a medical history of hyperthyroidism, HTN, HLD, DM, vit D deficiency who presents to neurology clinic with the chief complaint of muscle weakness. The patient is accompanied by husband and Spanish interpretor.  Patient is having difficulty walking. She cannot walk far then feels like her legs aren't working correctly. Symptoms started with leg weakness in 2013. She first noticed difficulty walking up steps. She has to hold on to something to pull herself up a stair after getting one foot up.   Symptoms went away for a while, then returned in 2018 with the same symptoms. She would also noticed getting tongue twisted and being difficult to understand. She sought medical attention due to these symptoms and loss of 100 lbs. She was told she may have cancer and to go to the ED in 2018. She did not ever get checked out as recommended. She saw another PCP and said there was some confusion about testing and she had a thyroid  problem not cancer. Her symptoms went away again. Her symptoms would typically last about 1 year prior to resolving.  Her symptoms returned about 6-8 months ago and are worse. She states she cannot walk (though she walked into the appointment). She mentions if she tries to walk fast, she will get tripped up. If the floor is uneven, it is worse. She has difficulty holding a pen and her handwriting is not good. If she tries to hold a glass of water, she has no strength and will drop it.  She also endorses blurry vision and feels like she does not see well out of her right eye. She mentions that if she closes her  left eye, she cannot see. She has not seen an eye doctor.  She can have sore muscles after activity. She has some pain in her hands. She has tingling in her legs. It is about the same on both sides. She thinks the left leg is weaker.  She denies significant difficulty chewing or swallowing. When talking a lot, she can lose her voice.  She is worried about her symptoms because her father was diagnosed with Parkinson's disease, MS, and cerebellar atrophy in [place]. He was not able to move.  Of note, patient has a CT head scheduled for 12/14/24.  The patient has not noticed any recent skin rashes nor does she does not report any constitutional symptoms like fever, night sweats, anorexia or unintentional weight loss. Her weight is stable for many years.  EtOH use: no  Restrictive diet? no   MEDICATIONS:  Outpatient Encounter Medications as of 12/09/2024  Medication Sig   Semaglutide , 2 MG/DOSE, 8 MG/3ML SOPN Inject 2 mg as directed once a week.   valsartan  (DIOVAN ) 40 MG tablet Take 1 tablet (40 mg total) by mouth daily.   atorvastatin  (LIPITOR) 20 MG tablet Take 1 tablet (20 mg total) by mouth daily. FOR CHOLESTEROL (Patient not taking: Reported on 12/09/2024)   Blood Glucose Monitoring Suppl (TRUE METRIX METER) w/Device KIT Use as instructed. Check blood glucose level by fingerstick twice per day. (Patient not taking: Reported on 12/09/2024)   glucose blood (TRUE METRIX BLOOD GLUCOSE TEST) test strip Use as  instructed. Check blood glucose level by fingerstick twice per day. (Patient not taking: Reported on 12/09/2024)   JARDIANCE  10 MG TABS tablet Take one tablet by mouth daily before breakfast. (Patient not taking: Reported on 12/09/2024)   metFORMIN  (GLUCOPHAGE ) 500 MG tablet Take 1 tablet (500 mg total) by mouth daily with breakfast. FOR DIABETES. (Patient not taking: Reported on 12/09/2024)   TRUEplus Lancets 28G MISC Use as instructed. Check blood glucose level by fingerstick twice per  day. (Patient not taking: Reported on 12/09/2024)   Vitamin D , Ergocalciferol , (DRISDOL ) 1.25 MG (50000 UNIT) CAPS capsule Take 1 capsule (50,000 Units total) by mouth once a week. (Patient not taking: Reported on 12/09/2024)   No facility-administered encounter medications on file as of 12/09/2024.    PAST MEDICAL HISTORY: 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   Diabetes mellitus without complication (HCC)    Dyspnea    Gestational diabetes    Hypertension    no meds patient denies   Hyperthyroidism    Insomnia    Knee pain    Miscarriage     PAST SURGICAL HISTORY: Past Surgical History:  Procedure Laterality Date   CESAREAN SECTION     CESAREAN SECTION  08/17/2012   Procedure: CESAREAN SECTION;  Surgeon: Burnard VEAR Pate, MD;  Location: [facility];  Service: Obstetrics;  Laterality: N/A;   CESAREAN SECTION N/A 04/23/2016   Procedure: CESAREAN SECTION;  Surgeon: Winton Felt, MD;  [facility];  Service: Obstetrics;  Laterality: N/A;   CESAREAN SECTION N/A 07/28/2018   Procedure: REPEAT CESAREAN SECTION;  Surgeon: Felt Winton, MD;  [facility];  Service: Obstetrics;  Laterality: N/A;   CESAREAN SECTION WITH BILATERAL TUBAL LIGATION Bilateral 07/28/2018   Procedure: BILATERAL TUBAL LIGATION;  Surgeon: Felt Winton, MD;  [facility];  Service: Obstetrics;  Laterality: Bilateral;   LESION REMOVAL N/A 11/11/2016   Procedure: EXCISION OF SUBCUTANEOUS LESION/ ABDOMEN;  Surgeon: Ozell LITTIE Cowman, MD;  Location: [facility];  Service: Gynecology;  Laterality: N/A;    ALLERGIES: Allergies[1]  FAMILY HISTORY: Family History  Problem Relation Age of Onset   Hypertension Mother    Kidney disease Father    Multiple sclerosis Father    Breast cancer Neg Hx     SOCIAL HISTORY: Social History[2] Social History   Social  History Narrative   Are you right handed or left handed? Right   Are you currently employed ? no   What is your current occupation?   Do you live at home alone?   Who lives with you? husband   What type of home do you live in: 1 story or 2 story? one    Caffiene 1 cup     OBJECTIVE: PHYSICAL EXAM: BP 134/86   Pulse 70   Ht 5' 3 (1.6 m)   Wt 240 lb (108.9 kg)   SpO2 99%   BMI 42.51 kg/m   General: General appearance: Awake and alert. No distress. Cooperative with exam.  Skin: No obvious rash or jaundice. HEENT: Atraumatic. Anicteric. Lungs: Non-labored breathing on room air  Heart: Regular Extremities: No obvious deformity.  Psych: Affect appropriate.  Neurological: Mental Status: Alert. Speech fluent. No pseudobulbar affect Cranial Nerves: CNII: No RAPD. Visual fields grossly intact. CNIII, IV, VI: PERRL. No nystagmus. EOMI. CN V: Facial sensation intact bilaterally to fine touch.  CN VII: Facial muscles symmetric and strong. No ptosis at rest or with sustained up gaze. CN VIII: Hearing grossly intact bilaterally. CN IX: No hypophonia. CN X: Palate elevates symmetrically. CN XI: Full strength shoulder shrug bilaterally. CN XII: Tongue protrusion full and midline. No atrophy or fasciculations. No significant dysarthria Motor: Tone is normal. No atrophy.  Individual muscle group testing (MRC grade out of 5):  Movement     Neck flexion 5    Neck extension 5     Right Left   Shoulder abduction 5 5   Elbow flexion 5 5   Elbow extension 5 5   Finger abduction - FDI 5 5   Finger abduction - ADM 5 5   Finger extension 5 5   Finger distal flexion - 2/3 5 5    Finger distal flexion - 4/5 5 5    Thumb flexion - FPL 5 5   Thumb abduction - APB 5 5    Hip flexion 5 5   Hip extension 5 5   Hip adduction 5 5   Hip abduction 5 5   Knee extension 5 5   Knee flexion 5 5   Dorsiflexion 5 5   Plantarflexion 5 5     Reflexes:  Right Left   Bicep 2+ 2+   Tricep  2+ 2+   BrRad 2+ 2+   Knee 2+ 2+   Ankle 1+ 1+    Pathological Reflexes: Babinski: flexor response bilaterally Hoffman: absent bilaterally Troemner: absent bilaterally Sensation: Pinprick: Intact in all extremities Vibration: Intact in all extremities Proprioception: Intact in all extremities Coordination: Intact finger-to- nose-finger with mild dysmetria bilaterally. RAM normal on right, with dysdiadochokinesia on left. Romberg negative. Gait: Able to rise from chair with arms crossed unassisted. Narrow based, waddling gait (?related to body habitus vs ataxia). Can walk unassisted though slow and cautious.  Lab and Test Review: Internal labs: 12/06/24: CMP unremarkable CBC w/ diff unremarkable TSH wnl T3, T4 wnl  HbA1c (12/03/24): 6.4 HbA1c (09/02/23): 8.6 Vit D (03/09/24): low at 10.7 Lipid panel (12/09/23): tChol 218, LDL 135, TG 125 RPR (07/25/18): non-reactive HIV (04/27/18): non-reactive  ASSESSMENT: Maria Daniel is a 44 y.o. female who presents for evaluation of weakness and incoordination. She has a relevant medical history of hyperthyroidism, HTN, HLD, DM, vit D deficiency. Her neurological examination is pertinent for mild dysmetria and dysdiadochokinesia on left. Her gait is not normal though this could be body habitus vs ataxia. Available diagnostic data is significant for HbA1c as high as 8.6 in 2024, vit D low to 10.7 in 02/2024.   This is a complex case due to my poor understanding of her history. Language may be a barrier, but her symptoms are odd, seem to come for a time, then go away for years, only to return again. There are elements of proximal muscle weakness mentioned in the history, but her exam is normal, making myopathy less likely. I considered a sensory ataxia given her history of DM, but her sensation and reflexes are unremarkable. She does have some possible cerebellar signs on examination. She also mentions her father had many neurologic diagnoses, so  it is possible this could be a genetic problem relating to cerebellar ataxia. Given the diagnostic uncertainty, I will cast a wide net with blood work to look for treatable causes and will get an MRI of her brain instead of CT. I considered an EMG, but given her exam, I think this would be low yield.  PLAN: -Blood  work: vit E, copper, B1, B12, MG panel, CK -Will cancel CT head and get MRI instead -MRI brain w/wo contrast -Recommend vit D supplementation, at least 1000 international units daily  -Return to clinic to be determined  The impression above as well as the plan as outlined below were extensively discussed with the patient (in the company of husband) who voiced understanding. All questions were answered to their satisfaction.  The patient was counseled on pertinent fall precautions per the printed material provided today, and as noted under the Patient Instructions section below.  When available, results of the above investigations and possible further recommendations will be communicated to the patient via telephone/MyChart. Patient to call office if not contacted after expected testing turnaround time.   Total time spent reviewing records, interview, history/exam, documentation, and coordination of care on day of encounter:  65 min   Thank you for allowing me to participate in patient's care.  If I can answer any additional questions, I would be pleased to do so.  Venetia Potters, MD   CC: Theotis Haze ORN, NP [address] [place] [address] [place] [address]  CC: Referring provider: Scarlett Ronal Caldron, NP [address] [place],  [place] [address]     [1] No Known Allergies [2]  Social History Tobacco Use   Smoking status: Never   Smokeless tobacco: Never  Vaping Use   Vaping status: Never Used  Substance Use Topics   Alcohol use: No    Comment: socially   Drug use: No   "

## 2024-12-09 ENCOUNTER — Encounter: Payer: Self-pay | Admitting: Neurology

## 2024-12-09 ENCOUNTER — Ambulatory Visit: Payer: Self-pay | Admitting: Neurology

## 2024-12-09 ENCOUNTER — Other Ambulatory Visit: Payer: Self-pay

## 2024-12-09 VITALS — BP 134/86 | HR 70 | Ht 63.0 in | Wt 240.0 lb

## 2024-12-09 DIAGNOSIS — R270 Ataxia, unspecified: Secondary | ICD-10-CM

## 2024-12-09 DIAGNOSIS — R531 Weakness: Secondary | ICD-10-CM

## 2024-12-09 NOTE — Patient Instructions (Addendum)
 Lab work today  No CT of your head. We will get an MRI of your brain instead. Please let me know if someone does not call to schedule in 1-2 weeks.  I will be in touch when I have your results to discuss next steps.  The physicians and staff at [facility] are committed to providing excellent care. You may receive a survey requesting feedback about your experience at our office. We strive to receive very good responses to the survey questions. If you feel that your experience would prevent you from giving the office a very good  response, please contact our office to try to remedy the situation. We may be reached at [phone]. Thank you for taking the time out of your busy day to complete the survey.  Venetia Potters, MD [facility]  Preventing Falls at [facility] are common, often dreaded events in the lives of older people. Aside from the obvious injuries and even death that may result, fall can cause wide-ranging consequences including loss of independence, mental decline, decreased activity and mobility. Younger people are also at risk of falling, especially those with chronic illnesses and fatigue.  Ways to reduce risk for falling Examine diet and medications. Warm foods and alcohol dilate blood vessels, which can lead to dizziness when standing. Sleep aids, antidepressants and pain medications can also increase the likelihood of a fall.  Get a vision exam. Poor vision, cataracts and glaucoma increase the chances of falling.  Check foot gear. Shoes should fit snugly and have a sturdy, nonskid sole and a broad, low heel  Participate in a physician-approved exercise program to build and maintain muscle strength and improve balance and coordination. Programs that use ankle weights or stretch bands are excellent for muscle-strengthening. Water aerobics programs and low-impact Tai Chi programs have also been shown to improve balance and coordination.  Increase vitamin D   intake. Vitamin D  improves muscle strength and increases the amount of calcium  the body is able to absorb and deposit in bones.  How to prevent falls from common hazards Floors - Remove all loose wires, cords, and throw rugs. Minimize clutter. Make sure rugs are anchored and smooth. Keep furniture in its usual place.  Chairs -- Use chairs with straight backs, armrests and firm seats. Add firm cushions to existing pieces to add height.  Bathroom - Install grab bars and non-skid tape in the tub or shower. Use a bathtub transfer bench or a shower chair with a back support Use an elevated toilet seat and/or safety rails to assist standing from a low surface. Do not use towel racks or bathroom tissue holders to help you stand.  Lighting - Make sure halls, stairways, and entrances are well-lit. Install a night light in your bathroom or hallway. Make sure there is a light switch at the top and bottom of the staircase. Turn lights on if you get up in the middle of the night. Make sure lamps or light switches are within reach of the bed if you have to get up during the night.  Kitchen - Install non-skid rubber mats near the sink and stove. Clean spills immediately. Store frequently used utensils, pots, pans between waist and eye level. This helps prevent reaching and bending. Sit when getting things out of lower cupboards.  Living room/ Bedrooms - Place furniture with wide spaces in between, giving enough room to move around. Establish a route through the living room that gives you something to hold onto as you walk.  Stairs - Make sure treads, rails, and rugs are secure. Install a rail on both sides of the stairs. If stairs are a threat, it might be helpful to arrange most of your activities on the lower level to reduce the number of times you must climb the stairs.  Entrances and doorways - Install metal handles on the walls adjacent to the doorknobs of all doors to make it more secure as you travel  through the doorway.  Tips for maintaining balance Keep at least one hand free at all times. Try using a backpack or fanny pack to hold things rather than carrying them in your hands. Never carry objects in both hands when walking as this interferes with keeping your balance.  Attempt to swing both arms from front to back while walking. This might require a conscious effort if Parkinson's disease has diminished your movement. It will, however, help you to maintain balance and posture, and reduce fatigue.  Consciously lift your feet off of the ground when walking. Shuffling and dragging of the feet is a common culprit in losing your balance.  When trying to navigate turns, use a U technique of facing forward and making a wide turn, rather than pivoting sharply.  Try to stand with your feet shoulder-length apart. When your feet are close together for any length of time, you increase your risk of losing your balance and falling.  Do one thing at a time. Don't try to walk and accomplish another task, such as reading or looking around. The decrease in your automatic reflexes complicates motor function, so the less distraction, the better.  Do not wear rubber or gripping soled shoes, they might catch on the floor and cause tripping.  Move slowly when changing positions. Use deliberate, concentrated movements and, if needed, use a grab bar or walking aid. Count 15 seconds between each movement. For example, when rising from a seated position, wait 15 seconds after standing to begin walking.  If balance is a continuous problem, you might want to consider a walking aid such as a cane, walking stick, or walker. Once you've mastered walking with help, you might be ready to try it on your own again.

## 2024-12-10 ENCOUNTER — Telehealth: Payer: Self-pay | Admitting: Nurse Practitioner

## 2024-12-10 NOTE — Telephone Encounter (Signed)
 Copied from CRM [id number]. Topic: General - Other >> Dec 10, 2024  8:34 AM   Victoria B wrote:  Reason for CRM: Renee from [facility] called in states, a Dr Leigh, patients specialist, cancelled the ct head scan, just wants to do the mri

## 2024-12-10 NOTE — Telephone Encounter (Signed)
 Responded to my chart.

## 2024-12-10 NOTE — Telephone Encounter (Signed)
 Copied from CRM [id number]. Topic: Appointments - Scheduling Inquiry for Clinic >> Dec 10, 2024 12:45 PM   Maria Daniel wrote:  Reason for CRM: Patient calling to advise that Dr Leigh ordered an MRI instead of a CT. Advised the order was there and the CT was canceled. States Dr Leigh advised to contact her PCP to get scheduled. Would like 1/20 if possible.  Patient can be reached at [phone]

## 2024-12-14 ENCOUNTER — Ambulatory Visit (HOSPITAL_COMMUNITY): Payer: Self-pay

## 2024-12-17 ENCOUNTER — Ambulatory Visit: Payer: Self-pay | Admitting: Neurology

## 2024-12-17 LAB — MYASTHENIA GRAVIS PANEL 2
A CHR BINDING ABS: <0.3 nmol/L
ACHR Blocking Abs: <15 %{inhibition}
Acetylchol Modul Ab: <1 %{inhibition}

## 2024-12-17 LAB — VITAMIN B1: Vitamin B1 (Thiamine): <6 nmol/L — ABNORMAL LOW (ref 8–30)

## 2024-12-17 LAB — VITAMIN E
Gamma-Tocopherol (Vit E): 3.7 mg/L
Vitamin E (Alpha Tocopherol): 11 mg/L (ref 5.7–19.9)

## 2024-12-17 LAB — VITAMIN B12: Vitamin B-12: 493 pg/mL (ref 200–1100)

## 2024-12-17 LAB — CK: Total CK: 37 U/L (ref 20–239)

## 2024-12-17 LAB — COPPER, SERUM: Copper: 133 ug/dL (ref 70–175)

## 2024-12-22 ENCOUNTER — Ambulatory Visit (HOSPITAL_COMMUNITY): Admission: RE | Admit: 2024-12-22 | Payer: Self-pay | Source: Ambulatory Visit

## 2024-12-26 ENCOUNTER — Ambulatory Visit (HOSPITAL_COMMUNITY): Admission: RE | Admit: 2024-12-26 | Payer: Self-pay | Source: Ambulatory Visit

## 2024-12-27 ENCOUNTER — Ambulatory Visit (HOSPITAL_COMMUNITY): Admission: RE | Admit: 2024-12-27 | Payer: Self-pay

## 2024-12-28 ENCOUNTER — Ambulatory Visit (HOSPITAL_COMMUNITY)
Admission: RE | Admit: 2024-12-28 | Discharge: 2024-12-28 | Disposition: A | Payer: Self-pay | Source: Ambulatory Visit | Attending: Neurology

## 2024-12-28 DIAGNOSIS — R531 Weakness: Secondary | ICD-10-CM

## 2024-12-28 DIAGNOSIS — R270 Ataxia, unspecified: Secondary | ICD-10-CM

## 2024-12-28 MED ORDER — GADOBUTROL 1 MMOL/ML IV SOLN
10.0000 mL | Freq: Once | INTRAVENOUS | Status: AC | PRN
  Administered 2024-12-28: 10 mL via INTRAVENOUS

## 2025-01-05 ENCOUNTER — Ambulatory Visit: Payer: Self-pay | Admitting: Nurse Practitioner

## 2025-03-23 ENCOUNTER — Ambulatory Visit: Payer: Self-pay | Admitting: Nurse Practitioner
